# Patient Record
Sex: Male | Born: 1935 | Race: Black or African American | Hispanic: No | Marital: Single | Smoking: Former smoker
Health system: Southern US, Community
[De-identification: ages and names within clinical notes are randomized; demographics above are authoritative.]

## PROBLEM LIST (undated history)

## (undated) DIAGNOSIS — N529 Male erectile dysfunction, unspecified: Secondary | ICD-10-CM

## (undated) DIAGNOSIS — R569 Unspecified convulsions: Secondary | ICD-10-CM

## (undated) DIAGNOSIS — I2692 Saddle embolus of pulmonary artery without acute cor pulmonale: Secondary | ICD-10-CM

## (undated) DIAGNOSIS — G4700 Insomnia, unspecified: Secondary | ICD-10-CM

## (undated) DIAGNOSIS — I2699 Other pulmonary embolism without acute cor pulmonale: Secondary | ICD-10-CM

## (undated) DIAGNOSIS — E7800 Pure hypercholesterolemia, unspecified: Secondary | ICD-10-CM

## (undated) DIAGNOSIS — D649 Anemia, unspecified: Secondary | ICD-10-CM

## (undated) DIAGNOSIS — M542 Cervicalgia: Secondary | ICD-10-CM

## (undated) DIAGNOSIS — Z9889 Other specified postprocedural states: Secondary | ICD-10-CM

## (undated) DIAGNOSIS — C801 Malignant (primary) neoplasm, unspecified: Secondary | ICD-10-CM

## (undated) DIAGNOSIS — E119 Type 2 diabetes mellitus without complications: Secondary | ICD-10-CM

## (undated) DIAGNOSIS — I10 Essential (primary) hypertension: Secondary | ICD-10-CM

## (undated) HISTORY — DX: Essential (primary) hypertension: I10

## (undated) HISTORY — DX: Pure hypercholesterolemia, unspecified: E78.00

## (undated) HISTORY — DX: Type 2 diabetes mellitus without complications: E11.9

## (undated) HISTORY — DX: Other specified postprocedural states: Z98.890

## (undated) HISTORY — DX: Insomnia, unspecified: G47.00

## (undated) HISTORY — DX: Malignant (primary) neoplasm, unspecified: C80.1

## (undated) HISTORY — DX: Male erectile dysfunction, unspecified: N52.9

## (undated) HISTORY — DX: Saddle embolus of pulmonary artery without acute cor pulmonale: I26.92

## (undated) HISTORY — DX: Anemia, unspecified: D64.9

## (undated) HISTORY — DX: Cervicalgia: M54.2

---

## 2006-06-09 ENCOUNTER — Emergency Department (HOSPITAL_COMMUNITY): Admission: EM | Admit: 2006-06-09 | Discharge: 2006-06-09 | Payer: Self-pay | Admitting: Emergency Medicine

## 2008-03-25 ENCOUNTER — Encounter (HOSPITAL_COMMUNITY): Admission: RE | Admit: 2008-03-25 | Discharge: 2008-06-23 | Payer: Self-pay | Admitting: Urology

## 2008-04-08 ENCOUNTER — Ambulatory Visit: Admission: RE | Admit: 2008-04-08 | Discharge: 2008-06-30 | Payer: Self-pay | Admitting: Radiation Oncology

## 2008-06-30 ENCOUNTER — Ambulatory Visit: Admission: RE | Admit: 2008-06-30 | Discharge: 2008-09-18 | Payer: Self-pay | Admitting: Radiation Oncology

## 2011-01-17 ENCOUNTER — Inpatient Hospital Stay (HOSPITAL_COMMUNITY)
Admission: EM | Admit: 2011-01-17 | Discharge: 2011-01-19 | DRG: 101 | Disposition: A | Discharge status: Home / Self Care | Payer: Medicare Other | Attending: Internal Medicine | Admitting: Internal Medicine

## 2011-01-17 ENCOUNTER — Emergency Department (HOSPITAL_COMMUNITY): Payer: Medicare Other

## 2011-01-17 DIAGNOSIS — R569 Unspecified convulsions: Principal | ICD-10-CM | POA: Diagnosis present

## 2011-01-17 DIAGNOSIS — Z8546 Personal history of malignant neoplasm of prostate: Secondary | ICD-10-CM

## 2011-01-17 DIAGNOSIS — R946 Abnormal results of thyroid function studies: Secondary | ICD-10-CM | POA: Diagnosis present

## 2011-01-17 DIAGNOSIS — E871 Hypo-osmolality and hyponatremia: Secondary | ICD-10-CM | POA: Diagnosis present

## 2011-01-17 DIAGNOSIS — I10 Essential (primary) hypertension: Secondary | ICD-10-CM | POA: Diagnosis present

## 2011-01-17 LAB — COMPREHENSIVE METABOLIC PANEL
ALT: 25 U/L (ref 0–53)
AST: 31 U/L (ref 0–37)
CO2: 22 mEq/L (ref 19–32)
Calcium: 9.9 mg/dL (ref 8.4–10.5)
Chloride: 97 mEq/L (ref 96–112)
GFR calc non Af Amer: 56 mL/min — ABNORMAL LOW (ref >90)
Sodium: 133 mEq/L — ABNORMAL LOW (ref 135–145)

## 2011-01-17 LAB — DIFFERENTIAL
Basophils Absolute: 0 10*3/uL (ref 0.0–0.1)
Basophils Relative: 0 % (ref 0–1)
Eosinophils Absolute: 0 10*3/uL (ref 0.0–0.7)
Neutrophils Relative %: 90 % — ABNORMAL HIGH (ref 43–77)

## 2011-01-17 LAB — URINALYSIS, ROUTINE W REFLEX MICROSCOPIC
Leukocytes, UA: NEGATIVE
Nitrite: NEGATIVE
Specific Gravity, Urine: 1.018 (ref 1.005–1.030)
Urobilinogen, UA: 0.2 mg/dL (ref 0.0–1.0)

## 2011-01-17 LAB — PHOSPHORUS: Phosphorus: 3 mg/dL (ref 2.3–4.6)

## 2011-01-17 LAB — CBC
MCH: 31.7 pg (ref 26.0–34.0)
Platelets: 220 10*3/uL (ref 150–400)
RBC: 4.35 MIL/uL (ref 4.22–5.81)
WBC: 11.3 10*3/uL — ABNORMAL HIGH (ref 4.0–10.5)

## 2011-01-17 LAB — MAGNESIUM: Magnesium: 1.8 mg/dL (ref 1.5–2.5)

## 2011-01-17 LAB — CK TOTAL AND CKMB (NOT AT ARMC): Relative Index: 1.1 (ref 0.0–2.5)

## 2011-01-17 LAB — URINE MICROSCOPIC-ADD ON

## 2011-01-18 ENCOUNTER — Ambulatory Visit (HOSPITAL_COMMUNITY)
Admit: 2011-01-18 | Discharge: 2011-01-18 | Disposition: A | Discharge status: Home / Self Care | Payer: Medicare Other | Attending: Internal Medicine | Admitting: Internal Medicine

## 2011-01-18 ENCOUNTER — Inpatient Hospital Stay (HOSPITAL_COMMUNITY): Payer: Medicare Other

## 2011-01-18 LAB — BASIC METABOLIC PANEL
GFR calc Af Amer: 53 mL/min — ABNORMAL LOW (ref >90)
GFR calc non Af Amer: 46 mL/min — ABNORMAL LOW (ref >90)
Glucose, Bld: 95 mg/dL (ref 70–99)
Potassium: 3.7 mEq/L (ref 3.5–5.1)
Sodium: 134 mEq/L — ABNORMAL LOW (ref 135–145)

## 2011-01-18 LAB — RAPID URINE DRUG SCREEN, HOSP PERFORMED
Benzodiazepines: NOT DETECTED
Cocaine: NOT DETECTED
Opiates: NOT DETECTED

## 2011-01-18 LAB — LIPID PANEL
HDL: 50 mg/dL (ref >39)
LDL Cholesterol: 92 mg/dL (ref 0–99)
Triglycerides: 63 mg/dL (ref <150)
VLDL: 13 mg/dL (ref 0–40)

## 2011-01-18 LAB — PHOSPHORUS: Phosphorus: 2.9 mg/dL (ref 2.3–4.6)

## 2011-01-18 LAB — T3, FREE: T3, Free: 2.3 pg/mL (ref 2.3–4.2)

## 2011-01-18 LAB — MAGNESIUM: Magnesium: 1.8 mg/dL (ref 1.5–2.5)

## 2011-01-18 MED ORDER — IOHEXOL 300 MG/ML  SOLN
80.0000 mL | Freq: Once | INTRAMUSCULAR | Status: AC | PRN
  Administered 2011-01-18: 80 mL via INTRAVENOUS

## 2011-01-18 NOTE — H&P (Signed)
[name], [name]                    ACCOUNT NO.:  [id number]  MEDICAL RECORD NO.:  [id number]  LOCATION:  1440                         FACILITY:  [facility]  PHYSICIAN:  Kathlen Mody, MD       DATE OF BIRTH:  01-Jun-1934  DATE OF ADMISSION:  01/17/2011 DATE OF DISCHARGE:                             HISTORY & PHYSICAL   PRIMARY CARE PHYSICIAN:  MD at [facility].  CHIEF COMPLAINT:  Was brought in by her brother for a seizure activity this afternoon.  HISTORY OF PRESENT ILLNESS:  A 76 year old gentleman with history of hypertension, hyperlipidemia, prostate cancer, and an episode of the seizure-like activity about 3 years ago, was brought in by her brother for what might be a seizure activity.  The patient does not remember or does not know what has happened.  The brother is not at the bedside at this time, but as per the ER doc, the patient was sleeping this afternoon, had abnormal movements of the upper extremities and lower extremities and he bit his tongue, has small lacerations on his tongue and postictally he was confused, had a headache.  As per the patient, he remembers that he had a frontal headache.  His upper extremities have been sore.  He denies any weakness or any tingling or numbness.  Denies any blurry vision.  He denies any preceding seizures syndromes of nausea, vomiting, or abdominal pain.  Denies any chest pain, shortness of breath, or any syncopal episodes in the past.  When he had a seizure 3 years ago, he was worked up extensively and was discharged home.  His brother has a history of seizures and is on Dilantin for seizure activity.  In ED, the patient was found to be febrile with mild leukocytosis and hospitalist service was requested to admit the patient for evaluation and management of seizures with fever and slight confusion.  REVIEW OF SYSTEMS:  See HPI, otherwise negative.  PAST MEDICAL HISTORY:  Prostate cancer, hypertension,  hyperlipidemia, seizure episode about 3 years ago.  PAST SURGICAL HISTORY:  None.  SOCIAL HISTORY:  Denies smoking, EtOH, or recreational drug use.  Lives at home with his brother.  ALLERGIES:  No known drug allergies.  FAMILY HISTORY:  History of seizure activity in his brother on Dilantin.  HOME MEDICATIONS:  He is on Dyazide, Tylenol, and pravastatin.  PHYSICAL EXAMINATION:  VITAL SIGNS:  Temp of 101 when he came in, blood pressure 130/80, pulse of 110 to 100, respiratory rate 20 per minute, saturating 99% on room air. GENERAL:  On exam, he is alert, afebrile, oriented x3, comfortable in no acute distress.  HEENT:  Pupils equal and  reacting to light.  Dry mucous membranes.  No JVD.  No scleral icterus. CARDIOVASCULAR:  S1, S2 heard.  No murmurs, rubs, or gallops.  Slightly tachycardic. CHEST:  Clear to auscultation bilaterally.  No wheezing or rhonchi. ABDOMEN:  Soft, nontender, nondistended.  Bowel sounds are heard. EXTREMITIES:  No pedal edema, cyanosis, or clubbing. NEUROLOGICAL EXAM:  The patient is pleasantly confused.  He is alert, oriented to place and person, not to time.  He is aware that he had  a seizure episode similar to the 1 that he had 3 years ago.  No motor or sensory deficits.  EKG sinus tachycardia.  PERTINENT LABORATORY DATA:  A CBC done showed a mild elevation of the WBC count of 11.5, hemoglobin of 13.8, platelets 220.  Comprehensive metabolic panel significant for sodium of 133, glucose 130.  LFTs within normal limits.  Troponin negative.  Total CK slightly elevated 492. Urinalysis negative for nitrites and leukocytes with moderate blood and few bacteria.  RADIOLOGY:  CT head without contrast, no evidence of mass, hemorrhage or infarction chronic atrophy and periventricular white matter hypodensities consistent with white matter degeneration consistent with chronic microvascular ischemic changes, and stable. Two-view chest x-ray, no acute  abnormalities, mild tracheal deviation to the right of the superior mediastinum, mediastinal mass or adenopathy versus substernal extension of thyroid lobe, not exclude or recommend CT chest with contrast to exclude superior mediastinal mass/adenopathy.  ASSESSMENT AND PLAN:  A 76 year old gentleman admitted for so-called seizure episode witnessed by his brother,  proceeded with postictal confusion, headache with minute lacerations on the tongue.  It is unlikely to have a primary seizure disorder at the age of 70, but we will admit the patient to Tele, monitor him for the next 24-48 hours. Keep him on seizure precautions, IV Ativan 1-2 mg q.2 hours p.r.n. for any seizure activity.  Initial CT negative for acute changes.  We will get a EEG, RPR level.  Further recommendations as per the patient's clinical course.  Hyponatremia, mild, most likely secondary to thiazide diuretic.  We will hold the diuretics and monitor his electrolytes.  Hypertension ,controlled.  Continue with his home antihypertensives except for the thiazide diuretic.  Hyperlipidemia.  We will get a fasting lipid profile.  Hold the pravastatin for now for his elevated total CK.  Fever with no source of infection so far.  We will hold the antibiotics for now.  We will get a CT chest with contrast for the abnormal chest x- ray, and for the possible tracheal deviation to the right.  Deep vein thrombosis prophylaxis.  Subcutaneous Lovenox.  Patient is full code.          ______________________________ Kathlen Mody, MD     VA/MEDQ  D:  01/17/2011  T:  01/17/2011  Job:  [id number]  Electronically Signed by Kathlen Mody MD on 01/18/2011 11:13:23 PM

## 2011-01-18 NOTE — Procedures (Signed)
EEG NUMBER:  HISTORY:  A 76 year old male with this seizure-like activity.  MEDICATIONS:  Dyrenium, Lovenox, Ativan, Zofran, oxycodone, and Tylenol.  CONDITION OF RECORDING:  This is a 16 channel EEG carried out the patient in the awake state.  DESCRIPTION:  Muscular movement artifact are noted during the tracing. The waking background activity consists of a low-voltage, symmetrical, fairly well-organized 10-11 Hz alpha activity seen from the parietoccipital and posterotemporal regions.  Low-voltage, fast activity, poorly organized was seen anteriorly at times superimposed on more posterior rhythms.  A mixture of theta and alpha was seen from the central and temporal regions.  The patient does not drowse or sleep. Hypoventilation and intermittent photic stimulation were not performed. No epileptiform activity was noted.  IMPRESSION:  This is a normal awake EEG.  COMMENT:  An EEG, the patient is sleep deprived to elicit drowse and light sleep may be desirable to further elicit a possible seizure disorder.          ______________________________ Thana Farr, MD    [id number] D:  01/18/2011 17:34:17  T:  01/18/2011 19:08:00  Job #:  [id number]

## 2011-01-19 LAB — BASIC METABOLIC PANEL
Calcium: 9.4 mg/dL (ref 8.4–10.5)
Creatinine, Ser: 1.52 mg/dL — ABNORMAL HIGH (ref 0.50–1.35)
GFR calc Af Amer: 50 mL/min — ABNORMAL LOW (ref >90)
GFR calc non Af Amer: 43 mL/min — ABNORMAL LOW (ref >90)
Sodium: 134 mEq/L — ABNORMAL LOW (ref 135–145)

## 2011-01-19 LAB — CBC
MCH: 31.6 pg (ref 26.0–34.0)
MCHC: 33.3 g/dL (ref 30.0–36.0)
Platelets: 179 10*3/uL (ref 150–400)
RBC: 3.99 MIL/uL — ABNORMAL LOW (ref 4.22–5.81)
RDW: 12.7 % (ref 11.5–15.5)

## 2011-01-22 NOTE — Consult Note (Signed)
[name], [name] NO.:  [id number]  MEDICAL RECORD NO.:  [id number]  LOCATION:  EE                           FACILITY:  [facility]  PHYSICIAN:  Thana Farr, MD    DATE OF BIRTH:  [birth year]  DATE OF CONSULTATION:  01/18/2011 DATE OF DISCHARGE:  01/18/2011                                CONSULTATION   HISTORY:  [name] is a 76 year old male who lives with his brother.  It was noted by his brother while sleeping to have some jerking movements. Yates did bite his tongue.  There was no bowel or bladder incontinence.  Yates has had some confusion afterwards.  Was brought in for evaluation.  There was some question of whether [name] had seizure.  It seems that [name] had a similar episode approximately 4 years ago.  There is not much information available about this event. Brother is not available today.  ED note does not give much details. Yates is amnestic actually of both Anthony events.  He does not report any events that have occurred in between Anthony two.  PAST MEDICAL HISTORY: 1. Prostate cancer. 2. Hypertension. 3. Hyperlipidemia.  SOCIAL HISTORY:  [name] has no history of alcohol, tobacco, or illicit drug abuse.  He lives his brother.  FAMILY HISTORY:  Significant for brother that has seizures and is on Dilantin.  PHYSICAL EXAM:  VITAL SIGNS:  Blood pressure 135/71, heart rate 75, respiratory rate 18, T-max 99.9. MENTAL STATUS TESTING:  Yates is alert and oriented.  He can follow commands without difficulty.  Speech is fluent. CRANIAL NERVE TESTING:  II, visual fields full.  III, IV, and VI, extraocular movements are intact.  V and VII, smile symmetric.  VIII, grossly intact.  IX and X, positive gag.  XI, bilateral shoulder shrug. XII, midline tongue extension.  On motor exam, Yates has 5/5 Yates throughout.  There is normal tone and bulk.  Sensory, pinprick, and light touch are intact bilaterally.  Deep tendon reflexes are  1+ throughout, absent ankle jerks.  Plantar is upgoing on Anthony left and downgoing on Anthony right.  On cerebellar testing, finger-to-nose and heel- to-shin intact.  LABORATORY DATA:  Sodium of 134, potassium 3.7, chloride 101, bicarb 25, BUN 16, creatinine 1.46, glucose 95, calcium 9.3.  White blood cell count 11.3, platelet count 220, hemoglobin and hematocrit 13.8 and 40.8 respectively.  TSH 0.327.  Head CT was performed that shows small vessel ischemic changes, but no acute abnormalities were noted.  ASSESSMENT:  [name] is a 76 year old male with possible seizures.  He has had what was felt to be a seizure approximately 4 years ago and now one that possibly led to this admission.  He has not been on any antiepileptics in Anthony meantime.  Even if this does represent a seizure disorder, I would be somewhat reticent to start [name] on antiepileptics at this time since he has been symptom-free for 4 years.  Would, therefore, make determination as to whether Yates will be started on antiepileptics based on Anthony results of further testing.  PLAN: 1. EEG 2. MRI of Anthony brain without contrast. 3. Magnesium  and phosphorous to be drawn. 4. Once results reviewed, we will make further recommendations as to     Anthony need for antiepileptics.          ______________________________ Thana Farr, MD     LR/MEDQ  D:  01/18/2011  T:  01/19/2011  Job:  [id number]  Electronically Signed by Thana Farr MD on 01/22/2011 05:23:33 PM

## 2011-01-24 LAB — CULTURE, BLOOD (ROUTINE X 2)
Culture  Setup Time: 201210240020
Culture: NO GROWTH

## 2011-01-25 NOTE — Discharge Summary (Signed)
[name], [name]                    ACCOUNT NO.:  [id number]  MEDICAL RECORD NO.:  [id number]  LOCATION:  1440                         FACILITY:  [facility]  PHYSICIAN:  Hartley Barefoot, MD    DATE OF BIRTH:  [birth year]  DATE OF ADMISSION:  01/17/2011 DATE OF DISCHARGE:  01/19/2011                              DISCHARGE SUMMARY   DISCHARGE DIAGNOSES: 1. Questionable seizure. 2. Hypertension, uncontrolled. 3. Hyponatremia, resolved.  DISCHARGE MEDICATIONS: 1. Aspirin 81 mg p.o. daily. 2. Triamterene 50 mg 1/2 a tablet p.o. daily. 3. Pravastatin 40 mg p.o. daily. 4. Tylenol 325 two tablets every 4 hours as needed.  DISPOSITION AND FOLLOWUP:  [name] will need to follow up with [facility], appointment already arranged for November 13th at 9:30 a.m. He will need probably a prolonged EEG.  He is going to need to follow up with his primary care physician within a week to follow up kidney function.  His triamterene dose was reduced.  CONSULTANT:  Dr. Thad Ranger with Neurology.  STUDY PERFORMED: 1. EEG, sleep deprived to elicit drowse and sleep may be desired to     further elicit a possible seizure disorder. 2. MRI of the brain showed atrophy and a small-vessel disease.  No     acute intracranial finding.  No seizure focus identified.  Suspect     moderately severe cervical spondylosis. 3. CT chest.  No acute finding in the chest.  Incidental imaging of     the upper abdomen shows subcentimeter low attenuation lesion in the     liver, which is too small to characterize.  Minimal biapical     pleural parenchymal scarring.  Maybe a tiny calcified granuloma.  A     2-mm subpleural lymph node is also seen around the minor fissure.     Maybe a base subpleural lymph node. 4. Chest x-ray on October 23rd, no acute intrathoracic abnormality.     Mild tracheal deviation to the left, to the right and superior     mediastinum.  Mediastinal mass, adenopathy, and substernal     extension of  the thyroid is not excluded.  Recommend CT, chest with     contrast to exclude a superior mediastinal mass or adenopathy.  BRIEF HISTORY OF PRESENT ILLNESS:  This is a very pleasant 76 year old African American with history of hypertension, hyperlipidemia, prostate cancer, possible seizure episode 4 years ago; presented due to confusion episode.  His brother found him in the room and he was confused, his speech was drawn.  The brother did not witness any seizure activity.  HOSPITAL COURSE: 1. Questionable seizure.  The patient was admitted to telemetry.  No     arrhythmia.  He had mild elevated total CK.  He did not have any     seizure episode during this admission.  MRI was negative for acute     stroke.  EEG was inconclusive.  Neurology was consulted.  They     recommended to follow up for a prolonged sleep study.  No     significant electrolyte abnormalities. 2. Hypertension.  We will continue with spironolactone with     triamterene.  We will discontinue the hydrochlorothiazide due to     hyponatremia.  He will need to follow up with his primary care     physician to follow kidney function.  Creatinine at 1.5.  Dose of     his blood pressure medication was decreased. 3. Low TSH with normal T3, T4; subclinical thyroidism.  He will need     to have a repeated TSH within 4 weeks. Abnormal chest x-ray.  CT     was negative.  He has some subpleural nodes.  This will need to be     followed by his primary care physician.  On the day of discharge, the patient was in a stable condition.  Blood pressure 117/72, saturating 96% on room air, temp 98.7, pulse 85, respirations 18.  Blood cultures negative.  Sodium 134, potassium 3.6, chloride 100, bicarbonate 25, glucose 114, BUN 20, creatinine 1.5. White blood cell 6.2, hemoglobin 12, platelets 179.     Hartley Barefoot, MD     BR/MEDQ  D:  01/19/2011  T:  01/19/2011  Job:  [id number]  cc:   [facility]  [facility]  Neurology  Electronically Signed by Hartley Barefoot MD on 01/25/2011 08:37:30 PM

## 2011-02-07 ENCOUNTER — Ambulatory Visit: Payer: Medicare Other | Admitting: Neurology

## 2011-04-05 DIAGNOSIS — E7800 Pure hypercholesterolemia, unspecified: Secondary | ICD-10-CM | POA: Diagnosis not present

## 2011-04-05 DIAGNOSIS — E119 Type 2 diabetes mellitus without complications: Secondary | ICD-10-CM | POA: Diagnosis not present

## 2011-04-05 DIAGNOSIS — I10 Essential (primary) hypertension: Secondary | ICD-10-CM | POA: Diagnosis not present

## 2011-04-05 DIAGNOSIS — N529 Male erectile dysfunction, unspecified: Secondary | ICD-10-CM | POA: Diagnosis not present

## 2011-04-05 DIAGNOSIS — Z79899 Other long term (current) drug therapy: Secondary | ICD-10-CM | POA: Diagnosis not present

## 2011-05-03 DIAGNOSIS — Z79899 Other long term (current) drug therapy: Secondary | ICD-10-CM | POA: Diagnosis not present

## 2011-05-03 DIAGNOSIS — I10 Essential (primary) hypertension: Secondary | ICD-10-CM | POA: Diagnosis not present

## 2011-09-07 DIAGNOSIS — N529 Male erectile dysfunction, unspecified: Secondary | ICD-10-CM | POA: Diagnosis not present

## 2011-09-07 DIAGNOSIS — E7800 Pure hypercholesterolemia, unspecified: Secondary | ICD-10-CM | POA: Diagnosis not present

## 2011-09-07 DIAGNOSIS — I10 Essential (primary) hypertension: Secondary | ICD-10-CM | POA: Diagnosis not present

## 2011-09-07 DIAGNOSIS — E119 Type 2 diabetes mellitus without complications: Secondary | ICD-10-CM | POA: Diagnosis not present

## 2012-01-12 DIAGNOSIS — E119 Type 2 diabetes mellitus without complications: Secondary | ICD-10-CM | POA: Diagnosis not present

## 2012-01-12 DIAGNOSIS — N529 Male erectile dysfunction, unspecified: Secondary | ICD-10-CM | POA: Diagnosis not present

## 2012-01-12 DIAGNOSIS — I10 Essential (primary) hypertension: Secondary | ICD-10-CM | POA: Diagnosis not present

## 2012-01-12 DIAGNOSIS — E7800 Pure hypercholesterolemia, unspecified: Secondary | ICD-10-CM | POA: Diagnosis not present

## 2012-05-20 DIAGNOSIS — E119 Type 2 diabetes mellitus without complications: Secondary | ICD-10-CM | POA: Diagnosis not present

## 2012-05-20 DIAGNOSIS — I10 Essential (primary) hypertension: Secondary | ICD-10-CM | POA: Diagnosis not present

## 2012-05-20 DIAGNOSIS — Z6828 Body mass index (BMI) 28.0-28.9, adult: Secondary | ICD-10-CM | POA: Diagnosis not present

## 2012-05-20 DIAGNOSIS — E7800 Pure hypercholesterolemia, unspecified: Secondary | ICD-10-CM | POA: Diagnosis not present

## 2012-05-20 DIAGNOSIS — C61 Malignant neoplasm of prostate: Secondary | ICD-10-CM | POA: Diagnosis not present

## 2012-08-01 DIAGNOSIS — N401 Enlarged prostate with lower urinary tract symptoms: Secondary | ICD-10-CM | POA: Diagnosis not present

## 2012-08-01 DIAGNOSIS — C61 Malignant neoplasm of prostate: Secondary | ICD-10-CM | POA: Diagnosis not present

## 2012-08-23 DIAGNOSIS — Z9181 History of falling: Secondary | ICD-10-CM | POA: Diagnosis not present

## 2012-08-23 DIAGNOSIS — S46819A Strain of other muscles, fascia and tendons at shoulder and upper arm level, unspecified arm, initial encounter: Secondary | ICD-10-CM | POA: Diagnosis not present

## 2012-08-23 DIAGNOSIS — S43499A Other sprain of unspecified shoulder joint, initial encounter: Secondary | ICD-10-CM | POA: Diagnosis not present

## 2012-08-23 DIAGNOSIS — R569 Unspecified convulsions: Secondary | ICD-10-CM | POA: Diagnosis not present

## 2012-09-09 ENCOUNTER — Encounter: Payer: Self-pay | Admitting: Nurse Practitioner

## 2012-09-10 ENCOUNTER — Ambulatory Visit (INDEPENDENT_AMBULATORY_CARE_PROVIDER_SITE_OTHER): Payer: Medicare Other | Admitting: Neurology

## 2012-09-10 ENCOUNTER — Encounter: Payer: Self-pay | Admitting: Neurology

## 2012-09-10 VITALS — BP 175/95 | HR 84 | Temp 98.0°F | Ht 72.0 in | Wt 204.0 lb

## 2012-09-10 DIAGNOSIS — R569 Unspecified convulsions: Secondary | ICD-10-CM | POA: Diagnosis not present

## 2012-09-10 MED ORDER — LEVETIRACETAM 500 MG PO TABS: 500.0000 mg | ORAL_TABLET | Freq: Two times a day (BID) | ORAL | Status: DC

## 2012-09-10 NOTE — Progress Notes (Signed)
History of present illness: Mr. [name] is a 78 year old right-handed African American male, referred by his primary care physician Dr. Burnell Blanks for evaluation of seizure  He had a past medical history of hypertension, hyperlipidemia, diabetes,   He woke up one day in May 2014, chewed his tongue, achy all over, was told he had a seizure, he lives with his brother, who also suffered complex partial [name], [name] ( Feb 9th 1939) is a patient of ours  This is not his first episode, initial one was in 2009, he woke up on his way to hospital on the ambulance, he was found by his brother having a seizure,  He also had one minor episode in April 2014, he woke up with tongue biting, suspicious he might have a seizure as well,  He never had daytime passing out, No history of head trauma, or stroke,  MRI of the brain in December 2012 without contrast for evaluation of seizure-like activity, reported atrophy, small vessel disease, no contrast was given,  He is now back to his baseline, denied visual change, no lateralized motor or sensory deficit  Review of Systems  Out of a complete 14 system review, the patient complains of only the following symptoms, and all other reviewed systems are negative.   Constitutional:   N/A Cardiovascular:  N/A Ear/Nose/Throat:  N/A Skin: N/A Eyes: Blurred vision, Respiratory: Wheezing Gastroitestinal: N/A    Hematology/Lymphatic:  N/A Endocrine:  N/A Musculoskeletal:N/A Allergy/Immunology: N/A Neurological: Seizure, snoring, Psychiatric:    N/A  PHYSICAL EXAMINATOINS:  Generalized: In no acute distress  Neck: Supple, no carotid bruits   Cardiac: Regular rate rhythm  Pulmonary: Clear to auscultation bilaterally  Musculoskeletal: No deformity  Neurological examination  Mentation: Alert oriented to time, place, history taking, and causual conversation  Cranial nerve II-XII: Pupils were equal round reactive to light extraocular movements were  full, visual field were full on confrontational test. facial sensation and strength were normal. hearing was intact to finger rubbing bilaterally. Uvula tongue midline.  head turning and shoulder shrug and were normal and symmetric.Tongue protrusion into cheek strength was normal.  Motor: normal tone, bulk and strength.  Sensory: Intact to fine touch, pinprick, preserved vibratory sensation, and proprioception at toes.  Coordination: Normal finger to nose, heel-to-shin bilaterally there was no truncal ataxia  Gait: Rising up from seated position without assistance, normal stance, without trunk ataxia, moderate stride, good arm swing, smooth turning, able to perform tiptoe, and heel walking. He could no do tandem walking.  Romberg signs: Negative  Deep tendon reflexes: Brachioradialis 2/2, biceps 2/2, triceps 2/2, patellar 2/2, Achilles 2/2, plantar responses were flexor bilaterally.  Assessment and plan: 78 year old right-handed [place] American male, with past medical history of nocturnal seizure, his brother also suffered complex partial seizure,  1. Complete evaluation with MRI of the brain with and without contrast 2.EEG . 3. Keppra 500 mg twice a day, no driving until seizure free for 6 months 4 .Return to clinic with Eber Jones in 6 months

## 2012-09-20 ENCOUNTER — Other Ambulatory Visit: Payer: Self-pay | Admitting: Orthopedic Surgery

## 2012-09-26 ENCOUNTER — Ambulatory Visit
Admission: RE | Admit: 2012-09-26 | Discharge: 2012-09-26 | Disposition: A | Discharge status: Home / Self Care | Payer: Medicare Other | Source: Ambulatory Visit | Attending: Neurology | Admitting: Neurology

## 2012-09-26 DIAGNOSIS — R569 Unspecified convulsions: Secondary | ICD-10-CM

## 2012-09-26 MED ORDER — GADOBENATE DIMEGLUMINE 529 MG/ML IV SOLN
19.0000 mL | Freq: Once | INTRAVENOUS | Status: AC | PRN
  Administered 2012-09-26: 19 mL via INTRAVENOUS

## 2012-10-02 NOTE — Progress Notes (Signed)
Quick Note:  I called and gave results of MRI to pt. (age related changes). No change in treatment. Renae Fickle, brother, who lives with pt, given the results and will relay to pt. ______

## 2012-10-02 NOTE — Progress Notes (Signed)
Quick Note:  Please call patient, MRI brain showed age related changes. No acute lesions, no change in treatment plan ______

## 2012-10-03 DIAGNOSIS — E7800 Pure hypercholesterolemia, unspecified: Secondary | ICD-10-CM | POA: Diagnosis not present

## 2012-10-03 DIAGNOSIS — E119 Type 2 diabetes mellitus without complications: Secondary | ICD-10-CM | POA: Diagnosis not present

## 2012-10-03 DIAGNOSIS — I10 Essential (primary) hypertension: Secondary | ICD-10-CM | POA: Diagnosis not present

## 2012-10-18 ENCOUNTER — Telehealth: Payer: Self-pay | Admitting: Neurology

## 2012-10-18 DIAGNOSIS — R569 Unspecified convulsions: Secondary | ICD-10-CM

## 2012-10-21 NOTE — Telephone Encounter (Signed)
I spoke with the patient.  He says the Levetiracetam is about $80 per month and he would like to know if he can just go on Phenytoin.  Says that is what his brother takes for the same symptoms and it costs much less.  He asked why the same med was not prescribed for him.  I advised him each person is different and the MD prescribes what they feel will best treat each individual.  He would like a message sent to the prescriber asking for a drug change to Phenytoin instead.   Dr Terrace Arabia, Please advise.  Thank you.

## 2012-10-23 MED ORDER — PHENYTOIN SODIUM EXTENDED 100 MG PO CAPS: 300.0000 mg | ORAL_CAPSULE | Freq: Every day | ORAL | Status: DC

## 2012-10-23 NOTE — Addendum Note (Signed)
Addended by: Levert Feinstein on: 10/23/2012 01:39 PM   Modules accepted: Orders

## 2012-10-23 NOTE — Telephone Encounter (Addendum)
Please call back patient, I have called in Dilantin 100mg  3 tabs every night for him

## 2012-10-23 NOTE — Telephone Encounter (Signed)
I fail to reach him, could not leave a message either.

## 2012-10-24 NOTE — Telephone Encounter (Signed)
I spoke to patient and relayed that Dr. Terrace Arabia called in Dilantin 100mg  3 tabs every night.

## 2013-02-05 DIAGNOSIS — Z9181 History of falling: Secondary | ICD-10-CM | POA: Diagnosis not present

## 2013-02-05 DIAGNOSIS — I10 Essential (primary) hypertension: Secondary | ICD-10-CM | POA: Diagnosis not present

## 2013-02-05 DIAGNOSIS — E7800 Pure hypercholesterolemia, unspecified: Secondary | ICD-10-CM | POA: Diagnosis not present

## 2013-02-05 DIAGNOSIS — C61 Malignant neoplasm of prostate: Secondary | ICD-10-CM | POA: Diagnosis not present

## 2013-02-05 DIAGNOSIS — E119 Type 2 diabetes mellitus without complications: Secondary | ICD-10-CM | POA: Diagnosis not present

## 2013-05-20 ENCOUNTER — Telehealth: Payer: Self-pay | Admitting: Neurology

## 2013-05-20 DIAGNOSIS — R569 Unspecified convulsions: Secondary | ICD-10-CM

## 2013-05-20 MED ORDER — PHENYTOIN SODIUM EXTENDED 100 MG PO CAPS: ORAL_CAPSULE | ORAL | Status: DC

## 2013-05-20 NOTE — Telephone Encounter (Signed)
Calling wanting to speak to Dr. Krista Blue. States the Dilantin is not helping his brother he is still having seizures.

## 2013-05-20 NOTE — Telephone Encounter (Signed)
I have called [name],   He had seizure last night, tongue was swelling.  He has been compliant with his medication, he could not afford Keppra because of the higher co-pay  I will increase Dilantin 100mg  3 tabs, one po qam. check Dilantin level, trough level, before morning dose

## 2013-05-21 ENCOUNTER — Ambulatory Visit: Payer: Self-pay | Admitting: Neurology

## 2013-06-06 DIAGNOSIS — I10 Essential (primary) hypertension: Secondary | ICD-10-CM | POA: Diagnosis not present

## 2013-06-06 DIAGNOSIS — Z79899 Other long term (current) drug therapy: Secondary | ICD-10-CM | POA: Diagnosis not present

## 2013-06-06 DIAGNOSIS — E7800 Pure hypercholesterolemia, unspecified: Secondary | ICD-10-CM | POA: Diagnosis not present

## 2013-06-06 DIAGNOSIS — E119 Type 2 diabetes mellitus without complications: Secondary | ICD-10-CM | POA: Diagnosis not present

## 2013-06-06 DIAGNOSIS — N529 Male erectile dysfunction, unspecified: Secondary | ICD-10-CM | POA: Diagnosis not present

## 2013-10-17 ENCOUNTER — Encounter (HOSPITAL_COMMUNITY): Payer: Self-pay | Admitting: Emergency Medicine

## 2013-10-17 ENCOUNTER — Emergency Department (HOSPITAL_COMMUNITY): Payer: Medicare Other

## 2013-10-17 ENCOUNTER — Emergency Department (HOSPITAL_COMMUNITY)
Admission: EM | Admit: 2013-10-17 | Discharge: 2013-10-17 | Disposition: A | Discharge status: Home / Self Care | Payer: Medicare Other | Attending: Emergency Medicine | Admitting: Emergency Medicine

## 2013-10-17 DIAGNOSIS — E7800 Pure hypercholesterolemia, unspecified: Secondary | ICD-10-CM | POA: Diagnosis not present

## 2013-10-17 DIAGNOSIS — N529 Male erectile dysfunction, unspecified: Secondary | ICD-10-CM | POA: Insufficient documentation

## 2013-10-17 DIAGNOSIS — R000 Tachycardia, unspecified: Secondary | ICD-10-CM | POA: Diagnosis not present

## 2013-10-17 DIAGNOSIS — E119 Type 2 diabetes mellitus without complications: Secondary | ICD-10-CM | POA: Insufficient documentation

## 2013-10-17 DIAGNOSIS — R112 Nausea with vomiting, unspecified: Secondary | ICD-10-CM | POA: Diagnosis not present

## 2013-10-17 DIAGNOSIS — I10 Essential (primary) hypertension: Secondary | ICD-10-CM | POA: Diagnosis not present

## 2013-10-17 DIAGNOSIS — R51 Headache: Secondary | ICD-10-CM | POA: Insufficient documentation

## 2013-10-17 DIAGNOSIS — M542 Cervicalgia: Secondary | ICD-10-CM | POA: Diagnosis not present

## 2013-10-17 DIAGNOSIS — Z8546 Personal history of malignant neoplasm of prostate: Secondary | ICD-10-CM | POA: Diagnosis not present

## 2013-10-17 DIAGNOSIS — Z87891 Personal history of nicotine dependence: Secondary | ICD-10-CM | POA: Diagnosis not present

## 2013-10-17 DIAGNOSIS — R55 Syncope and collapse: Secondary | ICD-10-CM | POA: Insufficient documentation

## 2013-10-17 DIAGNOSIS — R404 Transient alteration of awareness: Secondary | ICD-10-CM | POA: Diagnosis not present

## 2013-10-17 DIAGNOSIS — Z79899 Other long term (current) drug therapy: Secondary | ICD-10-CM | POA: Diagnosis not present

## 2013-10-17 LAB — I-STAT TROPONIN, ED: TROPONIN I, POC: 0.08 ng/mL (ref 0.00–0.08)

## 2013-10-17 LAB — BASIC METABOLIC PANEL
ANION GAP: 17 — AB (ref 5–15)
BUN: 14 mg/dL (ref 6–23)
CALCIUM: 9.1 mg/dL (ref 8.4–10.5)
CO2: 23 mEq/L (ref 19–32)
Chloride: 100 mEq/L (ref 96–112)
Creatinine, Ser: 1.12 mg/dL (ref 0.50–1.35)
GFR, EST AFRICAN AMERICAN: 71 mL/min — AB (ref >90)
GFR, EST NON AFRICAN AMERICAN: 61 mL/min — AB (ref >90)
Glucose, Bld: 195 mg/dL — ABNORMAL HIGH (ref 70–99)
POTASSIUM: 4.5 meq/L (ref 3.7–5.3)
SODIUM: 140 meq/L (ref 137–147)

## 2013-10-17 LAB — CBC
HCT: 43.6 % (ref 39.0–52.0)
Hemoglobin: 14.3 g/dL (ref 13.0–17.0)
MCH: 32.4 pg (ref 26.0–34.0)
MCHC: 32.8 g/dL (ref 30.0–36.0)
MCV: 98.9 fL (ref 78.0–100.0)
PLATELETS: 156 10*3/uL (ref 150–400)
RBC: 4.41 MIL/uL (ref 4.22–5.81)
RDW: 13 % (ref 11.5–15.5)
WBC: 8.3 10*3/uL (ref 4.0–10.5)

## 2013-10-17 LAB — URINALYSIS, ROUTINE W REFLEX MICROSCOPIC
Glucose, UA: NEGATIVE mg/dL
Hgb urine dipstick: NEGATIVE
KETONES UR: 15 mg/dL — AB
LEUKOCYTES UA: NEGATIVE
Nitrite: NEGATIVE
PROTEIN: 100 mg/dL — AB
Specific Gravity, Urine: 1.03 (ref 1.005–1.030)
UROBILINOGEN UA: 1 mg/dL (ref 0.0–1.0)
pH: 6 (ref 5.0–8.0)

## 2013-10-17 LAB — URINE MICROSCOPIC-ADD ON

## 2013-10-17 LAB — CBG MONITORING, ED: GLUCOSE-CAPILLARY: 185 mg/dL — AB (ref 70–99)

## 2013-10-17 LAB — PHENYTOIN LEVEL, TOTAL: Phenytoin Lvl: 8.9 ug/mL — ABNORMAL LOW (ref 10.0–20.0)

## 2013-10-17 MED ORDER — KETOROLAC TROMETHAMINE 30 MG/ML IJ SOLN
30.0000 mg | Freq: Once | INTRAMUSCULAR | Status: AC
  Administered 2013-10-17: 30 mg via INTRAVENOUS
  Filled 2013-10-17: qty 1

## 2013-10-17 MED ORDER — SODIUM CHLORIDE 0.9 % IV BOLUS (SEPSIS)
1000.0000 mL | Freq: Once | INTRAVENOUS | Status: AC
  Administered 2013-10-17: 1000 mL via INTRAVENOUS

## 2013-10-17 NOTE — ED Notes (Signed)
Pt was able to ambulate with no problem

## 2013-10-17 NOTE — ED Notes (Signed)
Patient transported to CT 

## 2013-10-17 NOTE — ED Notes (Signed)
Pt reports that he had a bad headache Saturday morning. Pt c/o 5/10 headache pain. Pt denies blurred vision, dizziness/lightheadness.

## 2013-10-17 NOTE — ED Notes (Signed)
Pt ambulating independently w/ steady gait on d/c in no acute distress, A&Ox4. D/c instructions reviewed w/ pt - pt denies any further questions or concerns at present.  

## 2013-10-17 NOTE — ED Notes (Signed)
Pt stated he has home meds to take.  He was informed to not take them.

## 2013-10-17 NOTE — ED Provider Notes (Signed)
CSN: [id number]     Arrival date & time 10/17/13  1629 History   First MD Initiated Contact with Patient 10/17/13 1648     Chief Complaint  Patient presents with  . Loss of Consciousness     (Consider location/radiation/quality/duration/timing/severity/associated sxs/prior Treatment) Patient is a 79 y.o. male presenting with syncope.  Loss of Consciousness Episode history:  Single Most recent episode:  Today Timing:  Unable to specify Progression:  Resolved Chronicity:  New Context: standing up   Witnessed: no   Relieved by:  Bed rest Associated symptoms: headaches (ongoing x1 week)   Associated symptoms: no chest pain, no fever, no nausea, no seizures, no shortness of breath and no vomiting     Past Medical History  Diagnosis Date  . Type II or unspecified type diabetes mellitus without mention of complication, not stated as uncontrolled   . Impotence of organic origin   . Cervicalgia   . Pure hypercholesterolemia   . Essential hypertension, benign   . Cancer     Prostate   History reviewed. No pertinent past surgical history. Family History  Problem Relation Age of Onset  . Cancer Mother   . Cancer Father    History  Substance Use Topics  . Smoking status: Former Research scientist (life sciences)  . Smokeless tobacco: Never Used  . Alcohol Use: Not on file    Review of Systems  Constitutional: Negative for fever and chills.  HENT: Negative for sore throat.   Eyes: Negative for pain.  Respiratory: Negative for cough and shortness of breath.   Cardiovascular: Positive for syncope. Negative for chest pain.  Gastrointestinal: Negative for nausea, vomiting and abdominal pain.  Genitourinary: Negative for dysuria and flank pain.  Musculoskeletal: Negative for back pain and neck pain.  Skin: Negative for rash.  Neurological: Positive for headaches (ongoing x1 week). Negative for seizures.      Allergies  Review of patient's allergies indicates no known allergies.  Home Medications    Prior to Admission medications   Medication Sig Start Date End Date Taking? Authorizing Provider  acetaminophen (TYLENOL) 325 MG tablet Take 650 mg by mouth every 4 (four) hours as needed for pain.   Yes Historical Provider, MD  lisinopril (PRINIVIL,ZESTRIL) 30 MG tablet Take 30 mg by mouth daily.   Yes Historical Provider, MD  phenytoin (DILANTIN) 100 MG ER capsule Take 100-300 mg by mouth 2 (two) times daily. 100mg  in the morning and 300mg  at bedtime   Yes Historical Provider, MD  pravastatin (PRAVACHOL) 40 MG tablet Take 40 mg by mouth at bedtime.   Yes Historical Provider, MD  glucose blood (FREESTYLE LITE) test strip 1 each by Other route daily. Use as instructed    Historical Provider, MD  Lancets MISC by Does not apply route.    Historical Provider, MD  vardenafil (LEVITRA) 20 MG tablet Take 20 mg by mouth daily as needed for erectile dysfunction (1 hour before intercourse).    Historical Provider, MD   BP 124/85  Pulse 106  Temp(Src) 97.7 F (36.5 C) (Oral)  Resp 18  SpO2 98% Physical Exam  Constitutional: He is oriented to person, place, and time. He appears well-developed and well-nourished. No distress.  HENT:  Head: Normocephalic and atraumatic.  Eyes: Pupils are equal, round, and reactive to light.  Neck: Normal range of motion.  Cardiovascular: Normal rate and regular rhythm.   Pulmonary/Chest: Effort normal and breath sounds normal.  Abdominal: Soft. He exhibits no distension. There is no tenderness.  Musculoskeletal: Normal  range of motion.  Neurological: He is alert and oriented to person, place, and time. He has normal strength. No cranial nerve deficit or sensory deficit. Coordination and gait normal. GCS eye subscore is 4. GCS verbal subscore is 5. GCS motor subscore is 6.  Skin: Skin is warm. He is not diaphoretic.    ED Course  Procedures (including critical care time) Labs Review Labs Reviewed  BASIC METABOLIC PANEL - Abnormal; Notable for the following:     Glucose, Bld 195 (*)    GFR calc non Af Amer 61 (*)    GFR calc Af Amer 71 (*)    Anion gap 17 (*)    All other components within normal limits  URINALYSIS, ROUTINE W REFLEX MICROSCOPIC - Abnormal; Notable for the following:    Color, Urine AMBER (*)    APPearance CLOUDY (*)    Bilirubin Urine SMALL (*)    Ketones, ur 15 (*)    Protein, ur 100 (*)    All other components within normal limits  PHENYTOIN LEVEL, TOTAL - Abnormal; Notable for the following:    Phenytoin Lvl 8.9 (*)    All other components within normal limits  URINE MICROSCOPIC-ADD ON - Abnormal; Notable for the following:    Casts GRANULAR CAST (*)    All other components within normal limits  CBG MONITORING, ED - Abnormal; Notable for the following:    Glucose-Capillary 185 (*)    All other components within normal limits  CBC  I-STAT TROPOININ, ED    Imaging Review Ct Head Wo Contrast  10/17/2013   CLINICAL DATA:  Syncopal episode.  EXAM: CT HEAD WITHOUT CONTRAST  TECHNIQUE: Contiguous axial images were obtained from the base of the skull through the vertex without intravenous contrast.  COMPARISON:  PET-CT from 01/17/2011.  FINDINGS: There is no evidence for acute hemorrhage, hydrocephalus, mass lesion, or abnormal extra-axial fluid collection. No definite CT evidence for acute infarction. Diffuse loss of parenchymal volume is consistent with atrophy. Patchy low attenuation in the deep hemispheric and periventricular white matter is nonspecific, but likely reflects chronic microvascular ischemic demyelination. The visualized paranasal sinuses and mastoid air cells are clear.  IMPRESSION: 1. No acute intracranial abnormality. 2. Atrophy with chronic small vessel white matter ischemic demyelination.   Electronically Signed   By: Misty Stanley M.D.   On: 10/17/2013 20:51     EKG Interpretation None      MDM   Final diagnoses:  Syncope, unspecified syncope type   79 year old male with a history of diabetes,  hypertension, hyperlipidemia, who presents today for a syncopal episode at home. This patient was not witnessed by the brother, but brother heard him call and called EMS. According to EMS, the patient was diaphoretic and nauseated and stated that he was "not feeling well." Emesis times one on seen.  On arrival here patient is hemodynamically stable. Patient has tachycardia in the 100s to 110s. Patient is stable and well-appearing. Patient with a mild headache which is been ongoing for approximately 5 days. Plan is to treat patient with IV fluids, monitor the patient for several hours.  Basic labs as above demonstrates no acute findings. Patient is normal hematocrit. Patient denied a history of CHF. EKG with no ischemic findings in this time. Phenytoin level was checked given the patient's seizure history. Phenytoin level is mildly decreased. Patient denies any missed doses.  Patient stable appearing and requests discharge. Strict return precautions were given. Patient instructed to followup with his primary care  physician and with his neurologist. Patient voices understanding. Patient discharged in stable condition. Patient seen and evaluated by myself and by the attending Dr. Stark Jock.   Freddi Che, MD 10/17/13 [phone]

## 2013-10-17 NOTE — ED Notes (Addendum)
Per EMS, pt had a syncopal episode at home. Brother called. Fire on scene, pt ambulatory to ambulance. Pt A&O x3. Pt NAD. Pt was very diaphoretic. Pt c/o nausea and "not feeling well." Pt vomitted x1. Pt denied pain. Initial BP 160/100, P 120, CBG 150, R16, 92-96% RmAir to 2LO2 via Nasal cannula. No meds given. IV started, 18 RFA.

## 2013-10-18 NOTE — ED Provider Notes (Signed)
I saw and evaluated the patient, reviewed the resident's note and I agree with the findings and plan. Patient is a 79 year old male who presents after an apparent syncopal episode. He states he got up to come out of the bathroom he became nauseated and sweaty. He then passed out and fell the floor. His symptoms resolved prior to arrival. He is currently without complaint. He is having no chest pain, difficulty breathing, but does admit to a mild headache. He states his headache has been going on for approximately 5 days.  On exam, vitals are stable and the patient is afebrile. Head is atraumatic, normocephalic. Neck is supple. Heart is regular rate and rhythm without murmurs. Lungs are clear and equal bilaterally. Abdomen is soft, nontender. Neurologically cranial nerves II through XII are grossly intact without focal deficits. Extremities are without edema.  Workup reveals an unremarkable EKG and laboratory studies. A CT scan of his head was obtained which was unremarkable. Dilantin level was slightly subtherapeutic. He was observed for several hours and was feeling well. He was tachycardic with a heart rate of 110. He was given IV fluids and his tachycardia significantly improved. He is been ambulatory in the emergency department and is anxious to go home. He will be discharged with instructions to followup with his primary doctor and return if he develops any recurrence.   EKG Interpretation   Date/Time:  Friday October 17 2013 16:38:33 EDT Ventricular Rate:  120 PR Interval:  208 QRS Duration: 83 QT Interval:  344 QTC Calculation: 486 R Axis:   42 Text Interpretation:  Sinus tachycardia Ventricular premature complex  Borderline prolonged PR interval Anterior infarct, old Nonspecific T  abnormalities, lateral leads Confirmed by Beau Fanny  MD, Breckyn Troyer ([id number]) on  10/18/2013 5:27:30 PM        Veryl Speak, MD 10/18/13 1727

## 2013-10-24 DIAGNOSIS — R55 Syncope and collapse: Secondary | ICD-10-CM | POA: Diagnosis not present

## 2013-10-24 DIAGNOSIS — E7800 Pure hypercholesterolemia, unspecified: Secondary | ICD-10-CM | POA: Diagnosis not present

## 2013-10-24 DIAGNOSIS — E119 Type 2 diabetes mellitus without complications: Secondary | ICD-10-CM | POA: Diagnosis not present

## 2013-10-24 DIAGNOSIS — Z79899 Other long term (current) drug therapy: Secondary | ICD-10-CM | POA: Diagnosis not present

## 2013-10-24 DIAGNOSIS — I10 Essential (primary) hypertension: Secondary | ICD-10-CM | POA: Diagnosis not present

## 2013-10-24 DIAGNOSIS — R0609 Other forms of dyspnea: Secondary | ICD-10-CM | POA: Diagnosis not present

## 2013-10-25 ENCOUNTER — Emergency Department (HOSPITAL_COMMUNITY): Payer: Medicare Other

## 2013-10-25 ENCOUNTER — Encounter (HOSPITAL_COMMUNITY): Payer: Self-pay | Admitting: Emergency Medicine

## 2013-10-25 ENCOUNTER — Inpatient Hospital Stay (HOSPITAL_COMMUNITY): Payer: Medicare Other

## 2013-10-25 ENCOUNTER — Inpatient Hospital Stay (HOSPITAL_COMMUNITY)
Admission: EM | Admit: 2013-10-25 | Discharge: 2013-11-01 | DRG: 176 | Disposition: A | Discharge status: Home / Self Care | Payer: Medicare Other | Attending: Internal Medicine | Admitting: Internal Medicine

## 2013-10-25 DIAGNOSIS — I10 Essential (primary) hypertension: Secondary | ICD-10-CM

## 2013-10-25 DIAGNOSIS — E7800 Pure hypercholesterolemia, unspecified: Secondary | ICD-10-CM | POA: Diagnosis present

## 2013-10-25 DIAGNOSIS — I2692 Saddle embolus of pulmonary artery without acute cor pulmonale: Secondary | ICD-10-CM | POA: Diagnosis not present

## 2013-10-25 DIAGNOSIS — E119 Type 2 diabetes mellitus without complications: Secondary | ICD-10-CM

## 2013-10-25 DIAGNOSIS — R319 Hematuria, unspecified: Secondary | ICD-10-CM | POA: Diagnosis not present

## 2013-10-25 DIAGNOSIS — I2699 Other pulmonary embolism without acute cor pulmonale: Secondary | ICD-10-CM | POA: Diagnosis not present

## 2013-10-25 DIAGNOSIS — R000 Tachycardia, unspecified: Secondary | ICD-10-CM | POA: Diagnosis not present

## 2013-10-25 DIAGNOSIS — R569 Unspecified convulsions: Secondary | ICD-10-CM | POA: Diagnosis present

## 2013-10-25 DIAGNOSIS — Z923 Personal history of irradiation: Secondary | ICD-10-CM | POA: Diagnosis not present

## 2013-10-25 DIAGNOSIS — Z8546 Personal history of malignant neoplasm of prostate: Secondary | ICD-10-CM

## 2013-10-25 DIAGNOSIS — R0602 Shortness of breath: Secondary | ICD-10-CM | POA: Diagnosis not present

## 2013-10-25 DIAGNOSIS — D649 Anemia, unspecified: Secondary | ICD-10-CM | POA: Diagnosis present

## 2013-10-25 DIAGNOSIS — Z87891 Personal history of nicotine dependence: Secondary | ICD-10-CM | POA: Diagnosis not present

## 2013-10-25 DIAGNOSIS — I519 Heart disease, unspecified: Secondary | ICD-10-CM | POA: Diagnosis not present

## 2013-10-25 DIAGNOSIS — M542 Cervicalgia: Secondary | ICD-10-CM | POA: Diagnosis present

## 2013-10-25 DIAGNOSIS — G40909 Epilepsy, unspecified, not intractable, without status epilepticus: Secondary | ICD-10-CM | POA: Diagnosis present

## 2013-10-25 DIAGNOSIS — R55 Syncope and collapse: Secondary | ICD-10-CM | POA: Diagnosis not present

## 2013-10-25 DIAGNOSIS — I369 Nonrheumatic tricuspid valve disorder, unspecified: Secondary | ICD-10-CM | POA: Diagnosis not present

## 2013-10-25 DIAGNOSIS — Z86711 Personal history of pulmonary embolism: Secondary | ICD-10-CM

## 2013-10-25 LAB — MRSA PCR SCREENING: MRSA by PCR: NEGATIVE

## 2013-10-25 LAB — CBC
HCT: 43.5 % (ref 39.0–52.0)
HEMATOCRIT: 45.2 % (ref 39.0–52.0)
Hemoglobin: 15.4 g/dL (ref 13.0–17.0)
Hemoglobin: 14.9 g/dL (ref 13.0–17.0)
MCH: 33.2 pg (ref 26.0–34.0)
MCH: 32.5 pg (ref 26.0–34.0)
MCHC: 34.1 g/dL (ref 30.0–36.0)
MCHC: 34.3 g/dL (ref 30.0–36.0)
MCV: 97.4 fL (ref 78.0–100.0)
MCV: 95 fL (ref 78.0–100.0)
PLATELETS: 168 10*3/uL (ref 150–400)
PLATELETS: 155 10*3/uL (ref 150–400)
RBC: 4.64 MIL/uL (ref 4.22–5.81)
RBC: 4.58 MIL/uL (ref 4.22–5.81)
RDW: 13.2 % (ref 11.5–15.5)
RDW: 13.2 % (ref 11.5–15.5)
WBC: 4.5 10*3/uL (ref 4.0–10.5)
WBC: 4.6 10*3/uL (ref 4.0–10.5)

## 2013-10-25 LAB — COMPREHENSIVE METABOLIC PANEL
ALT: 20 U/L (ref 0–53)
AST: 21 U/L (ref 0–37)
Albumin: 3.6 g/dL (ref 3.5–5.2)
Alkaline Phosphatase: 169 U/L — ABNORMAL HIGH (ref 39–117)
Anion gap: 19 — ABNORMAL HIGH (ref 5–15)
BUN: 13 mg/dL (ref 6–23)
CALCIUM: 8.6 mg/dL (ref 8.4–10.5)
CO2: 19 mEq/L (ref 19–32)
Chloride: 100 mEq/L (ref 96–112)
Creatinine, Ser: 0.88 mg/dL (ref 0.50–1.35)
GFR calc Af Amer: >90 mL/min (ref >90)
GFR calc non Af Amer: 81 mL/min — ABNORMAL LOW (ref >90)
Glucose, Bld: 110 mg/dL — ABNORMAL HIGH (ref 70–99)
Potassium: 3.9 mEq/L (ref 3.7–5.3)
SODIUM: 138 meq/L (ref 137–147)
TOTAL PROTEIN: 7.4 g/dL (ref 6.0–8.3)
Total Bilirubin: 0.4 mg/dL (ref 0.3–1.2)

## 2013-10-25 LAB — TSH: TSH: 0.646 u[IU]/mL (ref 0.350–4.500)

## 2013-10-25 LAB — GLUCOSE, CAPILLARY: GLUCOSE-CAPILLARY: 99 mg/dL (ref 70–99)

## 2013-10-25 LAB — D-DIMER, QUANTITATIVE: D-Dimer, Quant: 17.35 ug/mL-FEU — ABNORMAL HIGH (ref 0.00–0.48)

## 2013-10-25 LAB — PHOSPHORUS: PHOSPHORUS: 3.2 mg/dL (ref 2.3–4.6)

## 2013-10-25 LAB — BASIC METABOLIC PANEL
ANION GAP: 13 (ref 5–15)
BUN: 16 mg/dL (ref 6–23)
CHLORIDE: 101 meq/L (ref 96–112)
CO2: 21 meq/L (ref 19–32)
CREATININE: 0.98 mg/dL (ref 0.50–1.35)
Calcium: 9.2 mg/dL (ref 8.4–10.5)
GFR calc non Af Amer: 77 mL/min — ABNORMAL LOW (ref >90)
GFR, EST AFRICAN AMERICAN: 90 mL/min — AB (ref >90)
Glucose, Bld: 148 mg/dL — ABNORMAL HIGH (ref 70–99)
Potassium: 4 mEq/L (ref 3.7–5.3)
Sodium: 135 mEq/L — ABNORMAL LOW (ref 137–147)

## 2013-10-25 LAB — PROTIME-INR
INR: 1.29 (ref 0.00–1.49)
Prothrombin Time: 16.1 seconds — ABNORMAL HIGH (ref 11.6–15.2)

## 2013-10-25 LAB — LACTIC ACID, PLASMA: LACTIC ACID, VENOUS: 1.6 mmol/L (ref 0.5–2.2)

## 2013-10-25 LAB — CORTISOL: Cortisol, Plasma: 10.4 ug/dL

## 2013-10-25 LAB — APTT: aPTT: 137 seconds — ABNORMAL HIGH (ref 24–37)

## 2013-10-25 LAB — I-STAT TROPONIN, ED: Troponin i, poc: 0.02 ng/mL (ref 0.00–0.08)

## 2013-10-25 LAB — PROCALCITONIN: Procalcitonin: <0.1 ng/mL

## 2013-10-25 LAB — PHENYTOIN LEVEL, TOTAL: Phenytoin Lvl: 9.7 ug/mL — ABNORMAL LOW (ref 10.0–20.0)

## 2013-10-25 LAB — MAGNESIUM: Magnesium: 1.7 mg/dL (ref 1.5–2.5)

## 2013-10-25 LAB — PRO B NATRIURETIC PEPTIDE: Pro B Natriuretic peptide (BNP): 2305 pg/mL — ABNORMAL HIGH (ref 0–450)

## 2013-10-25 MED ORDER — PHENYTOIN SODIUM EXTENDED 100 MG PO CAPS
300.0000 mg | ORAL_CAPSULE | Freq: Every day | ORAL | Status: DC
  Administered 2013-10-25 – 2013-10-31 (×7): 300 mg via ORAL
  Filled 2013-10-25 (×9): qty 3

## 2013-10-25 MED ORDER — FENTANYL CITRATE 0.05 MG/ML IJ SOLN
INTRAMUSCULAR | Status: AC | PRN
  Administered 2013-10-25: 50 ug via INTRAVENOUS

## 2013-10-25 MED ORDER — HEPARIN (PORCINE) IN NACL 100-0.45 UNIT/ML-% IJ SOLN
1350.0000 [IU]/h | INTRAMUSCULAR | Status: DC
  Administered 2013-10-25: 1400 [IU]/h via INTRAVENOUS
  Administered 2013-10-26: 1450 [IU]/h via INTRAVENOUS
  Administered 2013-10-26 (×2): 1200 [IU]/h via INTRAVENOUS
  Administered 2013-10-27: 1450 [IU]/h via INTRAVENOUS
  Administered 2013-10-28: 1650 [IU]/h via INTRAVENOUS
  Administered 2013-10-28 – 2013-10-29 (×2): 1250 [IU]/h via INTRAVENOUS
  Administered 2013-10-30 (×2): 1500 [IU]/h via INTRAVENOUS
  Administered 2013-10-31 – 2013-11-01 (×2): 1350 [IU]/h via INTRAVENOUS
  Filled 2013-10-25 (×19): qty 250

## 2013-10-25 MED ORDER — SODIUM CHLORIDE 0.9 % IV SOLN
12.0000 mg | Freq: Once | INTRAVENOUS | Status: AC
  Filled 2013-10-25: qty 12

## 2013-10-25 MED ORDER — IOHEXOL 350 MG/ML SOLN
80.0000 mL | Freq: Once | INTRAVENOUS | Status: AC | PRN
  Administered 2013-10-25: 80 mL via INTRAVENOUS

## 2013-10-25 MED ORDER — SODIUM CHLORIDE 0.9 % IV SOLN
INTRAVENOUS | Status: AC | PRN
  Administered 2013-10-25: 30 mL/h via INTRAVENOUS

## 2013-10-25 MED ORDER — ALTEPLASE 50 MG IV SOLR
1.0000 mg/h | INTRAVENOUS | Status: DC
  Administered 2013-10-25: 1 mg/h via INTRAVENOUS
  Filled 2013-10-25: qty 12

## 2013-10-25 MED ORDER — SODIUM CHLORIDE 0.9 % IV SOLN
INTRAVENOUS | Status: DC
  Administered 2013-10-26: 10 mL/h via INTRAVENOUS

## 2013-10-25 MED ORDER — MIDAZOLAM HCL 2 MG/2ML IJ SOLN
INTRAMUSCULAR | Status: AC | PRN
  Administered 2013-10-25: 1 mg via INTRAVENOUS

## 2013-10-25 MED ORDER — SODIUM CHLORIDE 0.9 % IV SOLN
INTRAVENOUS | Status: DC
  Administered 2013-10-25: 22:00:00 via INTRAVENOUS

## 2013-10-25 MED ORDER — PANTOPRAZOLE SODIUM 40 MG IV SOLR
40.0000 mg | Freq: Every day | INTRAVENOUS | Status: DC
  Administered 2013-10-25: 40 mg via INTRAVENOUS
  Filled 2013-10-25 (×3): qty 40

## 2013-10-25 MED ORDER — SODIUM CHLORIDE 0.9 % IJ SOLN
3.0000 mL | Freq: Two times a day (BID) | INTRAMUSCULAR | Status: DC
  Administered 2013-10-26 – 2013-10-28 (×3): 3 mL via INTRAVENOUS

## 2013-10-25 MED ORDER — SODIUM CHLORIDE 0.9 % IV BOLUS (SEPSIS): 500.0000 mL | Freq: Once | INTRAVENOUS | Status: DC

## 2013-10-25 MED ORDER — MIDAZOLAM HCL 2 MG/2ML IJ SOLN
INTRAMUSCULAR | Status: AC
  Filled 2013-10-25: qty 2

## 2013-10-25 MED ORDER — HEPARIN BOLUS VIA INFUSION
5000.0000 [IU] | Freq: Once | INTRAVENOUS | Status: AC
  Administered 2013-10-25: 5000 [IU] via INTRAVENOUS
  Filled 2013-10-25: qty 5000

## 2013-10-25 MED ORDER — ALTEPLASE 50 MG IV SOLR: 1.0000 mg/h | INTRAVENOUS | Status: DC

## 2013-10-25 MED ORDER — IOHEXOL 300 MG/ML  SOLN
100.0000 mL | Freq: Once | INTRAMUSCULAR | Status: AC | PRN
  Administered 2013-10-25: 20 mL via INTRAVENOUS

## 2013-10-25 MED ORDER — SODIUM CHLORIDE 0.9 % IV SOLN
INTRAVENOUS | Status: DC
  Administered 2013-10-31: 13:00:00 via INTRAVENOUS

## 2013-10-25 MED ORDER — PHENYTOIN SODIUM EXTENDED 100 MG PO CAPS
100.0000 mg | ORAL_CAPSULE | Freq: Every day | ORAL | Status: DC
  Administered 2013-10-26 – 2013-11-01 (×7): 100 mg via ORAL
  Filled 2013-10-25 (×7): qty 1

## 2013-10-25 MED ORDER — SODIUM CHLORIDE 0.9 % IJ SOLN: 3.0000 mL | INTRAMUSCULAR | Status: DC | PRN

## 2013-10-25 MED ORDER — PHENYTOIN SODIUM EXTENDED 100 MG PO CAPS
100.0000 mg | ORAL_CAPSULE | Freq: Two times a day (BID) | ORAL | Status: DC
  Filled 2013-10-25: qty 3

## 2013-10-25 MED ORDER — FENTANYL CITRATE 0.05 MG/ML IJ SOLN
INTRAMUSCULAR | Status: AC
  Filled 2013-10-25: qty 2

## 2013-10-25 MED ORDER — SODIUM CHLORIDE 0.9 % IV SOLN: Freq: Once | INTRAVENOUS | Status: DC

## 2013-10-25 MED ORDER — SODIUM CHLORIDE 0.9 % IV SOLN: 250.0000 mL | INTRAVENOUS | Status: DC | PRN

## 2013-10-25 MED ORDER — ZOLPIDEM TARTRATE 5 MG PO TABS
5.0000 mg | ORAL_TABLET | Freq: Every evening | ORAL | Status: DC | PRN
  Administered 2013-10-25 – 2013-10-26 (×2): 5 mg via ORAL
  Filled 2013-10-25 (×2): qty 1

## 2013-10-25 MED ORDER — SODIUM CHLORIDE 0.9 % IV SOLN: INTRAVENOUS | Status: DC

## 2013-10-25 NOTE — H&P (Signed)
PULMONARY / CRITICAL CARE MEDICINE   Name: Dandrea Widdowson MRN: [id number] DOB: 1935/02/12    ADMISSION DATE:  10/25/2013   REFERRING MD :  EDP  CHIEF COMPLAINT:  SOB  INITIAL PRESENTATION:  [name] who represents with sob after being seen 7/24 in ED with syncope. He had CT angio chest with large saddle embolus with CT evidence of RV dysfunction (ratio 2) and PCCM asked to admit.  STUDIES:  CT chest 8/1 large saddle pe  SIGNIFICANT EVENTS: 8/1 large PE   HISTORY OF PRESENT ILLNESS:   [name] who developed a headache on 7/19 and used Goody powders and bed rest to treat headache. On 7/24 he passed out at home(I woke up on floor) and was taken to ED and CT head was negative and he was discharged to home. He returns 8/1 with increased SOB and CT angio was + for large saddle PE and he will be admitted to ICU and placed on heparin drip. IR consulted for possible catheter directed TPA.  PAST MEDICAL HISTORY :  Past Medical History  Diagnosis Date  . Type II or unspecified type diabetes mellitus without mention of complication, not stated as uncontrolled   . Impotence of organic origin   . Cervicalgia   . Pure hypercholesterolemia   . Essential hypertension, benign   . Cancer     Prostate   History reviewed. No pertinent past surgical history. Prior to Admission medications   Medication Sig Start Date End Date Taking? Authorizing Provider  acetaminophen (TYLENOL) 325 MG tablet Take 650 mg by mouth every 4 (four) hours as needed for pain.   Yes Historical Provider, MD  lisinopril (PRINIVIL,ZESTRIL) 30 MG tablet Take 30 mg by mouth daily.   Yes Historical Provider, MD  phenytoin (DILANTIN) 100 MG ER capsule Take 100-300 mg by mouth 2 (two) times daily. 100mg  in the morning and 300mg  at bedtime   Yes Historical Provider, MD  pravastatin (PRAVACHOL) 40 MG tablet Take 40 mg by mouth at bedtime.   Yes Historical Provider, MD   No Known Allergies  FAMILY HISTORY:  Family History  Problem  Relation Age of Onset  . Cancer Mother   . Cancer Father    SOCIAL HISTORY:  reports that he has quit smoking. He has never used smokeless tobacco. His alcohol and drug histories are not on file.  REVIEW OF SYSTEMS:  10 point review of system taken, please see HPI for positives and negatives.   SUBJECTIVE:   VITAL SIGNS: Temp:  [98.5 F (36.9 C)] 98.5 F (36.9 C) (08/01 1138) Pulse Rate:  [65-121] 107 (08/01 1600) Resp:  [15-27] 15 (08/01 1600) BP: (135-163)/(82-108) 163/108 mmHg (08/01 1600) SpO2:  [93 %-100 %] 98 % (08/01 1600) Weight:  [195 lb (88.451 kg)] 195 lb (88.451 kg) (08/01 1138) HEMODYNAMICS:   VENTILATOR SETTINGS:   INTAKE / OUTPUT: No intake or output data in the 24 hours ending 10/25/13 1617  PHYSICAL EXAMINATION: General:  wmwd aam nad AT REST Neuro:  iNTACT HEENT:  Poor dentation, No JVD/LAN Cardiovascular:  HSR RRR Lungs: CTA Abdomen:  Soft ni. +BS Musculoskeletal:  Intact, left proximal head of bicep ruptured x 1 year, Large lipoma left shoulder Skin:  Warm and dry  LABS:  CBC  Recent Labs Lab 10/25/13 1142  WBC 4.5  HGB 15.4  HCT 45.2  PLT 168   Coag's No results found for this basename: APTT, INR,  in the last 168 hours BMET  Recent Labs  Lab 10/25/13 1142  NA 135*  K 4.0  CL 101  CO2 21  BUN 16  CREATININE 0.98  GLUCOSE 148*   Electrolytes  Recent Labs Lab 10/25/13 1142  CALCIUM 9.2   Sepsis Markers No results found for this basename: LATICACIDVEN, PROCALCITON, O2SATVEN,  in the last 168 hours ABG No results found for this basename: PHART, PCO2ART, PO2ART,  in the last 168 hours Liver Enzymes No results found for this basename: AST, ALT, ALKPHOS, BILITOT, ALBUMIN,  in the last 168 hours Cardiac Enzymes  Recent Labs Lab 10/25/13 1234  PROBNP 2305.0*   Glucose No results found for this basename: GLUCAP,  in the last 168 hours  Imaging No results found.   ASSESSMENT / PLAN:  PULMONARY  A Large saddle PE  with RV dysfunction on CT Hx of prostate cancer Co leg pain P:   IR evaluation for TPA Heparin drip O2 as needed LEDS to ro dvt as source of pe Admit to icu  CARDIOVASCULAR  A:  HTN P:  Hold antihypertensive Bedside echo ( I called echo tech)  RENAL A:  No Acute Issue P:     GASTROINTESTINAL A:   GI protection while on heparin P:   PPI  HEMATOLOGIC A:   Hx of prostate ca 5 years ago , followed by Dr. Risa Grill P:  Check PSA for completeness   INFECTIOUS A:  No acute process P:    ENDOCRINE A:   DM   P:   SSI  NEUROLOGIC A:   Recent syncope 7/24 with neg ct head Sz DO P:   RASS goal: 1 Monitor neuro status Continue  sz meds  TODAY'S SUMMARY:  [name] who represents with sob after being seen 7/24 in ED with syncope. He had CT angio chest with large saddle embolus and PCCM asked to admit.  I have personally obtained a history, examined the patient, evaluated laboratory and imaging results, formulated the assessment and plan and placed orders. CRITICAL CARE: The patient is critically ill with multiple organ systems failure and requires high complexity decision making for assessment and support, frequent evaluation and titration of therapies, application of advanced monitoring technologies and extensive interpretation of multiple databases. Critical Care Time devoted to patient care services described in this note is 50 minutes.   Rigoberto Noel  MD Pulmonary and [place] Pager: [phone]  10/25/2013, 4:17 PM

## 2013-10-25 NOTE — H&P (Signed)
[name] is an 79 y.o. male.   Chief Complaint: Pulm Embolism HPI: Active 79YOM has had a two week history of weakness and dynsnea. He has had episodes of syncope and has been worked up with a head CT which was negative. Presently, he can only walk 15 feet before becoming dyspneic. CT demonstrate B PE with R heart strain (RV/LV 2.0). EKG negative. No history of stroke, ulcer, GI bleed, brain tumor.  Past Medical History  Diagnosis Date  . Type II or unspecified type diabetes mellitus without mention of complication, not stated as uncontrolled   . Impotence of organic origin   . Cervicalgia   . Pure hypercholesterolemia   . Essential hypertension, benign   . Cancer     Prostate    History reviewed. No pertinent past surgical history.  Family History  Problem Relation Age of Onset  . Cancer Mother   . Cancer Father    Social History:  reports that he has quit smoking. He has never used smokeless tobacco. His alcohol and drug histories are not on file.  Allergies: No Known Allergies   (Not in a hospital admission)  Results for orders placed during the hospital encounter of 10/25/13 (from the past 48 hour(s))  CBC     Status: None   Collection Time    10/25/13 11:42 AM      Result Value Ref Range   WBC 4.5  4.0 - 10.5 K/uL   RBC 4.64  4.22 - 5.81 MIL/uL   Hemoglobin 15.4  13.0 - 17.0 g/dL   HCT 45.2  39.0 - 52.0 %   MCV 97.4  78.0 - 100.0 fL   MCH 33.2  26.0 - 34.0 pg   MCHC 34.1  30.0 - 36.0 g/dL   RDW 13.2  11.5 - 15.5 %   Platelets 168  150 - 400 K/uL  BASIC METABOLIC PANEL     Status: Abnormal   Collection Time    10/25/13 11:42 AM      Result Value Ref Range   Sodium 135 (*) 137 - 147 mEq/L   Potassium 4.0  3.7 - 5.3 mEq/L   Chloride 101  96 - 112 mEq/L   CO2 21  19 - 32 mEq/L   Glucose, Bld 148 (*) 70 - 99 mg/dL   BUN 16  6 - 23 mg/dL   Creatinine, Ser 0.98  0.50 - 1.35 mg/dL   Calcium 9.2  8.4 - 10.5 mg/dL   GFR calc non Af Amer 77 (*) >90 mL/min   GFR calc  Af Amer 90 (*) >90 mL/min   Comment: (NOTE)     The eGFR has been calculated using the CKD EPI equation.     This calculation has not been validated in all clinical situations.     eGFR's persistently <90 mL/min signify possible Chronic Kidney     Disease.   Anion gap 13  5 - 15  I-STAT TROPOININ, ED     Status: None   Collection Time    10/25/13 11:58 AM      Result Value Ref Range   Troponin i, poc 0.02  0.00 - 0.08 ng/mL   Comment 3            Comment: Due to the release kinetics of cTnI,     a negative result within the first hours     of the onset of symptoms does not rule out     myocardial infarction with certainty.  If myocardial infarction is still suspected,     repeat the test at appropriate intervals.  TSH     Status: None   Collection Time    10/25/13 12:34 PM      Result Value Ref Range   TSH 0.646  0.350 - 4.500 uIU/mL  PRO B NATRIURETIC PEPTIDE     Status: Abnormal   Collection Time    10/25/13 12:34 PM      Result Value Ref Range   Pro B Natriuretic peptide (BNP) 2305.0 (*) 0 - 450 pg/mL  D-DIMER, QUANTITATIVE     Status: Abnormal   Collection Time    10/25/13 12:34 PM      Result Value Ref Range   D-Dimer, Quant 17.35 (*) 0.00 - 0.48 ug/mL-FEU   Comment:            AT THE INHOUSE ESTABLISHED CUTOFF     VALUE OF 0.48 ug/mL FEU,     THIS ASSAY HAS BEEN DOCUMENTED     IN THE LITERATURE TO HAVE     A SENSITIVITY AND NEGATIVE     PREDICTIVE VALUE OF AT LEAST     98 TO 99%.  THE TEST RESULT     SHOULD BE CORRELATED WITH     AN ASSESSMENT OF THE CLINICAL     PROBABILITY OF DVT / VTE.  PHENYTOIN LEVEL, TOTAL     Status: Abnormal   Collection Time    10/25/13 12:34 PM      Result Value Ref Range   Phenytoin Lvl 9.7 (*) 10.0 - 20.0 ug/mL  CBC     Status: None   Collection Time    10/25/13  4:55 PM      Result Value Ref Range   WBC 4.6  4.0 - 10.5 K/uL   RBC 4.58  4.22 - 5.81 MIL/uL   Hemoglobin 14.9  13.0 - 17.0 g/dL   HCT 43.5  39.0 - 52.0 %   MCV  95.0  78.0 - 100.0 fL   MCH 32.5  26.0 - 34.0 pg   MCHC 34.3  30.0 - 36.0 g/dL   RDW 13.2  11.5 - 15.5 %   Platelets 155  150 - 400 K/uL   Dg Chest 2 View  10/25/2013   CLINICAL DATA:  Syncope, short of breath  EXAM: CHEST  2 VIEW  COMPARISON:  Prior chest x-ray 01/17/2011; prior chest CT 01/18/2011  FINDINGS: Stable borderline cardiomegaly. Atherosclerotic and tortuous thoracic aorta. Mild central bronchitic changes and chronic increased lung markings in the right perihilar region dating back to October of 2012. No focal airspace consolidation, pleural effusion or pneumothorax. No suspicious pulmonary nodule. No acute osseous abnormality. Degenerative spurring throughout the thoracic spine.  IMPRESSION: Stable chest x-ray without evidence acute cardiopulmonary process.   Electronically Signed   By: Jacqulynn Cadet M.D.   On: 10/25/2013 13:51   Ct Angio Chest Pe W/cm &/or Wo Cm  10/25/2013   CLINICAL DATA:  Syncope  EXAM: CT ANGIOGRAPHY CHEST WITH CONTRAST  TECHNIQUE: Multidetector CT imaging of the chest was performed using the standard protocol during bolus administration of intravenous contrast. Multiplanar CT image reconstructions and MIPs were obtained to evaluate the vascular anatomy.  CONTRAST:  11m OMNIPAQUE IOHEXOL 350 MG/ML SOLN  COMPARISON:  None.  FINDINGS: There is extensive bilateral pulmonary thromboembolism as well as saddle embolus extending across the bifurcation. Thrombus extends into the main right pulmonary artery and lobar branches. There is thrombus in the main left  pulmonary artery extending into the left upper and lower lobe lobar branches. Right ventricle to left ventricle sub valvular diameter ratio is 2.0. The right atrium is dilated. There is reflux of contrast into the hepatic veins and IVC. These findings are compatible with right heart dysfunction.  Small mediastinal nodes.  No obvious aortic dissection.  No pneumothorax.  No pleural effusion.  Dependent atelectasis at the  lung bases.  No acute bony deformity.  Review of the MIP images confirms the above findings.  IMPRESSION: Study is positive for pulmonary thromboembolism and right heart strain. Critical Value/emergent results were called by telephone at the time of interpretation on 10/25/2013 at 3:42 pm to Dr. Tanna Furry , who verbally acknowledged these results.   Electronically Signed   By: Maryclare Bean M.D.   On: 10/25/2013 15:42    ROS  Blood pressure 167/96, pulse 141, temperature 98.5 F (36.9 C), temperature source Oral, resp. rate 23, height 6' (1.829 m), weight 195 lb (88.451 kg), SpO2 88.00%. Physical Exam  Constitutional: He is oriented to person, place, and time. He appears well-developed and well-nourished.  HENT:  Head: Normocephalic and atraumatic.  Neck: Normal range of motion. Neck supple.  Cardiovascular: Regular rhythm.   HR 107  Respiratory: Effort normal and breath sounds normal.  GI: Soft. Bowel sounds are normal.  Musculoskeletal: He exhibits no edema and no tenderness.  Neurological: He is alert and oriented to person, place, and time.  Skin: Skin is warm and dry.     Assessment/Plan PE EKOS lysis is offered for relief of symptoms and prevention of delayed venous insufficiency in the PA branches. He understands and wishes to proceed.  Monroe Toure,ART A 10/25/2013, 5:19 PM

## 2013-10-25 NOTE — Sedation Documentation (Addendum)
TPA @ 1mg ,  NS to sheath @ 21ml/hr and NS to coolant begun @ 10ml/hr unable to chart in computer yet.  Will contact pharmacy. EKOS infusing

## 2013-10-25 NOTE — Progress Notes (Signed)
  Echocardiogram 2D Echocardiogram has been performed.  Mauricio Po 10/25/2013, 4:49 PM

## 2013-10-25 NOTE — Progress Notes (Signed)
ANTICOAGULATION CONSULT NOTE - Initial Consult  Pharmacy Consult for heparin Indication: pulmonary embolus  No Known Allergies  Patient Measurements: Height: 6' (182.9 cm) Weight: 195 lb (88.451 kg) IBW/kg (Calculated) : 77.6 Heparin Dosing Weight: 88kg  Vital Signs: Temp: 98.5 F (36.9 C) (08/01 1138) Temp src: Oral (08/01 1138) BP: 137/92 mmHg (08/01 1500) Pulse Rate: 102 (08/01 1500)  Labs:  Recent Labs  10/25/13 1142  HGB 15.4  HCT 45.2  PLT 168  CREATININE 0.98    Estimated Creatinine Clearance: 69.3 ml/min (by C-G formula based on Cr of 0.98).   Medical History: Past Medical History  Diagnosis Date  . Type II or unspecified type diabetes mellitus without mention of complication, not stated as uncontrolled   . Impotence of organic origin   . Cervicalgia   . Pure hypercholesterolemia   . Essential hypertension, benign   . Cancer     Prostate    Medications:  See EMR  Assessment: 79 year old male presents to [facility] with shortness of breath and near syncope. D-dimer positive and CTA positive for bilateraly pulmonary emboli and dilated right heart consistent with right heart strain. New orders to start IV heparin. No history of anticoagulants. CBC appears normal, will check INR with next lab draw.  Goal of Therapy:  Heparin level 0.3-0.7 units/ml Monitor platelets by anticoagulation protocol: Yes   Plan:  Give 5000 units bolus x 1 Start heparin infusion at 1400 units/hr Check anti-Xa level in 8 hours and daily while on heparin Continue to monitor H&H and platelets  Erin Hearing PharmD., BCPS Clinical Pharmacist Pager [phone] 10/25/2013 3:57 PM

## 2013-10-25 NOTE — Sedation Documentation (Signed)
Main PA 64/21 mean 34

## 2013-10-25 NOTE — Sedation Documentation (Signed)
O2 d/c'd 

## 2013-10-25 NOTE — Procedures (Signed)
B PE lysis Find - Saddle embolus PA pressure (MAP) - 34 mm HG B 12 cm EKOS catheter. No comp

## 2013-10-25 NOTE — ED Provider Notes (Addendum)
CSN: [id number]     Arrival date & time 10/25/13  1133 History   First MD Initiated Contact with Patient 10/25/13 1143     Chief Complaint  Patient presents with  . Near Syncope  . Weakness      HPI  Patient presents with weakness and difficulty with exertion. Seen and evaluated here 8 days ago with a syncopal episode. 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  In ED he was hydrated, and felt better. Not hypoxemic. No chest pain.He was discharged after his studies were reassuring and his heart rate improved. Pt states that he felt well that day, and into the following day.  He states since about 24-48 hours after that visit, he's had no exertional capacity. He states to walk even across a room at home he feels short of breath. He has no chest pain.    Since that time he states he really cannot exert more than several steps without feeling short of breath and lightheaded. No additional ectopy. No recent prolonged immobilization, cast, splint, fractures, surgeries, procedures. History of prostate cancer treated with radiation 5 years ago thought to be in remission. No family history of DVT or PE. He states the back of both legs are sore, but he has not noted any swelling. No history of heart disease. States he has diabetes but it's "not treated". High cholesterol hypertension medicated. Seizure disorder, on Dilantin. Denies seizure activity today or the last several weeks.  Past Medical History  Diagnosis Date  . Type II or unspecified type diabetes mellitus without mention of complication, not stated as uncontrolled   . Impotence of organic origin   . Cervicalgia   . Pure hypercholesterolemia   . Essential hypertension, benign   . Cancer     Prostate   History reviewed. No pertinent past surgical history. Family History  Problem Relation Age of Onset  . Cancer Mother   . Cancer Father    History  Substance Use Topics  . Smoking status: Former Research scientist (life sciences)   . Smokeless tobacco: Never Used  . Alcohol Use: Not on file    Review of Systems  Constitutional: Positive for fatigue. Negative for fever, chills, diaphoresis and appetite change.  HENT: Negative for mouth sores, sore throat and trouble swallowing.   Eyes: Negative for visual disturbance.  Respiratory: Positive for shortness of breath. Negative for cough, chest tightness and wheezing.   Cardiovascular: Negative for chest pain.  Gastrointestinal: Negative for nausea, vomiting, abdominal pain, diarrhea and abdominal distention.  Endocrine: Negative for polydipsia, polyphagia and polyuria.  Genitourinary: Negative for dysuria, frequency and hematuria.  Musculoskeletal: Negative for gait problem.  Skin: Negative for color change, pallor and rash.  Neurological: Positive for syncope. Negative for dizziness, light-headedness and headaches.  Hematological: Does not bruise/bleed easily.  Psychiatric/Behavioral: Negative for behavioral problems and confusion.      Allergies  Review of patient's allergies indicates no known allergies.  Home Medications   Prior to Admission medications   Medication Sig Start Date End Date Taking? Authorizing Provider  acetaminophen (TYLENOL) 325 MG tablet Take 650 mg by mouth every 4 (four) hours as needed for pain.   Yes Historical Provider, MD  lisinopril (PRINIVIL,ZESTRIL) 30 MG tablet Take 30 mg by mouth daily.   Yes Historical Provider, MD  phenytoin (DILANTIN) 100 MG ER capsule Take 100-300 mg by mouth 2 (two) times daily. 100mg  in the morning and 300mg  at bedtime   Yes  Historical Provider, MD  pravastatin (PRAVACHOL) 40 MG tablet Take 40 mg by mouth at bedtime.   Yes Historical Provider, MD   BP 137/92  Pulse 102  Temp(Src) 98.5 F (36.9 C) (Oral)  Resp 25  Ht 6' (1.829 m)  Wt 195 lb (88.451 kg)  BMI 26.44 kg/m2  SpO2 96% Physical Exam  Constitutional: He is oriented to person, place, and time. He appears well-developed and well-nourished.  No distress.  HENT:  Head: Normocephalic.  Eyes: Conjunctivae are normal. Pupils are equal, round, and reactive to light. No scleral icterus.  Neck: Normal range of motion. Neck supple. No thyromegaly present.  Cardiovascular: Normal rate and regular rhythm.  Exam reveals no gallop and no friction rub.   No murmur heard. Pulmonary/Chest: Effort normal and breath sounds normal. No respiratory distress. He has no wheezes. He has no rales.  Her lungs. He has a resting tachycardia. No aortic stenosis murmur.  Abdominal: Soft. Bowel sounds are normal. He exhibits no distension. There is no tenderness. There is no rebound.  Musculoskeletal: Normal range of motion.  Neurological: He is alert and oriented to person, place, and time.  Skin: Skin is warm and dry. No rash noted.  Psychiatric: He has a normal mood and affect. His behavior is normal.    ED Course  CRITICAL CARE Performed by: Isaac Lacson, [place] by: Tanna Furry Total critical care time: 30 minutes Critical care start time: 10/25/2013 5:54 PM Critical care end time: 10/25/2013 4:24 PM Critical care time was exclusive of separately billable procedures and treating other patients. Critical care was necessary to treat or prevent imminent or life-threatening deterioration of the following conditions: Pulmonary emboli. Critical care was time spent personally by me on the following activities: blood draw for specimens, development of treatment plan with patient or surrogate, discussions with consultants, interpretation of cardiac output measurements, examination of patient, obtaining history from patient or surrogate, ordering and performing treatments and interventions, ordering and review of laboratory studies, ordering and review of radiographic studies, pulse oximetry and re-evaluation of patient's condition (Heparin bolus and infusion).   (including critical care time) Labs Review Labs Reviewed  BASIC METABOLIC PANEL - Abnormal; Notable  for the following:    Sodium 135 (*)    Glucose, Bld 148 (*)    GFR calc non Af Amer 77 (*)    GFR calc Af Amer 90 (*)    All other components within normal limits  PRO B NATRIURETIC PEPTIDE - Abnormal; Notable for the following:    Pro B Natriuretic peptide (BNP) 2305.0 (*)    All other components within normal limits  D-DIMER, QUANTITATIVE - Abnormal; Notable for the following:    D-Dimer, Quant 17.35 (*)    All other components within normal limits  PHENYTOIN LEVEL, TOTAL - Abnormal; Notable for the following:    Phenytoin Lvl 9.7 (*)    All other components within normal limits  CBC  TSH  I-STAT TROPOININ, ED    Imaging Review Dg Chest 2 View  10/25/2013   CLINICAL DATA:  Syncope, short of breath  EXAM: CHEST  2 VIEW  COMPARISON:  Prior chest x-ray 01/17/2011; prior chest CT 01/18/2011  FINDINGS: Stable borderline cardiomegaly. Atherosclerotic and tortuous thoracic aorta. Mild central bronchitic changes and chronic increased lung markings in the right perihilar region dating back to October of 2012. No focal airspace consolidation, pleural effusion or pneumothorax. No suspicious pulmonary nodule. No acute osseous abnormality. Degenerative spurring throughout the thoracic spine.  IMPRESSION: Stable  chest x-ray without evidence acute cardiopulmonary process.   Electronically Signed   By: Jacqulynn Cadet M.D.   On: 10/25/2013 13:51     EKG Interpretation   Date/Time:  Saturday October 25 2013 11:38:18 EDT Ventricular Rate:  114 PR Interval:  194 QRS Duration: 88 QT Interval:  320 QTC Calculation: 441 R Axis:   124 Text Interpretation:  Sinus tachycardia Right axis deviation Anteroseptal  infarct , age undetermined Abnormal ECG No significant change since last  tracing Confirmed by Winfred Leeds  MD, SAM [phone]) on 10/25/2013 11:45:15 AM  Also confirmed by Jeneen Rinks  MD, [place] ([id number])  on 10/25/2013 12:11:52 PM      MDM   Final diagnoses:  Pulmonary emboli    D-dimer positive.  Tachycardia on his EKG.  CTA positive for multiple bilateral pulmonary emboli and dilated right heart consistent with right heart strain. Patient is not hypoxemic. He is tachycardic at rest at 110. Not hypotensive. Care discussed with critical care. He'll see the patient. Patient will be given heparin bolus and started on infusion.  16:30:  Pt seen py PCCM. I received a call back from IR and PCCM re: Plans for Lytic therapy Interventional Radiology.   Tanna Furry, MD 10/25/13 [place], MD 10/25/13 [place], MD 10/25/13 [phone]

## 2013-10-25 NOTE — ED Notes (Signed)
Recent near syncope. Ambulatory. Denies CP. NO SOB/focal neuro Sx

## 2013-10-26 ENCOUNTER — Encounter (HOSPITAL_COMMUNITY): Payer: Self-pay | Admitting: General Practice

## 2013-10-26 ENCOUNTER — Inpatient Hospital Stay (HOSPITAL_COMMUNITY): Payer: Medicare Other

## 2013-10-26 DIAGNOSIS — I2699 Other pulmonary embolism without acute cor pulmonale: Secondary | ICD-10-CM

## 2013-10-26 LAB — FIBRINOGEN
FIBRINOGEN: 285 mg/dL (ref 204–475)
Fibrinogen: 233 mg/dL (ref 204–475)
Fibrinogen: 244 mg/dL (ref 204–475)
Fibrinogen: 229 mg/dL (ref 204–475)

## 2013-10-26 LAB — BASIC METABOLIC PANEL
Anion gap: 16 — ABNORMAL HIGH (ref 5–15)
BUN: 12 mg/dL (ref 6–23)
CHLORIDE: 105 meq/L (ref 96–112)
CO2: 18 meq/L — AB (ref 19–32)
CREATININE: 0.83 mg/dL (ref 0.50–1.35)
Calcium: 8.3 mg/dL — ABNORMAL LOW (ref 8.4–10.5)
GFR calc Af Amer: >90 mL/min (ref >90)
GFR calc non Af Amer: 83 mL/min — ABNORMAL LOW (ref >90)
GLUCOSE: 136 mg/dL — AB (ref 70–99)
Potassium: 3.8 mEq/L (ref 3.7–5.3)
Sodium: 139 mEq/L (ref 137–147)

## 2013-10-26 LAB — CBC
HCT: 35 % — ABNORMAL LOW (ref 39.0–52.0)
HEMATOCRIT: 38.9 % — AB (ref 39.0–52.0)
HEMATOCRIT: 34.8 % — AB (ref 39.0–52.0)
HEMATOCRIT: 33.4 % — AB (ref 39.0–52.0)
HEMOGLOBIN: 11.2 g/dL — AB (ref 13.0–17.0)
Hemoglobin: 13.2 g/dL (ref 13.0–17.0)
Hemoglobin: 11.8 g/dL — ABNORMAL LOW (ref 13.0–17.0)
Hemoglobin: 11.6 g/dL — ABNORMAL LOW (ref 13.0–17.0)
MCH: 32.6 pg (ref 26.0–34.0)
MCH: 32 pg (ref 26.0–34.0)
MCH: 32.1 pg (ref 26.0–34.0)
MCH: 31.6 pg (ref 26.0–34.0)
MCHC: 33.9 g/dL (ref 30.0–36.0)
MCHC: 33.9 g/dL (ref 30.0–36.0)
MCHC: 33.5 g/dL (ref 30.0–36.0)
MCHC: 33.1 g/dL (ref 30.0–36.0)
MCV: 96 fL (ref 78.0–100.0)
MCV: 94.3 fL (ref 78.0–100.0)
MCV: 95.7 fL (ref 78.0–100.0)
MCV: 95.4 fL (ref 78.0–100.0)
PLATELETS: 120 10*3/uL — AB (ref 150–400)
PLATELETS: 121 10*3/uL — AB (ref 150–400)
Platelets: 119 10*3/uL — ABNORMAL LOW (ref 150–400)
Platelets: 135 10*3/uL — ABNORMAL LOW (ref 150–400)
RBC: 4.05 MIL/uL — ABNORMAL LOW (ref 4.22–5.81)
RBC: 3.69 MIL/uL — ABNORMAL LOW (ref 4.22–5.81)
RBC: 3.49 MIL/uL — ABNORMAL LOW (ref 4.22–5.81)
RBC: 3.67 MIL/uL — AB (ref 4.22–5.81)
RDW: 13.2 % (ref 11.5–15.5)
RDW: 13.1 % (ref 11.5–15.5)
RDW: 13.3 % (ref 11.5–15.5)
RDW: 13.2 % (ref 11.5–15.5)
WBC: 4.7 10*3/uL (ref 4.0–10.5)
WBC: 5.4 10*3/uL (ref 4.0–10.5)
WBC: 5.3 10*3/uL (ref 4.0–10.5)
WBC: 5.8 10*3/uL (ref 4.0–10.5)

## 2013-10-26 LAB — HEPARIN LEVEL (UNFRACTIONATED)
HEPARIN UNFRACTIONATED: 0.15 [IU]/mL — AB (ref 0.30–0.70)
Heparin Unfractionated: 0.76 IU/mL — ABNORMAL HIGH (ref 0.30–0.70)
Heparin Unfractionated: 0.56 IU/mL (ref 0.30–0.70)

## 2013-10-26 LAB — PROTIME-INR
INR: 1.26 (ref 0.00–1.49)
PROTHROMBIN TIME: 15.8 s — AB (ref 11.6–15.2)

## 2013-10-26 MED ORDER — CETYLPYRIDINIUM CHLORIDE 0.05 % MT LIQD
7.0000 mL | Freq: Two times a day (BID) | OROMUCOSAL | Status: DC
  Administered 2013-10-26 – 2013-11-01 (×8): 7 mL via OROMUCOSAL

## 2013-10-26 NOTE — Progress Notes (Signed)
[place] for heparin Indication: pulmonary embolus  No Known Allergies  Patient Measurements: Height: 6' (182.9 cm) Weight: 195 lb (88.45 kg) IBW/kg (Calculated) : 77.6 Heparin Dosing Weight: 88kg  Vital Signs: Temp: 99.5 F (37.5 C) (08/02 0800) Temp src: Oral (08/02 0800) BP: 142/88 mmHg (08/02 0900) Pulse Rate: 97 (08/02 0900)  Labs:  Recent Labs  10/25/13 1142 10/25/13 1655 10/26/13 0100 10/26/13 0802  HGB 15.4 14.9 13.2 11.8*  HCT 45.2 43.5 38.9* 34.8*  PLT 168 155 120* 121*  APTT  --  137*  --   --   LABPROT  --  16.1* 15.8*  --   INR  --  1.29 1.26  --   HEPARINUNFRC  --   --  0.76* 0.56  CREATININE 0.98 0.88 0.83  --     Estimated Creatinine Clearance: 81.8 ml/min (by C-G formula based on Cr of 0.83).   Medical History: Past Medical History  Diagnosis Date  . Type II or unspecified type diabetes mellitus without mention of complication, not stated as uncontrolled   . Impotence of organic origin   . Cervicalgia   . Pure hypercholesterolemia   . Essential hypertension, benign   . Cancer     Prostate   Assessment: 79 year old male presents to [facility] with shortness of breath and near syncope. D-dimer positive and CTA positive for bilateraly pulmonary emboli and dilated right heart consistent with right heart strain. New orders to start IV heparin. No history of anticoagulants.   Patient taken for EKOS procedure last night and tPA has been continued up until ~0900 this morning. Heparin and tPA currently off and anticoagulation is on hold for sheath removal.  Heparin level was within desired range at 0.5 after rate adjustment early this morning. Will continue to follow for restart of heparin. No bleeding complications noted. Hgb is down 13.2>>11.8 and pltc has also trended down 168>>121.  Goal of Therapy:  Heparin level 0.3-0.7 units/ml Monitor platelets by anticoagulation protocol: Yes   Plan:  Follow up restart of  heparin after sheath pull  Erin Hearing PharmD., BCPS Clinical Pharmacist Pager [phone] 10/26/2013 10:38 AM

## 2013-10-26 NOTE — Progress Notes (Signed)
Completion of IV TPA in EKOS at 0643, NS infusing through TPA lines at 15 mL/hr. No signs of complications noted, Vitals WNL no signs of bleeding noted during shift.  Desmond Dike RN

## 2013-10-26 NOTE — Progress Notes (Signed)
PULMONARY / CRITICAL CARE MEDICINE   Name: [name] MRN: [id number] DOB: Nov 11, 1934    ADMISSION DATE:  10/25/2013   REFERRING MD :  EDP  CHIEF COMPLAINT:  SOB  INITIAL PRESENTATION:  [name] who represents with sob after being seen 7/24 in ED with syncope. He had CT angio chest with large saddle embolus with CT evidence of RV dysfunction (ratio 2) and PCCM asked to admit.  STUDIES:  CT chest 8/1 large saddle pe  SIGNIFICANT EVENTS: 8/1 catheter directed lysis 8/2 PA pressure 34  SUBJECTIVE: denies cp, dyspnea, able to ambulate once sheaths out Afebrile Mild hematuria  VITAL SIGNS: Temp:  [98.4 F (36.9 C)-99.5 F (37.5 C)] 99.2 F (37.3 C) (08/02 1200) Pulse Rate:  [41-141] 98 (08/02 1600) Resp:  [13-32] 21 (08/02 1600) BP: (116-169)/(68-107) 125/68 mmHg (08/02 1600) SpO2:  [88 %-100 %] 97 % (08/02 1600) Weight:  [88.45 kg (195 lb)] 88.45 kg (195 lb) (08/02 0500) HEMODYNAMICS:   VENTILATOR SETTINGS:   INTAKE / OUTPUT:  Intake/Output Summary (Last 24 hours) at 10/26/13 1643 Last data filed at 10/26/13 1600  Gross per 24 hour  Intake 2217.7 ml  Output   2020 ml  Net  197.7 ml    PHYSICAL EXAMINATION: General:  wmwd aam nad AT REST Neuro:  iNTACT HEENT:  Poor dentation, No JVD/LAN Cardiovascular:  HSR RRR Lungs: CTA Abdomen:  Soft ni. +BS Musculoskeletal:  Intact, left proximal head of bicep ruptured x 1 year, Large lipoma left shoulder Skin:  Warm and dry  LABS:  CBC  Recent Labs Lab 10/26/13 0100 10/26/13 0802 10/26/13 1420  WBC 4.7 5.4 5.3  HGB 13.2 11.8* 11.2*  HCT 38.9* 34.8* 33.4*  PLT 120* 121* 119*   Coag's  Recent Labs Lab 10/25/13 1655 10/26/13 0100  APTT 137*  --   INR 1.29 1.26   BMET  Recent Labs Lab 10/25/13 1142 10/25/13 1655 10/26/13 0100  NA 135* 138 139  K 4.0 3.9 3.8  CL 101 100 105  CO2 21 19 18*  BUN 16 13 12   CREATININE 0.98 0.88 0.83  GLUCOSE 148* 110* 136*   Electrolytes  Recent Labs Lab  10/25/13 1142 10/25/13 1655 10/26/13 0100  CALCIUM 9.2 8.6 8.3*  MG  --  1.7  --   PHOS  --  3.2  --    Sepsis Markers  Recent Labs Lab 10/25/13 1655  LATICACIDVEN 1.6  PROCALCITON <0.10   ABG No results found for this basename: PHART, PCO2ART, PO2ART,  in the last 168 hours Liver Enzymes  Recent Labs Lab 10/25/13 1655  AST 21  ALT 20  ALKPHOS 169*  BILITOT 0.4  ALBUMIN 3.6   Cardiac Enzymes  Recent Labs Lab 10/25/13 1234  PROBNP 2305.0*   Glucose  Recent Labs Lab 10/25/13 2126  GLUCAP 99     ASSESSMENT / PLAN:  PULMONARY  A Large saddle PE with RV dysfunction on CT s/p catheter directed tpa lysis Hx of prostate cancer  P:  Heparin drip Hold off coumadin x 24h given hematuria   CARDIOVASCULAR Echo 8/1>> dilated RV, PA 66 mm , on 8/2 decreased to 42mm on angio A:  HTN P:  Hold antihypertensive    HEMATOLOGIC A:   Hx of prostate ca 5 years ago , followed by Dr. Risa Grill P:  Check PSA for completeness , monitor hematuria    NEUROLOGIC A:   Recent syncope 7/24 with neg ct head Sz DO P:   Continue  sz meds  TODAY'S SUMMARY: Large saddle embolus s/p catheter lysis with good response, monitor hematuria on heparin & start coumadin in 24h Transfer totele  I have personally obtained a history, examined the patient, evaluated laboratory and imaging results, formulated the assessment and plan and placed orders.   Rigoberto Noel  MD Pulmonary and [place] Pager: [phone]  10/26/2013, 4:43 PM

## 2013-10-26 NOTE — Sedation Documentation (Signed)
Sheaths pulled by Loraine Leriche, RT.  Pressure being held.

## 2013-10-26 NOTE — Sedation Documentation (Signed)
Received to IR 1 for EKOS PE lysis check.

## 2013-10-26 NOTE — Sedation Documentation (Signed)
Hematoma noted R groin, checked with [place] by Dr Barbie Banner

## 2013-10-26 NOTE — Progress Notes (Signed)
[place] for heparin Indication: pulmonary embolus  No Known Allergies  Patient Measurements: Height: 6' (182.9 cm) Weight: 195 lb (88.45 kg) IBW/kg (Calculated) : 77.6 Heparin Dosing Weight: 88kg  Vital Signs: Temp: 97.6 F (36.4 C) (08/02 1828) Temp src: Oral (08/02 1828) BP: 140/83 mmHg (08/02 1800) Pulse Rate: 93 (08/02 1800)  Labs:  Recent Labs  10/25/13 1142 10/25/13 1655 10/26/13 0100 10/26/13 0802 10/26/13 1420 10/26/13 1745  HGB 15.4 14.9 13.2 11.8* 11.2* 11.6*  HCT 45.2 43.5 38.9* 34.8* 33.4* 35.0*  PLT 168 155 120* 121* 119* 135*  APTT  --  137*  --   --   --   --   LABPROT  --  16.1* 15.8*  --   --   --   INR  --  1.29 1.26  --   --   --   HEPARINUNFRC  --   --  0.76* 0.56  --  0.15*  CREATININE 0.98 0.88 0.83  --   --   --     Estimated Creatinine Clearance: 81.8 ml/min (by C-G formula based on Cr of 0.83).   Medical History: Past Medical History  Diagnosis Date  . Type II or unspecified type diabetes mellitus without mention of complication, not stated as uncontrolled   . Impotence of organic origin   . Cervicalgia   . Pure hypercholesterolemia   . Essential hypertension, benign   . Cancer     Prostate   Assessment: 79 year old male presents to [facility] with shortness of breath and near syncope. D-dimer positive and CTA positive for bilateraly pulmonary emboli and dilated right heart consistent with right heart strain.   Patient taken for EKOS procedure last night and tPA has been continued up until ~0900 this morning. Sheath removed and heparin resumed between 1230-1300 today. Heparin level 0.15 now less than goal. R groin hematoma note today. Mild hematuria. Hold on Coumadin another day.  Goal of Therapy:  Heparin level 0.3-0.7 units/ml Monitor platelets by anticoagulation protocol: Yes   Plan:  Increase heparin slightly to 1450 units/hr Recheck heparin level in 6 hrs.  Apolo Cutshaw S. Alford Highland, PharmD,  [facility] Clinical Staff Pharmacist Pager [phone]  10/26/2013 7:10 PM

## 2013-10-26 NOTE — Sedation Documentation (Signed)
Dr. Hoss at bedside 

## 2013-10-26 NOTE — Sedation Documentation (Signed)
Right Pulmonary Artery 22/11    16 Left Pulmonary Artery  29/4      12

## 2013-10-26 NOTE — Progress Notes (Signed)
Bilateral lower extremity venous duplex completed:  No obvious evidence of DVT, superficial thrombosis, or Baker's cyst in the visualized vessels.  Right common femoral , proximal femoral, and profunda veins not visualized due to bandages.

## 2013-10-26 NOTE — Progress Notes (Signed)
ANTICOAGULATION CONSULT NOTE - Follow Up Consult  Pharmacy Consult for heparin Indication: pulmonary embolus  Labs:  Recent Labs  10/25/13 1142 10/25/13 1655 10/26/13 0100  HGB 15.4 14.9 13.2  HCT 45.2 43.5 38.9*  PLT 168 155 120*  APTT  --  137*  --   LABPROT  --  16.1* 15.8*  INR  --  1.29 1.26  HEPARINUNFRC  --   --  0.76*  CREATININE 0.98 0.88 0.83    Assessment: 79yo male supratherapeutic on heparin with initial dosing for PE, tPA running for lysis.  Goal of Therapy:  Heparin level 0.3-0.7 units/ml   Plan:  Will decrease heparin gtt by ~2 units/kg/hr to 1200 units/hr and check level with next scheduled lab draw.  Wynona Neat, PharmD, BCPS  10/26/2013,2:08 AM

## 2013-10-27 DIAGNOSIS — I10 Essential (primary) hypertension: Secondary | ICD-10-CM

## 2013-10-27 LAB — CBC
HCT: 31.5 % — ABNORMAL LOW (ref 39.0–52.0)
Hemoglobin: 10.7 g/dL — ABNORMAL LOW (ref 13.0–17.0)
MCH: 32.2 pg (ref 26.0–34.0)
MCHC: 34 g/dL (ref 30.0–36.0)
MCV: 94.9 fL (ref 78.0–100.0)
Platelets: 116 10*3/uL — ABNORMAL LOW (ref 150–400)
RBC: 3.32 MIL/uL — ABNORMAL LOW (ref 4.22–5.81)
RDW: 13.2 % (ref 11.5–15.5)
WBC: 4.7 10*3/uL (ref 4.0–10.5)

## 2013-10-27 LAB — HEPARIN LEVEL (UNFRACTIONATED)
Heparin Unfractionated: 0.36 IU/mL (ref 0.30–0.70)
Heparin Unfractionated: <0.1 IU/mL — ABNORMAL LOW (ref 0.30–0.70)
Heparin Unfractionated: 0.59 IU/mL (ref 0.30–0.70)

## 2013-10-27 LAB — PSA: PSA: 1.27 ng/mL (ref <=4.00)

## 2013-10-27 MED ORDER — PATIENT'S GUIDE TO USING COUMADIN BOOK
Freq: Once | Status: AC
  Administered 2013-10-27: 11:00:00
  Filled 2013-10-27: qty 1

## 2013-10-27 MED ORDER — WARFARIN VIDEO
Freq: Once | Status: AC
  Administered 2013-10-27: 11:00:00

## 2013-10-27 MED ORDER — WARFARIN - PHARMACIST DOSING INPATIENT
Freq: Every day | Status: DC
  Administered 2013-10-27 – 2013-10-30 (×4)

## 2013-10-27 MED ORDER — WARFARIN SODIUM 5 MG PO TABS
5.0000 mg | ORAL_TABLET | Freq: Once | ORAL | Status: AC
Start: 2013-10-27 — End: 2013-10-27
  Administered 2013-10-27: 5 mg via ORAL
  Filled 2013-10-27: qty 1

## 2013-10-27 NOTE — Progress Notes (Signed)
Subjective: Pt doing well; denies CP,dyspnea, hemoptysis; hematuria resolved  Objective: Vital signs in last 24 hours: Temp:  [97.6 F (36.4 C)-98.9 F (37.2 C)] 98.9 F (37.2 C) (08/03 0421) Pulse Rate:  [41-98] 93 (08/03 0421) Resp:  [18-31] 18 (08/03 0421) BP: (123-151)/(68-94) 129/71 mmHg (08/03 0421) SpO2:  [91 %-100 %] 99 % (08/03 0421) Weight:  [190 lb 14.4 oz (86.592 kg)] 190 lb 14.4 oz (86.592 kg) (08/03 0421) Last BM Date: 10/26/13  Intake/Output from previous day: 08/02 0701 - 08/03 0700 In: 553 [I.V.:553] Out: 621 [Urine:620; Stool:1] Intake/Output this shift:    Puncture site RT CFV soft, clean and dry, mildly tender; no hematoma  Lab Results:   Recent Labs  10/26/13 1745 10/27/13 0110  WBC 5.8 4.7  HGB 11.6* 10.7*  HCT 35.0* 31.5*  PLT 135* 116*   BMET  Recent Labs  10/25/13 1655 10/26/13 0100  NA 138 139  K 3.9 3.8  CL 100 105  CO2 19 18*  GLUCOSE 110* 136*  BUN 13 12  CREATININE 0.88 0.83  CALCIUM 8.6 8.3*   PT/INR  Recent Labs  10/25/13 1655 10/26/13 0100  LABPROT 16.1* 15.8*  INR 1.29 1.26   ABG No results found for this basename: PHART, PCO2, PO2, HCO3,  in the last 72 hours  Studies/Results: Dg Chest 2 View  10/25/2013   CLINICAL DATA:  Syncope, short of breath  EXAM: CHEST  2 VIEW  COMPARISON:  Prior chest x-ray 01/17/2011; prior chest CT 01/18/2011  FINDINGS: Stable borderline cardiomegaly. Atherosclerotic and tortuous thoracic aorta. Mild central bronchitic changes and chronic increased lung markings in the right perihilar region dating back to October of 2012. No focal airspace consolidation, pleural effusion or pneumothorax. No suspicious pulmonary nodule. No acute osseous abnormality. Degenerative spurring throughout the thoracic spine.  IMPRESSION: Stable chest x-ray without evidence acute cardiopulmonary process.   Electronically Signed   By: Jacqulynn Cadet M.D.   On: 10/25/2013 13:51   Ct Angio Chest Pe W/cm &/or Wo  Cm  10/25/2013   CLINICAL DATA:  Syncope  EXAM: CT ANGIOGRAPHY CHEST WITH CONTRAST  TECHNIQUE: Multidetector CT imaging of the chest was performed using the standard protocol during bolus administration of intravenous contrast. Multiplanar CT image reconstructions and MIPs were obtained to evaluate the vascular anatomy.  CONTRAST:  41mL OMNIPAQUE IOHEXOL 350 MG/ML SOLN  COMPARISON:  None.  FINDINGS: There is extensive bilateral pulmonary thromboembolism as well as saddle embolus extending across the bifurcation. Thrombus extends into the main right pulmonary artery and lobar branches. There is thrombus in the main left pulmonary artery extending into the left upper and lower lobe lobar branches. Right ventricle to left ventricle sub valvular diameter ratio is 2.0. The right atrium is dilated. There is reflux of contrast into the hepatic veins and IVC. These findings are compatible with right heart dysfunction.  Small mediastinal nodes.  No obvious aortic dissection.  No pneumothorax.  No pleural effusion.  Dependent atelectasis at the lung bases.  No acute bony deformity.  Review of the MIP images confirms the above findings.  IMPRESSION: Study is positive for pulmonary thromboembolism and right heart strain. Critical Value/emergent results were called by telephone at the time of interpretation on 10/25/2013 at 3:42 pm to Dr. Tanna Furry , who verbally acknowledged these results.   Electronically Signed   By: Maryclare Bean M.D.   On: 10/25/2013 15:42   Ir Angiogram Pulmonary Bilateral Selective  10/26/2013   CLINICAL DATA:  Sub mass of  pulmonary thromboembolism. Right heart strain. Dyspnea upon walking 10 feet.  EXAM: IR ULTRASOUND GUIDANCE VASC ACCESS RIGHT; IR INFUSION THROMBOL ARTERIAL INITIAL (MS); BILATERAL PULMONARY ARTERIOGRAPHY  FLUOROSCOPY TIME:  13 min and 6 seconds.  MEDICATIONS AND MEDICAL HISTORY: Versed 1 mg, Fentanyl 50 mcg.  Additional Medications: None.  ANESTHESIA/SEDATION: Moderate sedation time: 48  minutes  CONTRAST:  20 cc Omnipaque 300  PROCEDURE: The procedure, risks, benefits, and alternatives were explained to the patient. Questions regarding the procedure were encouraged and answered. The patient understands and consents to the procedure.  The right groin was prepped with Betadine in a sterile fashion, and a sterile drape was applied covering the operative field. A sterile gown and sterile gloves were used for the procedure.  Under sonographic guidance, a micropuncture needle was inserted into the right common femoral vein and removed over a 018 wire. This was up sized to a Bentson. A 6 French sheath was inserted. A second sheath was inserted into the right common femoral vein.  A 100 cm David catheter was advanced over a Bentson wire into the main pulmonary artery. Pulmonary artery pressure measurements were obtained. Mean arterial pressure was 34 mm Hg.  The catheter was then advanced over a glidewire into the right pulmonary artery. Contrast was injected for pulmonary ear arteriography. It was removed over a Rosen wire.  The identical procedure was performed with an additional catheter into the left pulmonary artery. The catheter was advanced into the descending branch of the left lower lobe. The catheter was removed over a Rosen wire.  Bilateral 12 cm EKOS infusion catheters were advanced over the Rosen wires.  TPA was begun at 1 milligram/hour per catheter. The sheaths and catheters were secured in place.  FINDINGS: Right pulmonary arteriography demonstrates occlusive thrombus in the peripheral main right pulmonary artery. The thrombus in the main left pulmonary artery is partially occlusive.  COMPLICATIONS: None  IMPRESSION: Successful institution of bilateral pulmonary artery ultrasonic assisted lysis.   Electronically Signed   By: Maryclare Bean M.D.   On: 10/26/2013 09:01   Ir US Guide Vasc Access Right  10/26/2013   CLINICAL DATA:  Sub mass of pulmonary thromboembolism. Right heart strain. Dyspnea  upon walking 10 feet.  EXAM: IR ULTRASOUND GUIDANCE VASC ACCESS RIGHT; IR INFUSION THROMBOL ARTERIAL INITIAL (MS); BILATERAL PULMONARY ARTERIOGRAPHY  FLUOROSCOPY TIME:  13 min and 6 seconds.  MEDICATIONS AND MEDICAL HISTORY: Versed 1 mg, Fentanyl 50 mcg.  Additional Medications: None.  ANESTHESIA/SEDATION: Moderate sedation time: 48 minutes  CONTRAST:  20 cc Omnipaque 300  PROCEDURE: The procedure, risks, benefits, and alternatives were explained to the patient. Questions regarding the procedure were encouraged and answered. The patient understands and consents to the procedure.  The right groin was prepped with Betadine in a sterile fashion, and a sterile drape was applied covering the operative field. A sterile gown and sterile gloves were used for the procedure.  Under sonographic guidance, a micropuncture needle was inserted into the right common femoral vein and removed over a 018 wire. This was up sized to a Bentson. A 6 French sheath was inserted. A second sheath was inserted into the right common femoral vein.  A 100 cm David catheter was advanced over a Bentson wire into the main pulmonary artery. Pulmonary artery pressure measurements were obtained. Mean arterial pressure was 34 mm Hg.  The catheter was then advanced over a glidewire into the right pulmonary artery. Contrast was injected for pulmonary ear arteriography. It was removed over a  Rosen wire.  The identical procedure was performed with an additional catheter into the left pulmonary artery. The catheter was advanced into the descending branch of the left lower lobe. The catheter was removed over a Rosen wire.  Bilateral 12 cm EKOS infusion catheters were advanced over the Rosen wires.  TPA was begun at 1 milligram/hour per catheter. The sheaths and catheters were secured in place.  FINDINGS: Right pulmonary arteriography demonstrates occlusive thrombus in the peripheral main right pulmonary artery. The thrombus in the main left pulmonary artery  is partially occlusive.  COMPLICATIONS: None  IMPRESSION: Successful institution of bilateral pulmonary artery ultrasonic assisted lysis.   Electronically Signed   By: Maryclare Bean M.D.   On: 10/26/2013 09:01   Ir Infusion Thrombol Arterial Initial (ms)  10/26/2013   CLINICAL DATA:  Sub mass of pulmonary thromboembolism. Right heart strain. Dyspnea upon walking 10 feet.  EXAM: IR ULTRASOUND GUIDANCE VASC ACCESS RIGHT; IR INFUSION THROMBOL ARTERIAL INITIAL (MS); BILATERAL PULMONARY ARTERIOGRAPHY  FLUOROSCOPY TIME:  13 min and 6 seconds.  MEDICATIONS AND MEDICAL HISTORY: Versed 1 mg, Fentanyl 50 mcg.  Additional Medications: None.  ANESTHESIA/SEDATION: Moderate sedation time: 48 minutes  CONTRAST:  20 cc Omnipaque 300  PROCEDURE: The procedure, risks, benefits, and alternatives were explained to the patient. Questions regarding the procedure were encouraged and answered. The patient understands and consents to the procedure.  The right groin was prepped with Betadine in a sterile fashion, and a sterile drape was applied covering the operative field. A sterile gown and sterile gloves were used for the procedure.  Under sonographic guidance, a micropuncture needle was inserted into the right common femoral vein and removed over a 018 wire. This was up sized to a Bentson. A 6 French sheath was inserted. A second sheath was inserted into the right common femoral vein.  A 100 cm David catheter was advanced over a Bentson wire into the main pulmonary artery. Pulmonary artery pressure measurements were obtained. Mean arterial pressure was 34 mm Hg.  The catheter was then advanced over a glidewire into the right pulmonary artery. Contrast was injected for pulmonary ear arteriography. It was removed over a Rosen wire.  The identical procedure was performed with an additional catheter into the left pulmonary artery. The catheter was advanced into the descending branch of the left lower lobe. The catheter was removed over a Rosen  wire.  Bilateral 12 cm EKOS infusion catheters were advanced over the Rosen wires.  TPA was begun at 1 milligram/hour per catheter. The sheaths and catheters were secured in place.  FINDINGS: Right pulmonary arteriography demonstrates occlusive thrombus in the peripheral main right pulmonary artery. The thrombus in the main left pulmonary artery is partially occlusive.  COMPLICATIONS: None  IMPRESSION: Successful institution of bilateral pulmonary artery ultrasonic assisted lysis.   Electronically Signed   By: Maryclare Bean M.D.   On: 10/26/2013 09:01   Ir Jacolyn Reedy F/u Eval Art/ven Final Day (ms)  10/26/2013   CLINICAL DATA:  Pulmonary embolism lysis  EXAM: IR THROMB F/U EVAL ART/VEN FINAL DAY  FLUOROSCOPY TIME:  12 seconds.  MEDICATIONS AND MEDICAL HISTORY: None  ANESTHESIA/SEDATION: None  CONTRAST:  None  PROCEDURE: The procedure, risks, benefits, and alternatives were explained to the patient. Questions regarding the procedure were encouraged and answered. The patient understands and consents to the procedure.  The internal wire from the right pulmonary artery infusion catheter was removed. Pressure measurements in the right pulmonary artery were obtained. The identical procedure was performed  for the left pulmonary artery. Right and left mean arterial pressures are 16 and 12 mm of mercury respectively. The catheters and sheaths were removed.  FINDINGS: Initial imaging confirms stable position of the right and left pulmonary artery catheters.  COMPLICATIONS: None  IMPRESSION: Marked improvement in the pulmonary artery pressure after 12 hr of pulmonary artery embolus lysis. Anti coagulation will be resumed. Repeat CT angiogram of the pulmonary arteries will be performed at the 48 hr interval.   Electronically Signed   By: Maryclare Bean M.D.   On: 10/26/2013 12:15    Anti-infectives: Anti-infectives   None      Assessment/Plan: s/p completion of PE catheter directed lysis 8/2; hep to coumadin transition per CCM;  check f/u CTA chest/PE eval 8/4  LOS: 2 days    ALLRED,D [facility] 10/27/2013

## 2013-10-27 NOTE — Progress Notes (Addendum)
[facility] for Heparin / Coumadin Indication: pulmonary embolus  No Known Allergies  Patient Measurements: Height: 6' (182.9 cm) Weight: 190 lb 14.4 oz (86.592 kg) IBW/kg (Calculated) : 77.6 Heparin Dosing Weight: 88kg  Vital Signs: Temp: 98.9 F (37.2 C) (08/03 0421) Temp src: Oral (08/03 0421) BP: 129/71 mmHg (08/03 0421) Pulse Rate: 93 (08/03 0421)  Labs:  Recent Labs  10/25/13 1142 10/25/13 1655 10/26/13 0100  10/26/13 1420 10/26/13 1745 10/27/13 0110 10/27/13 0728  HGB 15.4 14.9 13.2  < > 11.2* 11.6* 10.7*  --   HCT 45.2 43.5 38.9*  < > 33.4* 35.0* 31.5*  --   PLT 168 155 120*  < > 119* 135* 116*  --   APTT  --  137*  --   --   --   --   --   --   LABPROT  --  16.1* 15.8*  --   --   --   --   --   INR  --  1.29 1.26  --   --   --   --   --   HEPARINUNFRC  --   --  0.76*  < >  --  0.15* 0.36 <0.10*  CREATININE 0.98 0.88 0.83  --   --   --   --   --   < > = values in this interval not displayed.  Estimated Creatinine Clearance: 81.8 ml/min (by C-G formula based on Cr of 0.83).   Medical History: Past Medical History  Diagnosis Date  . Type II or unspecified type diabetes mellitus without mention of complication, not stated as uncontrolled   . Impotence of organic origin   . Cervicalgia   . Pure hypercholesterolemia   . Essential hypertension, benign   . Cancer     Prostate   Assessment: 79 year old male presents to [facility] with shortness of breath and near syncope. D-dimer positive and CTA positive for bilateraly pulmonary emboli and dilated right heart consistent with right heart strain.   Patient taken for EKOS procedure last night and tPA has been continued up until  9 am 8/2  Hematuria now resolved -- Day 1 of 5 of Coumadin / Heparin overlap   Goal of Therapy:  Heparin level 0.3-0.7 units/ml Monitor platelets by anticoagulation protocol: Yes INR = 2 to 3   Plan:  Increase heparin slightly to 1650 units /  hr Recheck heparin level in 6 hrs. Coumadin 5 mg po x 1 dose Daily INR  Thank you Anette Guarneri, PharmD [phone]  10/27/2013 10:50 AM  Addum:  Heparin level 0.59 units/ml.  Cont drip at 1650 units/hr and recheck in am.  Excell Seltzer, PharmD

## 2013-10-27 NOTE — Progress Notes (Signed)
PULMONARY / CRITICAL CARE MEDICINE   Name: [name] MRN: [id number] DOB: 06/14/1934    ADMISSION DATE:  10/25/2013   REFERRING MD :  EDP  CHIEF COMPLAINT:  SOB  INITIAL PRESENTATION:  [name] who represents with sob after being seen 7/24 in ED with syncope. He had CT angio chest with large saddle embolus with CT evidence of RV dysfunction (ratio 2) and PCCM asked to admit.  STUDIES:  CT chest 8/1 large saddle pe BLE dopplers 8/2>>>neg DVT  SIGNIFICANT EVENTS: 8/1 catheter directed lysis 8/2 PA pressure 34  SUBJECTIVE:  Hematuria resolved. Denies chest pain, SOB, lightheadedness, bleeding. Ambulating well.   VITAL SIGNS: Temp:  [97.6 F (36.4 C)-99.2 F (37.3 C)] 98.9 F (37.2 C) (08/03 0421) Pulse Rate:  [41-108] 93 (08/03 0421) Resp:  [17-32] 18 (08/03 0421) BP: (123-160)/(68-101) 129/71 mmHg (08/03 0421) SpO2:  [91 %-100 %] 99 % (08/03 0421) Weight:  [190 lb 14.4 oz (86.592 kg)] 190 lb 14.4 oz (86.592 kg) (08/03 0421)    INTAKE / OUTPUT:  Intake/Output Summary (Last 24 hours) at 10/27/13 1027 Last data filed at 10/27/13 0421  Gross per 24 hour  Intake    163 ml  Output    401 ml  Net   -238 ml    PHYSICAL EXAMINATION: General:  wmwd aam NAD in bed  Neuro:  Awake, alert, appropriate, MAE  HEENT:  Poor dentation, No JVD/LAN Cardiovascular:  HSR RRR Lungs: resps even non labored on RA, CTA Abdomen:  Soft ni. +BS Musculoskeletal:  Intact, R groin previous sheath dressing c/d Skin:  Warm and dry  LABS:  CBC  Recent Labs Lab 10/26/13 1420 10/26/13 1745 10/27/13 0110  WBC 5.3 5.8 4.7  HGB 11.2* 11.6* 10.7*  HCT 33.4* 35.0* 31.5*  PLT 119* 135* 116*   Coag's  Recent Labs Lab 10/25/13 1655 10/26/13 0100  APTT 137*  --   INR 1.29 1.26   BMET  Recent Labs Lab 10/25/13 1142 10/25/13 1655 10/26/13 0100  NA 135* 138 139  K 4.0 3.9 3.8  CL 101 100 105  CO2 21 19 18*  BUN 16 13 12   CREATININE 0.98 0.88 0.83  GLUCOSE 148* 110* 136*    Electrolytes  Recent Labs Lab 10/25/13 1142 10/25/13 1655 10/26/13 0100  CALCIUM 9.2 8.6 8.3*  MG  --  1.7  --   PHOS  --  3.2  --    Sepsis Markers  Recent Labs Lab 10/25/13 1655  LATICACIDVEN 1.6  PROCALCITON <0.10   ABG No results found for this basename: PHART, PCO2ART, PO2ART,  in the last 168 hours Liver Enzymes  Recent Labs Lab 10/25/13 1655  AST 21  ALT 20  ALKPHOS 169*  BILITOT 0.4  ALBUMIN 3.6   Cardiac Enzymes  Recent Labs Lab 10/25/13 1234  PROBNP 2305.0*   Glucose  Recent Labs Lab 10/25/13 2126  GLUCAP 99     ASSESSMENT / PLAN:  PULMONARY Large saddle PE with RV dysfunction on CT s/p catheter directed tpa lysis Hx of prostate cancer PLAN -  Cont Heparin drip per pharmacy  Will start coumadin 8/3 with resolved hematuria  ?NOAC v coumadin    CARDIOVASCULAR Echo 8/1>> dilated RV, PA 66 mm , on 8/2 decreased to 31mm on angio Hx HTN A:  HTN P:  Cont hold home antihypertensive (lisinopril)   HEMATOLOGIC A:   Hx of prostate ca 5 years ago , followed by Dr. Risa Grill P:  PSA pending  NEUROLOGIC A:   Recent syncope 7/24 with neg ct head Sz DO P:   Continue  sz meds   TODAY'S SUMMARY: Large saddle embolus s/p catheter lysis with good response.  Hematuria resolved.  Continue heparin gtt and begin coumadin with heparin bridge.     Anthony Madrid, NP 10/27/2013  10:27 AM Pager: [phone] or [phone]  *Care during the described time interval was provided by me and/or other providers on the critical care team. I have reviewed this patient's available data, including medical history, events of note, physical examination and test results as part of my evaluation.   Attending:  I have seen and examined the patient with nurse practitioner/resident and agree with the note above.   Have asked case manager to see him regarding cost of newer anticoagulants vs warfarin.  Roselie Awkward, MD [place] [facility] Pager:  [phone] Cell: [phone] If no response, call [phone]

## 2013-10-27 NOTE — Care Management Note (Unsigned)
    Page 1 of 1   10/28/2013     4:33:57 PM CARE MANAGEMENT NOTE 10/28/2013  Patient:  [name], [name]   Account Number:  [id number]  Date Initiated:  10/25/2013  Documentation initiated by:  Luz Lex  Subjective/Objective Assessment:   Admitted with PE with RV strain.     Action/Plan:   Anticipated DC Date:  10/28/2013   Anticipated DC Plan:  [place]  CM consult      Choice offered to / List presented to:             Status of service:  In process, will continue to follow Medicare Important Message given?  YES (If response is "NO", the following Medicare IM given date fields will be blank) Date Medicare IM given:  10/28/2013 Medicare IM given by:  Loxley Schmale Date Additional Medicare IM given:   Additional Medicare IM given by:    Discharge Disposition:    Per UR Regulation:  Reviewed for med. necessity/level of care/duration of stay  If discussed at [place] of Stay Meetings, dates discussed:    Comments:  ContactBlaize, Epple Brother [phone]   10/27/13 Ellan Lambert, RN, BSN [phone] Spoke with pt regarding Medicare and Rx drug coverage.  Pt has NO PART D RX DRUG PLAN for Medicare, and states he has to pay full price for all medications.  He does have Medicare A/B, and a primary MD at [facility].  We could consider using Coumadin with this pt for affordability, ($4 at [facility]), with follow up at his PCP for Coumadin bloodwork, or patient assistance programs with either Xarelto or Eliquis.  Pt would have to meet eligiblitly requirements for theses programs, and would be responsible for mailing in financial documentation for his application to be considered.  He may not follow up on this.  Pt could, of course receive free 30 day trial cards for meds.  MD, please notify this Case Mgr with decision. thanks.

## 2013-10-27 NOTE — Progress Notes (Signed)
ANTICOAGULATION CONSULT NOTE - Follow Up Consult  Pharmacy Consult for heparin Indication: pulmonary embolus  Labs:  Recent Labs  10/25/13 1142 10/25/13 1655  10/26/13 0100 10/26/13 0802 10/26/13 1420 10/26/13 1745 10/27/13 0110  HGB 15.4 14.9  --  13.2 11.8* 11.2* 11.6* 10.7*  HCT 45.2 43.5  --  38.9* 34.8* 33.4* 35.0* 31.5*  PLT 168 155  --  120* 121* 119* 135* 116*  APTT  --  137*  --   --   --   --   --   --   LABPROT  --  16.1*  --  15.8*  --   --   --   --   INR  --  1.29  --  1.26  --   --   --   --   HEPARINUNFRC  --   --   < > 0.76* 0.56  --  0.15* 0.36  CREATININE 0.98 0.88  --  0.83  --   --   --   --   < > = values in this interval not displayed.   Assessment/Plan:  79yo male therapeutic on heparin after rate increase. Will continue gtt at current rate and confirm stable with additional level.   Wynona Neat, PharmD, BCPS  10/27/2013,3:18 AM

## 2013-10-28 ENCOUNTER — Inpatient Hospital Stay (HOSPITAL_COMMUNITY): Payer: Medicare Other

## 2013-10-28 LAB — BASIC METABOLIC PANEL WITH GFR
Anion gap: 11 (ref 5–15)
BUN: 8 mg/dL (ref 6–23)
CO2: 24 meq/L (ref 19–32)
Calcium: 8.7 mg/dL (ref 8.4–10.5)
Chloride: 107 meq/L (ref 96–112)
Creatinine, Ser: 0.89 mg/dL (ref 0.50–1.35)
GFR calc Af Amer: >90 mL/min
GFR calc non Af Amer: 80 mL/min — ABNORMAL LOW
Glucose, Bld: 96 mg/dL (ref 70–99)
Potassium: 4.2 meq/L (ref 3.7–5.3)
Sodium: 142 meq/L (ref 137–147)

## 2013-10-28 LAB — HEPARIN LEVEL (UNFRACTIONATED)
HEPARIN UNFRACTIONATED: 0.4 [IU]/mL (ref 0.30–0.70)
Heparin Unfractionated: 0.75 [IU]/mL — ABNORMAL HIGH (ref 0.30–0.70)
Heparin Unfractionated: 1.09 IU/mL — ABNORMAL HIGH (ref 0.30–0.70)

## 2013-10-28 LAB — CBC
HCT: 30.6 % — ABNORMAL LOW (ref 39.0–52.0)
Hemoglobin: 10 g/dL — ABNORMAL LOW (ref 13.0–17.0)
MCH: 31.3 pg (ref 26.0–34.0)
MCHC: 32.7 g/dL (ref 30.0–36.0)
MCV: 95.6 fL (ref 78.0–100.0)
Platelets: 148 K/uL — ABNORMAL LOW (ref 150–400)
RBC: 3.2 MIL/uL — ABNORMAL LOW (ref 4.22–5.81)
RDW: 13.3 % (ref 11.5–15.5)
WBC: 3.8 K/uL — ABNORMAL LOW (ref 4.0–10.5)

## 2013-10-28 LAB — PROTIME-INR
INR: 1.18 (ref 0.00–1.49)
Prothrombin Time: 15 s (ref 11.6–15.2)

## 2013-10-28 MED ORDER — IOHEXOL 350 MG/ML SOLN
80.0000 mL | Freq: Once | INTRAVENOUS | Status: AC | PRN
  Administered 2013-10-28: 80 mL via INTRAVENOUS

## 2013-10-28 MED ORDER — WARFARIN SODIUM 5 MG PO TABS
5.0000 mg | ORAL_TABLET | Freq: Once | ORAL | Status: AC
  Administered 2013-10-28: 5 mg via ORAL
  Filled 2013-10-28: qty 1

## 2013-10-28 NOTE — Progress Notes (Addendum)
ANTICOAGULATION CONSULT NOTE  Pharmacy Consult for Heparin / Coumadin Indication: pulmonary embolus  No Known Allergies  Labs:  Recent Labs  10/25/13 1655 10/26/13 0100  10/26/13 1745 10/27/13 0110  10/27/13 1625 10/28/13 0420 10/28/13 1400  HGB 14.9 13.2  < > 11.6* 10.7*  --   --  10.0*  --   HCT 43.5 38.9*  < > 35.0* 31.5*  --   --  30.6*  --   PLT 155 120*  < > 135* 116*  --   --  148*  --   APTT 137*  --   --   --   --   --   --   --   --   LABPROT 16.1* 15.8*  --   --   --   --   --  15.0  --   INR 1.29 1.26  --   --   --   --   --  1.18  --   HEPARINUNFRC  --  0.76*  < > 0.15* 0.36  < > 0.59 0.75* 1.09*  CREATININE 0.88 0.83  --   --   --   --   --  0.89  --   < > = values in this interval not displayed.  Estimated Creatinine Clearance: 76.3 ml/min (by C-G formula based on Cr of 0.89).    Assessment: 79 year old male presents to [facility] with shortness of breath and near syncope. D-dimer positive and CTA positive for bilateraly pulmonary emboli and dilated right heart consistent with right heart strain.   Patient taken for EKOS procedure last night and tPA has been continued up until  9 am 8/2  Hematuria now resolved -- Day 2 of 5 of Coumadin / Heparin overlap   Goal of Therapy:  Heparin level 0.3-0.7 units/ml Monitor platelets by anticoagulation protocol: Yes INR = 2 to 3   Plan:  Decrease heparin  to 1250 units / hr (after holding for 1 hour)  Recheck heparin level in 8 hrs. Coumadin 5 mg po x 1 dose Daily INR  Thank you Anette Guarneri, PharmD [phone]  10/28/2013 2:54 PM

## 2013-10-28 NOTE — Progress Notes (Signed)
PULMONARY / CRITICAL CARE MEDICINE   Name: [name] MRN: [id number] DOB: 1934-11-17    ADMISSION DATE:  10/25/2013   REFERRING MD :  EDP  CHIEF COMPLAINT:  SOB  INITIAL PRESENTATION:  79 y/o AAM who represents with sob after being seen 7/24 in ED with syncope. He had CT angio chest with large saddle embolus with CT evidence of RV dysfunction (ratio 2) and PCCM asked to admit.  STUDIES:  8/01 - CT chest >> large saddle pe 8/02 - BLE dopplers >> neg DVT  SIGNIFICANT EVENTS: 8/01 - catheter directed lysis 8/02 - PA pressure 34  SUBJECTIVE:  Pt denies chest pain, hemoptysis.  Reports mild dyspnea on exertion.    VITAL SIGNS: Temp:  [97.8 F (36.6 C)-98.6 F (37 C)] 97.8 F (36.6 C) (08/04 0440) Pulse Rate:  [83-88] 88 (08/04 0440) Resp:  [18] 18 (08/04 0440) BP: (132-151)/(72-78) 151/72 mmHg (08/04 0440) SpO2:  [94 %-99 %] 94 % (08/04 0440) Weight:  [188 lb 7.9 oz (85.5 kg)] 188 lb 7.9 oz (85.5 kg) (08/04 0440)    INTAKE / OUTPUT:  Intake/Output Summary (Last 24 hours) at 10/28/13 1025 Last data filed at 10/27/13 1900  Gross per 24 hour  Intake  388.5 ml  Output    175 ml  Net  213.5 ml    PHYSICAL EXAMINATION: General:  wdwd aam NAD in bed  Neuro:  Awake, alert, appropriate, MAE  HEENT:  Poor dentation, No JVD/LAN Cardiovascular:  s1s2 RRR Lungs: resps even non labored on RA, CTA Abdomen:  Soft ni. +BS Musculoskeletal:  Intact, R groin previous sheath dressing c/d Skin:  Warm and dry  LABS:  CBC  Recent Labs Lab 10/26/13 1745 10/27/13 0110 10/28/13 0420  WBC 5.8 4.7 3.8*  HGB 11.6* 10.7* 10.0*  HCT 35.0* 31.5* 30.6*  PLT 135* 116* 148*   Coag's  Recent Labs Lab 10/25/13 1655 10/26/13 0100 10/28/13 0420  APTT 137*  --   --   INR 1.29 1.26 1.18   BMET  Recent Labs Lab 10/25/13 1655 10/26/13 0100 10/28/13 0420  NA 138 139 142  K 3.9 3.8 4.2  CL 100 105 107  CO2 19 18* 24  BUN 13 12 8   CREATININE 0.88 0.83 0.89  GLUCOSE 110* 136* 96    Electrolytes  Recent Labs Lab 10/25/13 1655 10/26/13 0100 10/28/13 0420  CALCIUM 8.6 8.3* 8.7  MG 1.7  --   --   PHOS 3.2  --   --    Sepsis Markers  Recent Labs Lab 10/25/13 1655  LATICACIDVEN 1.6  PROCALCITON <0.10   ABG No results found for this basename: PHART, PCO2ART, PO2ART,  in the last 168 hours Liver Enzymes  Recent Labs Lab 10/25/13 1655  AST 21  ALT 20  ALKPHOS 169*  BILITOT 0.4  ALBUMIN 3.6   Cardiac Enzymes  Recent Labs Lab 10/25/13 1234  PROBNP 2305.0*   Glucose  Recent Labs Lab 10/25/13 2126  GLUCAP 99     ASSESSMENT / PLAN:  PULMONARY Large saddle PE with RV dysfunction on CT s/p catheter directed TPA lysis Hx of prostate cancer PLAN -  Cont Heparin drip per pharmacy  Coumadin initiated 8/3  Follow INR Pt does not have insurance to cover newer anti-coagulation agents.  He will have to apply for assistance & meet eligibility requirements for the program.      CARDIOVASCULAR Echo 8/1 >> dilated RV, PA 66 mm , on 8/2 decreased to 14mm on angio Hx  HTN A:  HTN P:  Hold home antihypertensive (lisinopril), consider restart am 8/5  HEMATOLOGIC A:   Hx of prostate CA 5 years ago , followed by Dr. Risa Grill P:  PSA 1.27 (wnl)   NEUROLOGIC A:   Recent syncope 7/24 with neg ct head Sz DO P:   Continue current antiepileptic meds   TODAY'S SUMMARY: Large saddle embolus s/p catheter lysis with good response.  Hematuria resolved.  Continue heparin gtt and coumadin.  Await therapeutic INR.    Noe Gens, NP-C [facility] Pgr: [phone] or [phone]   10/28/2013  10:25 AM   Attending:  I have seen and examined the patient with nurse practitioner/resident and agree with the note above.   Roselie Awkward, MD [facility] Pager: [phone] Cell: [phone] If no response, call [phone]

## 2013-10-28 NOTE — Progress Notes (Signed)
ANTICOAGULATION CONSULT NOTE - Follow Up Consult  Pharmacy Consult for heparin Indication: pulmonary embolus  Labs:  Recent Labs  10/25/13 1142 10/25/13 1655 10/26/13 0100  10/26/13 1745 10/27/13 0110 10/27/13 0728 10/27/13 1625 10/28/13 0420  HGB 15.4 14.9 13.2  < > 11.6* 10.7*  --   --  10.0*  HCT 45.2 43.5 38.9*  < > 35.0* 31.5*  --   --  30.6*  PLT 168 155 120*  < > 135* 116*  --   --  148*  APTT  --  137*  --   --   --   --   --   --   --   LABPROT  --  16.1* 15.8*  --   --   --   --   --  15.0  INR  --  1.29 1.26  --   --   --   --   --  1.18  HEPARINUNFRC  --   --  0.76*  < > 0.15* 0.36 <0.10* 0.59 0.75*  CREATININE 0.98 0.88 0.83  --   --   --   --   --   --   < > = values in this interval not displayed.   Assessment: 79yo male now supratherapeutic on heparin after one level at goal.  Goal of Therapy:  Heparin level 0.3-0.7 units/ml   Plan:  Will decrease heparin gtt slightly to 1550 units/hr and check level in [place], PharmD, BCPS  10/28/2013,5:58 AM

## 2013-10-28 NOTE — Discharge Instructions (Signed)

## 2013-10-28 NOTE — Progress Notes (Signed)
Subjective: Patient denies SOB/CP or any active bleeding. He states he is feeling much better.   Objective: Physical Exam: BP 151/72  Pulse 88  Temp(Src) 97.8 F (36.6 C) (Oral)  Resp 18  Ht 6' (1.829 m)  Wt 188 lb 7.9 oz (85.5 kg)  BMI 25.56 kg/m2  SpO2 94%  General: A&Ox3, NAD Heart: RRR without M/G/R Lungs: CTA bilaterally. Ext: RCFA access site without hematoma or bleeding. Right femoral and DP intact.  Labs: CBC  Recent Labs  10/27/13 0110 10/28/13 0420  WBC 4.7 3.8*  HGB 10.7* 10.0*  HCT 31.5* 30.6*  PLT 116* 148*   BMET  Recent Labs  10/26/13 0100 10/28/13 0420  NA 139 142  K 3.8 4.2  CL 105 107  CO2 18* 24  GLUCOSE 136* 96  BUN 12 8  CREATININE 0.83 0.89  CALCIUM 8.3* 8.7   LFT  Recent Labs  10/25/13 1655  PROT 7.4  ALBUMIN 3.6  AST 21  ALT 20  ALKPHOS 169*  BILITOT 0.4   PT/INR  Recent Labs  10/26/13 0100 10/28/13 0420  LABPROT 15.8* 15.0  INR 1.26 1.18     Studies/Results: Ct Angio Chest Pe W/cm &/or Wo Cm  10/28/2013   CLINICAL DATA:  Pulmonary embolus lysis.  EXAM: CT ANGIOGRAPHY CHEST WITH CONTRAST  TECHNIQUE: Multidetector CT imaging of the chest was performed using the standard protocol during bolus administration of intravenous contrast. Multiplanar CT image reconstructions and MIPs were obtained to evaluate the vascular anatomy.  CONTRAST:  92mL OMNIPAQUE IOHEXOL 350 MG/ML SOLN  COMPARISON:  CT 10/25/2013.  FINDINGS: Thoracic aorta is atherosclerotic. No aneurysm or dissection. There has been partial clearing of severe bilateral pulmonary artery thrombus. Prominent thrombus remains PICC is in the right main pulmonary artery. Right ventricular to left ventricular ratio has improved to 1.0, down from 2.0 on prior CT . This suggest improving right ventricular function.  Shotty mediastinal lymph nodes be addressed left is unremarkable.  Mild bibasilar atelectasis. No significant pleural effusion. No pneumothorax.  Upper abdominal  this are unremarkable were visualized.  Thyroid is unremarkable. No supraclavicular or axillary adenopathy. No acute bony abnormality.  Review of the MIP images confirms the above findings.  IMPRESSION: 1. Interim partial clearing of severe bilateral pulmonary artery thrombus. Large volume of thrombus particularly in the right main pulmonary artery remains. 2. Right ventricular to left ventricular ratios improved to 1.0, down from 2 point on prior CT. This suggests improving right ventricular function. These results will be called to the ordering clinician or representative by the Radiologist Assistant, and communication documented in the PACS or zVision Dashboard.   Electronically Signed   By: Marcello Moores  Register   On: 10/28/2013 09:46   Ir Jacolyn Reedy F/u Eval Art/ven Final Day (ms)  10/26/2013   CLINICAL DATA:  Pulmonary embolism lysis  EXAM: IR THROMB F/U EVAL ART/VEN FINAL DAY  FLUOROSCOPY TIME:  12 seconds.  MEDICATIONS AND MEDICAL HISTORY: None  ANESTHESIA/SEDATION: None  CONTRAST:  None  PROCEDURE: The procedure, risks, benefits, and alternatives were explained to the patient. Questions regarding the procedure were encouraged and answered. The patient understands and consents to the procedure.  The internal wire from the right pulmonary artery infusion catheter was removed. Pressure measurements in the right pulmonary artery were obtained. The identical procedure was performed for the left pulmonary artery. Right and left mean arterial pressures are 16 and 12 mm of mercury respectively. The catheters and sheaths were removed.  FINDINGS: Initial imaging confirms stable  position of the right and left pulmonary artery catheters.  COMPLICATIONS: None  IMPRESSION: Marked improvement in the pulmonary artery pressure after 12 hr of pulmonary artery embolus lysis. Anti coagulation will be resumed. Repeat CT angiogram of the pulmonary arteries will be performed at the 48 hr interval.   Electronically Signed   By: Maryclare Bean  M.D.   On: 10/26/2013 12:15    Assessment/Plan: Bilateral PE with right heart strain, symptoms improved, no active bleeding.  s/p completion of PE catheter directed lysis 8/2; hep to coumadin transition per CCM F/u CTA chest/PE eval reviewed improving right ventriclar function with partial clearing of thrombus. Patient will need F/U in 1 month with IR clinic.    LOS: 3 days    Rockney Ghee 10/28/2013 10:33 AM

## 2013-10-29 ENCOUNTER — Other Ambulatory Visit: Payer: Self-pay | Admitting: Radiology

## 2013-10-29 DIAGNOSIS — I2699 Other pulmonary embolism without acute cor pulmonale: Secondary | ICD-10-CM

## 2013-10-29 LAB — BASIC METABOLIC PANEL
Anion gap: 13 (ref 5–15)
BUN: 7 mg/dL (ref 6–23)
CO2: 23 mEq/L (ref 19–32)
CREATININE: 0.78 mg/dL (ref 0.50–1.35)
Calcium: 8.5 mg/dL (ref 8.4–10.5)
Chloride: 105 mEq/L (ref 96–112)
GFR, EST NON AFRICAN AMERICAN: 85 mL/min — AB (ref >90)
GLUCOSE: 108 mg/dL — AB (ref 70–99)
POTASSIUM: 3.8 meq/L (ref 3.7–5.3)
Sodium: 141 mEq/L (ref 137–147)

## 2013-10-29 LAB — CBC
HEMATOCRIT: 32.1 % — AB (ref 39.0–52.0)
HEMOGLOBIN: 10.8 g/dL — AB (ref 13.0–17.0)
MCH: 32.6 pg (ref 26.0–34.0)
MCHC: 33.6 g/dL (ref 30.0–36.0)
MCV: 97 fL (ref 78.0–100.0)
Platelets: 155 10*3/uL (ref 150–400)
RBC: 3.31 MIL/uL — ABNORMAL LOW (ref 4.22–5.81)
RDW: 13.5 % (ref 11.5–15.5)
WBC: 2.8 10*3/uL — ABNORMAL LOW (ref 4.0–10.5)

## 2013-10-29 LAB — HEPARIN LEVEL (UNFRACTIONATED): HEPARIN UNFRACTIONATED: 0.45 [IU]/mL (ref 0.30–0.70)

## 2013-10-29 LAB — PROTIME-INR
INR: 1.26 (ref 0.00–1.49)
Prothrombin Time: 15.8 seconds — ABNORMAL HIGH (ref 11.6–15.2)

## 2013-10-29 MED ORDER — WARFARIN SODIUM 7.5 MG PO TABS
7.5000 mg | ORAL_TABLET | Freq: Once | ORAL | Status: AC
  Administered 2013-10-29: 7.5 mg via ORAL
  Filled 2013-10-29: qty 1

## 2013-10-29 MED ORDER — LISINOPRIL 20 MG PO TABS
30.0000 mg | ORAL_TABLET | Freq: Every day | ORAL | Status: DC
  Administered 2013-10-29 – 2013-11-01 (×4): 30 mg via ORAL
  Filled 2013-10-29 (×4): qty 1

## 2013-10-29 NOTE — Progress Notes (Signed)
ANTICOAGULATION CONSULT NOTE - Follow Up Consult  Pharmacy Consult for Heparin  Indication: pulmonary embolus  No Known Allergies  Patient Measurements: Height: 6' (182.9 cm) Weight: 188 lb 7.9 oz (85.5 kg) IBW/kg (Calculated) : 77.6  Vital Signs: Temp: 98.4 F (36.9 C) (08/04 2047) Temp src: Oral (08/04 2047) BP: 137/80 mmHg (08/04 2047) Pulse Rate: 86 (08/04 2047)  Labs:  Recent Labs  10/26/13 0100  10/26/13 1745 10/27/13 0110  10/28/13 0420 10/28/13 1400 10/28/13 2250  HGB 13.2  < > 11.6* 10.7*  --  10.0*  --   --   HCT 38.9*  < > 35.0* 31.5*  --  30.6*  --   --   PLT 120*  < > 135* 116*  --  148*  --   --   LABPROT 15.8*  --   --   --   --  15.0  --   --   INR 1.26  --   --   --   --  1.18  --   --   HEPARINUNFRC 0.76*  < > 0.15* 0.36  < > 0.75* 1.09* 0.40  CREATININE 0.83  --   --   --   --  0.89  --   --   < > = values in this interval not displayed.  Estimated Creatinine Clearance: 76.3 ml/min (by C-G formula based on Cr of 0.89).   Medications:  Heparin 1250 units/hr  Assessment: Heparin for PE. HL is 0.40 after rate decrease, other labs as above.   Goal of Therapy:  Heparin level 0.3-0.7 units/ml Monitor platelets by anticoagulation protocol: Yes   Plan:  -Continue heparin at 1250 units/hr -HL with AM labs  -Daily CBC/HL -Monitor for bleeding  Narda Bonds 10/29/2013,12:11 AM

## 2013-10-29 NOTE — Progress Notes (Addendum)
ANTICOAGULATION CONSULT NOTE  Pharmacy Consult for Heparin / Coumadin Indication: pulmonary embolus  No Known Allergies  Labs:  Recent Labs  10/27/13 0110  10/28/13 0420 10/28/13 1400 10/28/13 2250 10/29/13 0510  HGB 10.7*  --  10.0*  --   --  10.8*  HCT 31.5*  --  30.6*  --   --  32.1*  PLT 116*  --  148*  --   --  155  LABPROT  --   --  15.0  --   --  15.8*  INR  --   --  1.18  --   --  1.26  HEPARINUNFRC 0.36  < > 0.75* 1.09* 0.40 0.45  CREATININE  --   --  0.89  --   --  0.78  < > = values in this interval not displayed.  Estimated Creatinine Clearance: 84.9 ml/min (by C-G formula based on Cr of 0.78).    Assessment: 79 year old male presents to [facility] with shortness of breath and near syncope. D-dimer positive and CTA positive for bilateraly pulmonary emboli and dilated right heart consistent with right heart strain.   Patient taken for EKOS procedure last night and tPA has been continued up until  9 am 8/2  Hematuria now resolved -- Day 3 of 5 of Coumadin / Heparin overlap   Goal of Therapy:  Heparin level 0.3-0.7 units/ml Monitor platelets by anticoagulation protocol: Yes INR = 2 to 3   Plan:  Heparin 1250 units/hr Coumadin 7.5 mg po x 1 dose Daily INR  Thank you Joya San, PharmD Candidate Anette Guarneri, PharmD [phone]  10/29/2013 10:44 AM

## 2013-10-29 NOTE — Progress Notes (Signed)
PULMONARY / CRITICAL CARE MEDICINE   Name: [name] MRN: [id number] DOB: 03/08/1935    ADMISSION DATE:  10/25/2013   REFERRING MD :  EDP  CHIEF COMPLAINT:  SOB  INITIAL PRESENTATION:  79 y/o AAM who represents with sob after being seen 7/24 in ED with syncope. He had CT angio chest with large saddle embolus with CT evidence of RV dysfunction (ratio 2) and PCCM asked to admit.  STUDIES:  8/01 - CT chest >> large saddle pe 8/02 - BLE dopplers >> neg DVT  SIGNIFICANT EVENTS: 8/01 - catheter directed lysis 8/02 - PA pressure 34  SUBJECTIVE:    VITAL SIGNS: Temp:  [98.1 F (36.7 C)-98.4 F (36.9 C)] 98.1 F (36.7 C) (08/05 0538) Pulse Rate:  [79-88] 79 (08/05 0538) Resp:  [18] 18 (08/05 0538) BP: (135-140)/(76-82) 135/82 mmHg (08/05 0538) SpO2:  [94 %-100 %] 100 % (08/05 0538) Weight:  [189 lb 9.5 oz (86 kg)] 189 lb 9.5 oz (86 kg) (08/05 0538)    INTAKE / OUTPUT:  Intake/Output Summary (Last 24 hours) at 10/29/13 0911 Last data filed at 10/28/13 1200  Gross per 24 hour  Intake     62 ml  Output      0 ml  Net     62 ml    PHYSICAL EXAMINATION: General:  wdwd aam NAD in bed  Neuro:  Sleeping but arouses easily, appropriate, MAE  HEENT:  Poor dentation, No JVD/LAN Cardiovascular:  s1s2 RRR Lungs: resps even non labored on RA, CTA Abdomen:  Soft ni. +BS Musculoskeletal:  Intact, R groin previous sheath dressing c/d Skin:  Warm and dry  LABS:  CBC  Recent Labs Lab 10/27/13 0110 10/28/13 0420 10/29/13 0510  WBC 4.7 3.8* 2.8*  HGB 10.7* 10.0* 10.8*  HCT 31.5* 30.6* 32.1*  PLT 116* 148* 155   Coag's  Recent Labs Lab 10/25/13 1655 10/26/13 0100 10/28/13 0420 10/29/13 0510  APTT 137*  --   --   --   INR 1.29 1.26 1.18 1.26   BMET  Recent Labs Lab 10/26/13 0100 10/28/13 0420 10/29/13 0510  NA 139 142 141  K 3.8 4.2 3.8  CL 105 107 105  CO2 18* 24 23  BUN 12 8 7   CREATININE 0.83 0.89 0.78  GLUCOSE 136* 96 108*   Electrolytes  Recent  Labs Lab 10/25/13 1655 10/26/13 0100 10/28/13 0420 10/29/13 0510  CALCIUM 8.6 8.3* 8.7 8.5  MG 1.7  --   --   --   PHOS 3.2  --   --   --    Sepsis Markers  Recent Labs Lab 10/25/13 1655  LATICACIDVEN 1.6  PROCALCITON <0.10   ABG No results found for this basename: PHART, PCO2ART, PO2ART,  in the last 168 hours Liver Enzymes  Recent Labs Lab 10/25/13 1655  AST 21  ALT 20  ALKPHOS 169*  BILITOT 0.4  ALBUMIN 3.6   Cardiac Enzymes  Recent Labs Lab 10/25/13 1234  PROBNP 2305.0*   Glucose  Recent Labs Lab 10/25/13 2126  GLUCAP 99     ASSESSMENT / PLAN:  PULMONARY Large saddle PE with RV dysfunction on CT s/p catheter directed TPA lysis Hx of prostate cancer PLAN -  -Coumadin initiated 8/3 - INR remains subtherapeutic  -Cont Heparin drip per pharmacy  -Follow INR -Pt does not have insurance to cover newer anti-coagulation agents.  He will have to apply for assistance & meet eligibility requirements for the program.   Cont coumadin for now.  CARDIOVASCULAR Echo 8/1 >> dilated RV, PA 66 mm , on 8/2 decreased to 13mm on angio Hx HTN A:  HTN P: -Resume home lisinopril 8/5  HEMATOLOGIC A:   Hx of prostate CA 5 years ago , followed by Dr. Risa Yates - PSA 1.27 (wnl) P:  F/u cbc    NEUROLOGIC A:   Recent syncope 7/24 with neg ct head Sz DO Dementia? P:   Continue current antiepileptic meds   TODAY'S SUMMARY: Large saddle embolus s/p catheter lysis with good response. Hemodynamically stable.  Coumadin initiated 8/3 with heparin bridge. Await therapeutic INR.    Anthony Madrid, NP 10/29/2013  9:11 AM Pager: [phone] or [phone]  *Care during the described time interval was provided by me and/or other providers on the critical care team. I have reviewed this patient's available data, including medical history, events of note, physical examination and test results as part of my evaluation.   Attending:  I have seen and examined  the patient with nurse practitioner/resident and agree with the note above.   Continue warfarin, will try to contact family to ensure he has ability to make it to coumadin clinic visits, etc.  Suspect dementia, we don't feel he is a good candidate for lovenox injections.    Roselie Awkward, MD [facility] Pager: [phone] Cell: [phone] If no response, call [phone]

## 2013-10-30 LAB — CBC
HCT: 32.7 % — ABNORMAL LOW (ref 39.0–52.0)
Hemoglobin: 10.8 g/dL — ABNORMAL LOW (ref 13.0–17.0)
MCH: 31.9 pg (ref 26.0–34.0)
MCHC: 33 g/dL (ref 30.0–36.0)
MCV: 96.5 fL (ref 78.0–100.0)
PLATELETS: 204 10*3/uL (ref 150–400)
RBC: 3.39 MIL/uL — AB (ref 4.22–5.81)
RDW: 13.7 % (ref 11.5–15.5)
WBC: 3.3 10*3/uL — ABNORMAL LOW (ref 4.0–10.5)

## 2013-10-30 LAB — HEPARIN LEVEL (UNFRACTIONATED)
HEPARIN UNFRACTIONATED: 0.18 [IU]/mL — AB (ref 0.30–0.70)
HEPARIN UNFRACTIONATED: 0.83 [IU]/mL — AB (ref 0.30–0.70)
Heparin Unfractionated: 0.37 IU/mL (ref 0.30–0.70)

## 2013-10-30 LAB — PROTIME-INR
INR: 1.42 (ref 0.00–1.49)
Prothrombin Time: 17.4 seconds — ABNORMAL HIGH (ref 11.6–15.2)

## 2013-10-30 MED ORDER — WARFARIN SODIUM 10 MG PO TABS
10.0000 mg | ORAL_TABLET | Freq: Once | ORAL | Status: AC
  Administered 2013-10-30: 10 mg via ORAL
  Filled 2013-10-30: qty 1

## 2013-10-30 MED ORDER — HEPARIN BOLUS VIA INFUSION
2000.0000 [IU] | Freq: Once | INTRAVENOUS | Status: AC
  Administered 2013-10-30: 2000 [IU] via INTRAVENOUS
  Filled 2013-10-30: qty 2000

## 2013-10-30 NOTE — Progress Notes (Signed)
ANTICOAGULATION CONSULT NOTE  Pharmacy Consult for Heparin / Coumadin Indication: pulmonary embolus  No Known Allergies  Labs:  Recent Labs  10/28/13 0420  10/29/13 0510 10/30/13 0519 10/30/13 1342 10/30/13 2124  HGB 10.0*  --  10.8* 10.8*  --   --   HCT 30.6*  --  32.1* 32.7*  --   --   PLT 148*  --  155 204  --   --   LABPROT 15.0  --  15.8* 17.4*  --   --   INR 1.18  --  1.26 1.42  --   --   HEPARINUNFRC 0.75*  < > 0.45 0.18* 0.83* 0.37  CREATININE 0.89  --  0.78  --   --   --   < > = values in this interval not displayed.  Estimated Creatinine Clearance: 84.9 ml/min (by C-G formula based on Cr of 0.78).    Assessment: 79 year old male presents to [facility] with shortness of breath and near syncope. D-dimer positive and CTA positive for bilateraly pulmonary emboli and dilated right heart consistent with right heart strain.   Patient taken for EKOS procedure last night and tPA has been continued up until  9 am 8/2  Hematuria now resolved -- Day 4 of 5 of Coumadin / Heparin overlap  HL therapeutic tonight.    Goal of Therapy:  Heparin level 0.3-0.7 units/ml Monitor platelets by anticoagulation protocol: Yes INR = 2 to 3   Plan:  Cont Heparin 1350 units / hr Heparin level in AM to confirm

## 2013-10-30 NOTE — Progress Notes (Addendum)
ANTICOAGULATION CONSULT NOTE - Follow Up Consult  Pharmacy Consult for heparin / Coumadin Indication: pulmonary embolus  Labs:  Recent Labs  10/28/13 0420  10/28/13 2250 10/29/13 0510 10/30/13 0519  HGB 10.0*  --   --  10.8* 10.8*  HCT 30.6*  --   --  32.1* 32.7*  PLT 148*  --   --  155 204  LABPROT 15.0  --   --  15.8* 17.4*  INR 1.18  --   --  1.26 1.42  HEPARINUNFRC 0.75*  < > 0.40 0.45 0.18*  CREATININE 0.89  --   --  0.78  --   < > = values in this interval not displayed.   Assessment: 79yo male now subtherapeutic on heparin after two levels at goal; last pm heparin bag was changed and there has been some variability in concentration among our house-made heparin bags which likely explains change in level.  Goal of Therapy:  Heparin level 0.3-0.7 units/ml   Plan:  Will rebolus with heparin 2000 units and increase gtt by 3 units/kg/hr to 1500 units/hr and check level in [place], PharmD, BCPS  10/30/2013,6:19 AM   INR trending up, but slowly  Plan: Will increase Coumadin to 10 mg today (day 4 of 5 of needed overlap) Could we change to Lovenox while in hospital  Thank you. Anette Guarneri, PharmD

## 2013-10-30 NOTE — Progress Notes (Signed)
PULMONARY / CRITICAL CARE MEDICINE   Name: [name] MRN: [id number] DOB: [birth year]    ADMISSION DATE:  10/25/2013   REFERRING MD :  EDP  CHIEF COMPLAINT:  SOB  INITIAL PRESENTATION: 79 y/o AAM who represents with sob after being seen 7/24 in ED with syncope. He had CT angio chest with large saddle embolus with CT evidence of RV dysfunction (ratio 2) and PCCM asked to admit.    has a past medical history of Type II or unspecified type diabetes mellitus without mention of complication, not stated as uncontrolled; Impotence of organic origin; Cervicalgia; Pure hypercholesterolemia; Essential hypertension, benign; and Cancer.   has no past surgical history on file.   STUDIES:  8/01 - CT chest >> large saddle pe 8/02 - BLE dopplers >> neg DVT  SIGNIFICANT EVENTS: 8/01 - catheter directed lysis 8/02 - PA pressure 34 8/06 - INR remains subtherapeutic, no acute events  SUBJECTIVE:  Pt denies chest pain, hemoptysis, SOB.  VITAL SIGNS: Temp:  [98.1 F (36.7 C)-98.4 F (36.9 C)] 98.1 F (36.7 C) (08/06 0447) Pulse Rate:  [79-82] 79 (08/06 0447) Resp:  [18-20] 18 (08/06 0447) BP: (129-134)/(80-93) 134/93 mmHg (08/06 0447) SpO2:  [99 %-100 %] 99 % (08/06 0447) Weight:  [188 lb 11.4 oz (85.6 kg)] 188 lb 11.4 oz (85.6 kg) (08/06 0447)    INTAKE / OUTPUT:  Intake/Output Summary (Last 24 hours) at 10/30/13 1034 Last data filed at 10/29/13 1300  Gross per 24 hour  Intake    240 ml  Output      0 ml  Net    240 ml    PHYSICAL EXAMINATION: General:  wdwd aam NAD in bed  Neuro:  Awake, alert, appropriate, MAE  HEENT:  Poor dentation, No JVD/LAN Cardiovascular:  s1s2 RRR Lungs: resps even non labored on RA, CTA Abdomen:  Soft ni. +BS Musculoskeletal:  Intact, R groin previous sheath dressing c/d Skin:  Warm and dry  LABS:  CBC  Recent Labs Lab 10/28/13 0420 10/29/13 0510 10/30/13 0519  WBC 3.8* 2.8* 3.3*  HGB 10.0* 10.8* 10.8*  HCT 30.6* 32.1* 32.7*  PLT 148* 155  204   Coag's  Recent Labs Lab 10/25/13 1655  10/28/13 0420 10/29/13 0510 10/30/13 0519  APTT 137*  --   --   --   --   INR 1.29  < > 1.18 1.26 1.42  < > = values in this interval not displayed.  BMET  Recent Labs Lab 10/26/13 0100 10/28/13 0420 10/29/13 0510  NA 139 142 141  K 3.8 4.2 3.8  CL 105 107 105  CO2 18* 24 23  BUN 12 8 7   CREATININE 0.83 0.89 0.78  GLUCOSE 136* 96 108*     ASSESSMENT / PLAN:  PULMONARY Large saddle PE with RV dysfunction on CT s/p catheter directed TPA lysis Hx of prostate cancer P:  -Coumadin initiated 8/3 - INR remains subtherapeutic  -Cont Heparin drip per pharmacy  -Follow INR -Pt does not have insurance to cover newer anti-coagulation agents.  He will have to apply for assistance & meet eligibility requirements for the program.   Cont coumadin for now.   -He lives with his brother, concern for adherence to medications at home -will need minimum of 6 months anticoagulation if not lifetime due to malignancy history -follow up with PCP  CARDIOVASCULAR Echo 8/1 >> dilated RV, PA 66 mm , on 8/2 decreased to 64mm on angio Hx HTN A:  HTN P: -Continue home  lisinopril   HEMATOLOGIC A:   Hx of prostate CA 5 years ago , followed by Dr. Risa Grill - PSA 1.27 (wnl) P:  F/u cbc   NEUROLOGIC A:   Recent syncope 7/24 with neg ct head Sz DO Dementia? P:   Continue current antiepileptic meds   TODAY'S SUMMARY: Large saddle embolus s/p catheter lysis with good response. Hemodynamically stable.  Coumadin initiated 8/3 with heparin bridge. Await therapeutic INR.   Noe Gens, NP-C [facility] Pgr: [phone] or [phone]  Transfer to [facility] in am 8/7 as of 0700, PCCM will sign off.  Discussed home support with his brother Eddie Dibbles.  He will be able to assist with medications at home.  Given discussion, feel it will be best to send a home RN out to the house to ensure meds are set up appropriately and for education.    Noe Gens NP 10/30/2013  10:34 AM   STAFF MD Seen patient. He denies complaints or bleeding. Move tO [facility] service; if malignancy is not in remission then needs life long anti coagulation. Coumadin preferred due to $ issues   Dr. Brand Males, M.D., [facility].C.P Pulmonary and Critical Care Medicine Staff Physician [facility] Pager: [phone], If no answer or between  15:00h - 7:00h: call [phone]  10/30/2013 11:46 AM

## 2013-10-30 NOTE — Progress Notes (Signed)
ANTICOAGULATION CONSULT NOTE  Pharmacy Consult for Heparin / Coumadin Indication: pulmonary embolus  No Known Allergies  Labs:  Recent Labs  10/28/13 0420  10/29/13 0510 10/30/13 0519 10/30/13 1342  HGB 10.0*  --  10.8* 10.8*  --   HCT 30.6*  --  32.1* 32.7*  --   PLT 148*  --  155 204  --   LABPROT 15.0  --  15.8* 17.4*  --   INR 1.18  --  1.26 1.42  --   HEPARINUNFRC 0.75*  < > 0.45 0.18* 0.83*  CREATININE 0.89  --  0.78  --   --   < > = values in this interval not displayed.  Estimated Creatinine Clearance: 84.9 ml/min (by C-G formula based on Cr of 0.78).    Assessment: 79 year old male presents to [facility] with shortness of breath and near syncope. D-dimer positive and CTA positive for bilateraly pulmonary emboli and dilated right heart consistent with right heart strain.   Patient taken for EKOS procedure last night and tPA has been continued up until  9 am 8/2  Hematuria now resolved -- Day 4 of 5 of Coumadin / Heparin overlap  HL elevated at 0.83   Goal of Therapy:  Heparin level 0.3-0.7 units/ml Monitor platelets by anticoagulation protocol: Yes INR = 2 to 3   Plan:  Heparin to 1350 units / hr Coumadin 10 mg po x 1 dose 6 hr heparin level Follow up AM labs  Thank you Anette Guarneri, PharmD [phone]  10/30/2013 2:45 PM

## 2013-10-31 LAB — CBC
HEMATOCRIT: 32 % — AB (ref 39.0–52.0)
HEMOGLOBIN: 10.6 g/dL — AB (ref 13.0–17.0)
MCH: 31.9 pg (ref 26.0–34.0)
MCHC: 33.1 g/dL (ref 30.0–36.0)
MCV: 96.4 fL (ref 78.0–100.0)
Platelets: 239 10*3/uL (ref 150–400)
RBC: 3.32 MIL/uL — AB (ref 4.22–5.81)
RDW: 13.6 % (ref 11.5–15.5)
WBC: 3.5 10*3/uL — ABNORMAL LOW (ref 4.0–10.5)

## 2013-10-31 LAB — PROTIME-INR
INR: 1.75 — ABNORMAL HIGH (ref 0.00–1.49)
PROTHROMBIN TIME: 20.4 s — AB (ref 11.6–15.2)

## 2013-10-31 LAB — HEPARIN LEVEL (UNFRACTIONATED): HEPARIN UNFRACTIONATED: 0.38 [IU]/mL (ref 0.30–0.70)

## 2013-10-31 MED ORDER — WARFARIN SODIUM 10 MG PO TABS
10.0000 mg | ORAL_TABLET | Freq: Once | ORAL | Status: AC
  Administered 2013-10-31: 10 mg via ORAL
  Filled 2013-10-31: qty 1

## 2013-10-31 NOTE — Progress Notes (Signed)
ANTICOAGULATION CONSULT NOTE - Follow Up Consult  Pharmacy Consult for Heparin and Coumadin Indication: pulmonary embolus  No Known Allergies  Patient Measurements: Height: 6' (182.9 cm) Weight: 187 lb 12.8 oz (85.186 kg) IBW/kg (Calculated) : 77.6 Heparin Dosing Weight: 85kg  Vital Signs: Temp: 98.4 F (36.9 C) (08/07 0449) Temp src: Oral (08/07 0449) BP: 124/62 mmHg (08/07 1050) Pulse Rate: 84 (08/07 0449)  Labs:  Recent Labs  10/29/13 0510 10/30/13 0519 10/30/13 1342 10/30/13 2124 10/31/13 0350  HGB 10.8* 10.8*  --   --  10.6*  HCT 32.1* 32.7*  --   --  32.0*  PLT 155 204  --   --  239  LABPROT 15.8* 17.4*  --   --  20.4*  INR 1.26 1.42  --   --  1.75*  HEPARINUNFRC 0.45 0.18* 0.83* 0.37 0.38  CREATININE 0.78  --   --   --   --     Estimated Creatinine Clearance: 84.9 ml/min (by C-G formula based on Cr of 0.78).   Medications:  Heparin 1350 units/hr  Assessment: 79yom admitted with bilateral PE and received catheter directed tPA on 8/1-8/2. Patient is now on Day 5 of heparin/warfarin overlap for VTE treatment. Heparin level (0.38) remains therapeutic - continue current rate. INR (1.75) is subtherapeutic but continues to trend up appropriately - repeat Coumadin 10mg  tonight. Please note that INR needs to be therapeutic x 24hrs prior to heparin discontinuation.  - H/H and Plts stable - No significant bleeding reported  Goal of Therapy:  INR 2-3 Heparin level 0.3-0.7 units/ml Monitor platelets by anticoagulation protocol: Yes   Plan:  1. Continue heparin drip 1350 units/hr (13.5 ml/hr) 2. Repeat Coumadin 10mg  PO x 1 today 3. Follow-up AM INR, heparin level and CBC   Earleen Newport [phone] 10/31/2013,1:27 PM

## 2013-10-31 NOTE — Progress Notes (Signed)
[facility] PROGRESS NOTE  Inri Sobieski HUD:[id number] DOB: April 05, 1934 DOA: 10/25/2013 PCP: Leonides Sake, MD Interim summary: 79 y/o AAM who represents with sob after being seen 7/24 in ED with syncope. He had CT angio chest with large saddle embolus with CT evidence of RV dysfunction. He was admitted by PCCM and underwent catheter directed lysis and was started on IV heparin and coumadin.   Assessment/Plan: Large saddle PE with RV dysfunction on CT s/p catheter directed TPA lysis On IV heparin and coumadin. INR sub therapeutic.   Seizure disorder: - resume anti epileptic agents.    Prostate cancer: - follow up with Dr Risa Grill as outpatient.   Hypertension: Controlled.   Anemia: - normocytic. Stable.    DVT prophylaxis.    Code Status: full code.  Family Communication: none atbedside.  Disposition Plan: pending PT EVAL.    Consultants:  PCCM.  Procedures:  CTA of the chest.   Antibiotics:  none  HPI/Subjective: Comfortable. Denies any complaints.   Objective: Filed Vitals:   10/31/13 1300  BP: 132/75  Pulse: 79  Temp: 97.6 F (36.4 C)  Resp: 18    Intake/Output Summary (Last 24 hours) at 10/31/13 1740 Last data filed at 10/31/13 1300  Gross per 24 hour  Intake    240 ml  Output      0 ml  Net    240 ml   Filed Weights   10/29/13 0538 10/30/13 0447 10/31/13 0449  Weight: 86 kg (189 lb 9.5 oz) 85.6 kg (188 lb 11.4 oz) 85.186 kg (187 lb 12.8 oz)    Exam:   General:  Alert afebrile comfortable  Cardiovascular: s1s2  Respiratory: ctab  Abdomen: soft NT ND BS+  Musculoskeletal: trace pedal edema.   Data Reviewed: Basic Metabolic Panel:  Recent Labs Lab 10/25/13 1142 10/25/13 1655 10/26/13 0100 10/28/13 0420 10/29/13 0510  NA 135* 138 139 142 141  K 4.0 3.9 3.8 4.2 3.8  CL 101 100 105 107 105  CO2 21 19 18* 24 23  GLUCOSE 148* 110* 136* 96 108*  BUN 16 13 12 8 7   CREATININE 0.98 0.88 0.83 0.89 0.78  CALCIUM 9.2 8.6 8.3* 8.7  8.5  MG  --  1.7  --   --   --   PHOS  --  3.2  --   --   --    Liver Function Tests:  Recent Labs Lab 10/25/13 1655  AST 21  ALT 20  ALKPHOS 169*  BILITOT 0.4  PROT 7.4  ALBUMIN 3.6   No results found for this basename: LIPASE, AMYLASE,  in the last 168 hours No results found for this basename: AMMONIA,  in the last 168 hours CBC:  Recent Labs Lab 10/27/13 0110 10/28/13 0420 10/29/13 0510 10/30/13 0519 10/31/13 0350  WBC 4.7 3.8* 2.8* 3.3* 3.5*  HGB 10.7* 10.0* 10.8* 10.8* 10.6*  HCT 31.5* 30.6* 32.1* 32.7* 32.0*  MCV 94.9 95.6 97.0 96.5 96.4  PLT 116* 148* 155 204 239   Cardiac Enzymes: No results found for this basename: CKTOTAL, CKMB, CKMBINDEX, TROPONINI,  in the last 168 hours BNP (last 3 results)  Recent Labs  10/25/13 1234  PROBNP 2305.0*   CBG:  Recent Labs Lab 10/25/13 2126  GLUCAP 99    Recent Results (from the past 240 hour(s))  MRSA PCR SCREENING     Status: None   Collection Time    10/25/13  7:39 PM      Result Value Ref Range Status  MRSA by PCR NEGATIVE  NEGATIVE Final   Comment:            The GeneXpert MRSA Assay (FDA     approved for NASAL specimens     only), is one component of a     comprehensive MRSA colonization     surveillance program. It is not     intended to diagnose MRSA     infection nor to guide or     monitor treatment for     MRSA infections.     Studies: No results found.  Scheduled Meds: . antiseptic oral rinse  7 mL Mouth Rinse BID  . lisinopril  30 mg Oral Daily  . phenytoin  100 mg Oral Daily  . phenytoin  300 mg Oral QHS  . sodium chloride  3 mL Intravenous Q12H  . Warfarin - Pharmacist Dosing Inpatient   Does not apply q1800   Continuous Infusions: . sodium chloride 50 mL/hr at 10/31/13 1238  . sodium chloride 35 mL/hr at 10/25/13 2130  . sodium chloride 35 mL/hr at 10/25/13 2130  . sodium chloride    . sodium chloride 10 mL/hr (10/26/13 1834)  . heparin 1,350 Units/hr (10/31/13 0610)     Active Problems:   Seizures   PE (pulmonary thromboembolism)   HTN (hypertension)   Diabetes    Time spent: 25 minutes.     [facility] Pager [phone]. If 7PM-7AM, please contact night-coverage at [email or web address], password [facility] 10/31/2013, 5:40 PM  LOS: 6 days

## 2013-11-01 LAB — CBC
HCT: 32.6 % — ABNORMAL LOW (ref 39.0–52.0)
HEMOGLOBIN: 10.8 g/dL — AB (ref 13.0–17.0)
MCH: 32.5 pg (ref 26.0–34.0)
MCHC: 33.1 g/dL (ref 30.0–36.0)
MCV: 98.2 fL (ref 78.0–100.0)
PLATELETS: 252 10*3/uL (ref 150–400)
RBC: 3.32 MIL/uL — AB (ref 4.22–5.81)
RDW: 14 % (ref 11.5–15.5)
WBC: 3.5 10*3/uL — AB (ref 4.0–10.5)

## 2013-11-01 LAB — PROTIME-INR
INR: 2.51 — ABNORMAL HIGH (ref 0.00–1.49)
Prothrombin Time: 27.1 seconds — ABNORMAL HIGH (ref 11.6–15.2)

## 2013-11-01 LAB — HEPARIN LEVEL (UNFRACTIONATED): HEPARIN UNFRACTIONATED: 0.39 [IU]/mL (ref 0.30–0.70)

## 2013-11-01 MED ORDER — WARFARIN SODIUM 2.5 MG PO TABS
2.5000 mg | ORAL_TABLET | Freq: Once | ORAL | Status: DC
  Filled 2013-11-01: qty 1

## 2013-11-01 MED ORDER — WARFARIN SODIUM 2.5 MG PO TABS: 2.5000 mg | ORAL_TABLET | Freq: Once | ORAL | Status: DC

## 2013-11-01 NOTE — Progress Notes (Signed)
PT Cancellation Note  Patient Details Name: [name] MRN: [id number] DOB: 1935-01-20   Cancelled Treatment:    Reason Eval/Treat Not Completed: PT screened, no needs identified, will sign off.  Patient declines need for therapy, states he is back to normal, not getting out of breath.  Did ambulate with patient in room, without evidence of imbalance or dyspnea. DC PT services.   Zenia Resides, [name] 11/01/2013, 11:55 AM

## 2013-11-01 NOTE — Progress Notes (Signed)
PT paged; per Dr. Karleen Hampshire, PT needs to see pt prior to d/c; will await callback.

## 2013-11-01 NOTE — Progress Notes (Signed)
IV and tele monitor d/c; pt given d/c instructions given; pt PCP called to check and see if PCP can manage INR checks; PA on call stated PCP can manage Coumadin; pt verbalized understanding to call PCP Monday morning for INR check; pt d/c home with brother.

## 2013-11-01 NOTE — Progress Notes (Signed)
ANTICOAGULATION CONSULT NOTE  Pharmacy Consult for Heparin / Coumadin Indication: pulmonary embolus  No Known Allergies  Labs:  Recent Labs  10/30/13 0519  10/30/13 2124 10/31/13 0350 11/01/13 0325  HGB 10.8*  --   --  10.6* 10.8*  HCT 32.7*  --   --  32.0* 32.6*  PLT 204  --   --  239 252  LABPROT 17.4*  --   --  20.4* 27.1*  INR 1.42  --   --  1.75* 2.51*  HEPARINUNFRC 0.18*  < > 0.37 0.38 0.39  < > = values in this interval not displayed.  Estimated Creatinine Clearance: 84.9 ml/min (by C-G formula based on Cr of 0.78).    Assessment: 79 year old male presents to [facility] with shortness of breath and near syncope. D-dimer positive and CTA positive for bilateraly pulmonary emboli and dilated right heart consistent with right heart strain.   Patient taken for EKOS procedure and tPA has been continued up until  9 am 8/2  Hematuria now resolved -- Day 6 of 5 of Coumadin / Heparin overlap  HL therapeutic at 0.39 INR now within therapeutic range (increased from 1.75 to 2.51 quickly)  To continue heparin until INR therapeutic x 2   Goal of Therapy:  Heparin level 0.3-0.7 units/ml Monitor platelets by anticoagulation protocol: Yes INR = 2 to 3   Plan:  Continue heparin at 1350 units / hr Coumadin 2.5 mg po x 1 dose tonight Start Coumadin 7.5 mg po daily 8/9 and dc heparin if within therapeutic range Follow up AM labs  Thank you Anette Guarneri, PharmD [phone]  11/01/2013 10:50 AM

## 2013-11-01 NOTE — Discharge Summary (Signed)
Physician Discharge Summary  [name] JXB:[id number] DOB: 10/05/1934 DOA: 10/25/2013  PCP: Leonides Sake, MD  Admit date: 10/25/2013 Discharge date: 11/01/2013  Time spent: 30 minutes  Recommendations for Outpatient Follow-up:  1. Follow up with PCP in oneweek 2. Please check INR in 1 to 2 days.   Discharge Diagnoses:  Active Problems:   Seizures   PE (pulmonary thromboembolism)   HTN (hypertension)   Diabetes   Discharge Condition: improved.   Diet recommendation: low sodium diet   Filed Weights   10/30/13 0447 10/31/13 0449 11/01/13 0439  Weight: 85.6 kg (188 lb 11.4 oz) 85.186 kg (187 lb 12.8 oz) 84.9 kg (187 lb 2.7 oz)    History of present illness:  79 y/o AAM who represents with sob after being seen 7/24 in ED with syncope. He had CT angio chest with large saddle embolus with CT evidence of RV dysfunction. He was admitted by PCCM and underwent catheter directed lysis and was started on IV heparin and coumadin.    Hospital Course:  Large saddle PE with RV dysfunction on CT s/p catheter directed TPA lysis  On IV heparin and coumadin. INR  therapeutic. Stopped the iV Heparin and discharged on coumadin. Recommend checking coumadin in 1 to 2 days.  Seizure disorder:  - resume anti epileptic agents.  Prostate cancer:  - follow up with Dr Risa Grill as outpatient.  Hypertension:  Controlled.  Anemia:  - normocytic. Stable.      Procedures: Echocardiogram CT angio of the chest.   Consultations:  none  Discharge Exam: Filed Vitals:   11/01/13 0439  BP: 128/81  Pulse: 73  Temp: 98.3 F (36.8 C)  Resp: 19    General: alert afebrile comfortable Cardiovascular: s1s2 Respiratory: ctab  Discharge Instructions You were cared for by a hospitalist during your hospital stay. If you have any questions about your discharge medications or the care you received while you were in the hospital after you are discharged, you can call the unit and asked to speak with the  hospitalist on call if the hospitalist that took care of you is not available. Once you are discharged, your primary care physician will handle any further medical issues. Please note that NO REFILLS for any discharge medications will be authorized once you are discharged, as it is imperative that you return to your primary care physician (or establish a relationship with a primary care physician if you do not have one) for your aftercare needs so that they can reassess your need for medications and monitor your lab values.  Discharge Instructions   Diet - low sodium heart healthy    Complete by:  As directed      Discharge instructions    Complete by:  As directed   Follow up with PCP in one week.  Please check INR on Monday and resume coumadin as per the INR.            Medication List         lisinopril 30 MG tablet  Commonly known as:  PRINIVIL,ZESTRIL  Take 30 mg by mouth daily.     phenytoin 100 MG ER capsule  Commonly known as:  DILANTIN  Take 100-300 mg by mouth 2 (two) times daily. 100mg  in the morning and 300mg  at bedtime     pravastatin 40 MG tablet  Commonly known as:  PRAVACHOL  Take 40 mg by mouth at bedtime.     TYLENOL 325 MG tablet  Generic drug:  acetaminophen  Take 650 mg by mouth every 4 (four) hours as needed for pain.     warfarin 2.5 MG tablet  Commonly known as:  COUMADIN  Take 1 tablet (2.5 mg total) by mouth one time only at 6 PM.       No Known Allergies     Follow-up Information   Follow up with [facility], MD. Schedule an appointment as soon as possible for a visit in 1 week.   Specialty:  Family Medicine   Contact information:   Dr. Daiva Eves [address] [place] [address] [phone]        The results of significant diagnostics from this hospitalization (including imaging, microbiology, ancillary and laboratory) are listed below for reference.    Significant Diagnostic Studies: Dg Chest 2 View  10/25/2013    CLINICAL DATA:  Syncope, short of breath  EXAM: CHEST  2 VIEW  COMPARISON:  Prior chest x-ray 01/17/2011; prior chest CT 01/18/2011  FINDINGS: Stable borderline cardiomegaly. Atherosclerotic and tortuous thoracic aorta. Mild central bronchitic changes and chronic increased lung markings in the right perihilar region dating back to October of 2012. No focal airspace consolidation, pleural effusion or pneumothorax. No suspicious pulmonary nodule. No acute osseous abnormality. Degenerative spurring throughout the thoracic spine.  IMPRESSION: Stable chest x-ray without evidence acute cardiopulmonary process.   Electronically Signed   By: Jacqulynn Cadet M.D.   On: 10/25/2013 13:51   Ct Head Wo Contrast  10/17/2013   CLINICAL DATA:  Syncopal episode.  EXAM: CT HEAD WITHOUT CONTRAST  TECHNIQUE: Contiguous axial images were obtained from the base of the skull through the vertex without intravenous contrast.  COMPARISON:  PET-CT from 01/17/2011.  FINDINGS: There is no evidence for acute hemorrhage, hydrocephalus, mass lesion, or abnormal extra-axial fluid collection. No definite CT evidence for acute infarction. Diffuse loss of parenchymal volume is consistent with atrophy. Patchy low attenuation in the deep hemispheric and periventricular white matter is nonspecific, but likely reflects chronic microvascular ischemic demyelination. The visualized paranasal sinuses and mastoid air cells are clear.  IMPRESSION: 1. No acute intracranial abnormality. 2. Atrophy with chronic small vessel white matter ischemic demyelination.   Electronically Signed   By: Misty Stanley M.D.   On: 10/17/2013 20:51   Ct Angio Chest Pe W/cm &/or Wo Cm  10/28/2013   CLINICAL DATA:  Pulmonary embolus lysis.  EXAM: CT ANGIOGRAPHY CHEST WITH CONTRAST  TECHNIQUE: Multidetector CT imaging of the chest was performed using the standard protocol during bolus administration of intravenous contrast. Multiplanar CT image reconstructions and MIPs were  obtained to evaluate the vascular anatomy.  CONTRAST:  23mL OMNIPAQUE IOHEXOL 350 MG/ML SOLN  COMPARISON:  CT 10/25/2013.  FINDINGS: Thoracic aorta is atherosclerotic. No aneurysm or dissection. There has been partial clearing of severe bilateral pulmonary artery thrombus. Prominent thrombus remains PICC is in the right main pulmonary artery. Right ventricular to left ventricular ratio has improved to 1.0, down from 2.0 on prior CT . This suggest improving right ventricular function.  Shotty mediastinal lymph nodes be addressed left is unremarkable.  Mild bibasilar atelectasis. No significant pleural effusion. No pneumothorax.  Upper abdominal this are unremarkable were visualized.  Thyroid is unremarkable. No supraclavicular or axillary adenopathy. No acute bony abnormality.  Review of the MIP images confirms the above findings.  IMPRESSION: 1. Interim partial clearing of severe bilateral pulmonary artery thrombus. Large volume of thrombus particularly in the right main pulmonary artery remains. 2. Right ventricular to left ventricular ratios improved to  1.0, down from 2 point on prior CT. This suggests improving right ventricular function. These results will be called to the ordering clinician or representative by the Radiologist Assistant, and communication documented in the PACS or zVision Dashboard.   Electronically Signed   By: Marcello Moores  Register   On: 10/28/2013 09:46   Ct Angio Chest Pe W/cm &/or Wo Cm  10/25/2013   CLINICAL DATA:  Syncope  EXAM: CT ANGIOGRAPHY CHEST WITH CONTRAST  TECHNIQUE: Multidetector CT imaging of the chest was performed using the standard protocol during bolus administration of intravenous contrast. Multiplanar CT image reconstructions and MIPs were obtained to evaluate the vascular anatomy.  CONTRAST:  56mL OMNIPAQUE IOHEXOL 350 MG/ML SOLN  COMPARISON:  None.  FINDINGS: There is extensive bilateral pulmonary thromboembolism as well as saddle embolus extending across the bifurcation.  Thrombus extends into the main right pulmonary artery and lobar branches. There is thrombus in the main left pulmonary artery extending into the left upper and lower lobe lobar branches. Right ventricle to left ventricle sub valvular diameter ratio is 2.0. The right atrium is dilated. There is reflux of contrast into the hepatic veins and IVC. These findings are compatible with right heart dysfunction.  Small mediastinal nodes.  No obvious aortic dissection.  No pneumothorax.  No pleural effusion.  Dependent atelectasis at the lung bases.  No acute bony deformity.  Review of the MIP images confirms the above findings.  IMPRESSION: Study is positive for pulmonary thromboembolism and right heart strain. Critical Value/emergent results were called by telephone at the time of interpretation on 10/25/2013 at 3:42 pm to Dr. Tanna Furry , who verbally acknowledged these results.   Electronically Signed   By: Maryclare Bean M.D.   On: 10/25/2013 15:42   Ir Angiogram Pulmonary Bilateral Selective  10/26/2013   CLINICAL DATA:  Sub mass of pulmonary thromboembolism. Right heart strain. Dyspnea upon walking 10 feet.  EXAM: IR ULTRASOUND GUIDANCE VASC ACCESS RIGHT; IR INFUSION THROMBOL ARTERIAL INITIAL (MS); BILATERAL PULMONARY ARTERIOGRAPHY  FLUOROSCOPY TIME:  13 min and 6 seconds.  MEDICATIONS AND MEDICAL HISTORY: Versed 1 mg, Fentanyl 50 mcg.  Additional Medications: None.  ANESTHESIA/SEDATION: Moderate sedation time: 48 minutes  CONTRAST:  20 cc Omnipaque 300  PROCEDURE: The procedure, risks, benefits, and alternatives were explained to the patient. Questions regarding the procedure were encouraged and answered. The patient understands and consents to the procedure.  The right groin was prepped with Betadine in a sterile fashion, and a sterile drape was applied covering the operative field. A sterile gown and sterile gloves were used for the procedure.  Under sonographic guidance, a micropuncture needle was inserted into the right  common femoral vein and removed over a 018 wire. This was up sized to a Bentson. A 6 French sheath was inserted. A second sheath was inserted into the right common femoral vein.  A 100 cm David catheter was advanced over a Bentson wire into the main pulmonary artery. Pulmonary artery pressure measurements were obtained. Mean arterial pressure was 34 mm Hg.  The catheter was then advanced over a glidewire into the right pulmonary artery. Contrast was injected for pulmonary ear arteriography. It was removed over a Rosen wire.  The identical procedure was performed with an additional catheter into the left pulmonary artery. The catheter was advanced into the descending branch of the left lower lobe. The catheter was removed over a Rosen wire.  Bilateral 12 cm EKOS infusion catheters were advanced over the Rosen wires.  TPA was  begun at 1 milligram/hour per catheter. The sheaths and catheters were secured in place.  FINDINGS: Right pulmonary arteriography demonstrates occlusive thrombus in the peripheral main right pulmonary artery. The thrombus in the main left pulmonary artery is partially occlusive.  COMPLICATIONS: None  IMPRESSION: Successful institution of bilateral pulmonary artery ultrasonic assisted lysis.   Electronically Signed   By: Maryclare Bean M.D.   On: 10/26/2013 09:01   Ir US Guide Vasc Access Right  10/26/2013   CLINICAL DATA:  Sub mass of pulmonary thromboembolism. Right heart strain. Dyspnea upon walking 10 feet.  EXAM: IR ULTRASOUND GUIDANCE VASC ACCESS RIGHT; IR INFUSION THROMBOL ARTERIAL INITIAL (MS); BILATERAL PULMONARY ARTERIOGRAPHY  FLUOROSCOPY TIME:  13 min and 6 seconds.  MEDICATIONS AND MEDICAL HISTORY: Versed 1 mg, Fentanyl 50 mcg.  Additional Medications: None.  ANESTHESIA/SEDATION: Moderate sedation time: 48 minutes  CONTRAST:  20 cc Omnipaque 300  PROCEDURE: The procedure, risks, benefits, and alternatives were explained to the patient. Questions regarding the procedure were encouraged and  answered. The patient understands and consents to the procedure.  The right groin was prepped with Betadine in a sterile fashion, and a sterile drape was applied covering the operative field. A sterile gown and sterile gloves were used for the procedure.  Under sonographic guidance, a micropuncture needle was inserted into the right common femoral vein and removed over a 018 wire. This was up sized to a Bentson. A 6 French sheath was inserted. A second sheath was inserted into the right common femoral vein.  A 100 cm David catheter was advanced over a Bentson wire into the main pulmonary artery. Pulmonary artery pressure measurements were obtained. Mean arterial pressure was 34 mm Hg.  The catheter was then advanced over a glidewire into the right pulmonary artery. Contrast was injected for pulmonary ear arteriography. It was removed over a Rosen wire.  The identical procedure was performed with an additional catheter into the left pulmonary artery. The catheter was advanced into the descending branch of the left lower lobe. The catheter was removed over a Rosen wire.  Bilateral 12 cm EKOS infusion catheters were advanced over the Rosen wires.  TPA was begun at 1 milligram/hour per catheter. The sheaths and catheters were secured in place.  FINDINGS: Right pulmonary arteriography demonstrates occlusive thrombus in the peripheral main right pulmonary artery. The thrombus in the main left pulmonary artery is partially occlusive.  COMPLICATIONS: None  IMPRESSION: Successful institution of bilateral pulmonary artery ultrasonic assisted lysis.   Electronically Signed   By: Maryclare Bean M.D.   On: 10/26/2013 09:01   Ir Infusion Thrombol Arterial Initial (ms)  10/26/2013   CLINICAL DATA:  Sub mass of pulmonary thromboembolism. Right heart strain. Dyspnea upon walking 10 feet.  EXAM: IR ULTRASOUND GUIDANCE VASC ACCESS RIGHT; IR INFUSION THROMBOL ARTERIAL INITIAL (MS); BILATERAL PULMONARY ARTERIOGRAPHY  FLUOROSCOPY TIME:  13  min and 6 seconds.  MEDICATIONS AND MEDICAL HISTORY: Versed 1 mg, Fentanyl 50 mcg.  Additional Medications: None.  ANESTHESIA/SEDATION: Moderate sedation time: 48 minutes  CONTRAST:  20 cc Omnipaque 300  PROCEDURE: The procedure, risks, benefits, and alternatives were explained to the patient. Questions regarding the procedure were encouraged and answered. The patient understands and consents to the procedure.  The right groin was prepped with Betadine in a sterile fashion, and a sterile drape was applied covering the operative field. A sterile gown and sterile gloves were used for the procedure.  Under sonographic guidance, a micropuncture needle was inserted into the right common  femoral vein and removed over a 018 wire. This was up sized to a Bentson. A 6 French sheath was inserted. A second sheath was inserted into the right common femoral vein.  A 100 cm David catheter was advanced over a Bentson wire into the main pulmonary artery. Pulmonary artery pressure measurements were obtained. Mean arterial pressure was 34 mm Hg.  The catheter was then advanced over a glidewire into the right pulmonary artery. Contrast was injected for pulmonary ear arteriography. It was removed over a Rosen wire.  The identical procedure was performed with an additional catheter into the left pulmonary artery. The catheter was advanced into the descending branch of the left lower lobe. The catheter was removed over a Rosen wire.  Bilateral 12 cm EKOS infusion catheters were advanced over the Rosen wires.  TPA was begun at 1 milligram/hour per catheter. The sheaths and catheters were secured in place.  FINDINGS: Right pulmonary arteriography demonstrates occlusive thrombus in the peripheral main right pulmonary artery. The thrombus in the main left pulmonary artery is partially occlusive.  COMPLICATIONS: None  IMPRESSION: Successful institution of bilateral pulmonary artery ultrasonic assisted lysis.   Electronically Signed   By: Maryclare Bean M.D.   On: 10/26/2013 09:01   Ir Jacolyn Reedy F/u Eval Art/ven Final Day (ms)  10/26/2013   CLINICAL DATA:  Pulmonary embolism lysis  EXAM: IR THROMB F/U EVAL ART/VEN FINAL DAY  FLUOROSCOPY TIME:  12 seconds.  MEDICATIONS AND MEDICAL HISTORY: None  ANESTHESIA/SEDATION: None  CONTRAST:  None  PROCEDURE: The procedure, risks, benefits, and alternatives were explained to the patient. Questions regarding the procedure were encouraged and answered. The patient understands and consents to the procedure.  The internal wire from the right pulmonary artery infusion catheter was removed. Pressure measurements in the right pulmonary artery were obtained. The identical procedure was performed for the left pulmonary artery. Right and left mean arterial pressures are 16 and 12 mm of mercury respectively. The catheters and sheaths were removed.  FINDINGS: Initial imaging confirms stable position of the right and left pulmonary artery catheters.  COMPLICATIONS: None  IMPRESSION: Marked improvement in the pulmonary artery pressure after 12 hr of pulmonary artery embolus lysis. Anti coagulation will be resumed. Repeat CT angiogram of the pulmonary arteries will be performed at the 48 hr interval.   Electronically Signed   By: Maryclare Bean M.D.   On: 10/26/2013 12:15    Microbiology: Recent Results (from the past 240 hour(s))  MRSA PCR SCREENING     Status: None   Collection Time    10/25/13  7:39 PM      Result Value Ref Range Status   MRSA by PCR NEGATIVE  NEGATIVE Final   Comment:            The GeneXpert MRSA Assay (FDA     approved for NASAL specimens     only), is one component of a     comprehensive MRSA colonization     surveillance program. It is not     intended to diagnose MRSA     infection nor to guide or     monitor treatment for     MRSA infections.     Labs: Basic Metabolic Panel:  Recent Labs Lab 10/25/13 1655 10/26/13 0100 10/28/13 0420 10/29/13 0510  NA 138 139 142 141  K 3.9 3.8 4.2  3.8  CL 100 105 107 105  CO2 19 18* 24 23  GLUCOSE 110* 136* 96 108*  BUN  13 12 8 7   CREATININE 0.88 0.83 0.89 0.78  CALCIUM 8.6 8.3* 8.7 8.5  MG 1.7  --   --   --   PHOS 3.2  --   --   --    Liver Function Tests:  Recent Labs Lab 10/25/13 1655  AST 21  ALT 20  ALKPHOS 169*  BILITOT 0.4  PROT 7.4  ALBUMIN 3.6   No results found for this basename: LIPASE, AMYLASE,  in the last 168 hours No results found for this basename: AMMONIA,  in the last 168 hours CBC:  Recent Labs Lab 10/28/13 0420 10/29/13 0510 10/30/13 0519 10/31/13 0350 11/01/13 0325  WBC 3.8* 2.8* 3.3* 3.5* 3.5*  HGB 10.0* 10.8* 10.8* 10.6* 10.8*  HCT 30.6* 32.1* 32.7* 32.0* 32.6*  MCV 95.6 97.0 96.5 96.4 98.2  PLT 148* 155 204 239 252   Cardiac Enzymes: No results found for this basename: CKTOTAL, CKMB, CKMBINDEX, TROPONINI,  in the last 168 hours BNP: BNP (last 3 results)  Recent Labs  10/25/13 1234  PROBNP 2305.0*   CBG:  Recent Labs Lab 10/25/13 2126  GLUCAP 99       Signed:  Ciani Rutten  [facility] 11/01/2013, 1:12 PM

## 2013-11-03 DIAGNOSIS — Z7901 Long term (current) use of anticoagulants: Secondary | ICD-10-CM | POA: Diagnosis not present

## 2013-11-10 DIAGNOSIS — Z7901 Long term (current) use of anticoagulants: Secondary | ICD-10-CM | POA: Diagnosis not present

## 2013-11-10 DIAGNOSIS — I2692 Saddle embolus of pulmonary artery without acute cor pulmonale: Secondary | ICD-10-CM | POA: Diagnosis not present

## 2013-12-12 DIAGNOSIS — D649 Anemia, unspecified: Secondary | ICD-10-CM | POA: Diagnosis not present

## 2013-12-12 DIAGNOSIS — I2692 Saddle embolus of pulmonary artery without acute cor pulmonale: Secondary | ICD-10-CM | POA: Diagnosis not present

## 2014-03-05 DIAGNOSIS — E780 Pure hypercholesterolemia: Secondary | ICD-10-CM | POA: Diagnosis not present

## 2014-03-05 DIAGNOSIS — G40909 Epilepsy, unspecified, not intractable, without status epilepticus: Secondary | ICD-10-CM | POA: Diagnosis not present

## 2014-03-05 DIAGNOSIS — E119 Type 2 diabetes mellitus without complications: Secondary | ICD-10-CM | POA: Diagnosis not present

## 2014-03-05 DIAGNOSIS — Z79899 Other long term (current) drug therapy: Secondary | ICD-10-CM | POA: Diagnosis not present

## 2014-03-05 DIAGNOSIS — I10 Essential (primary) hypertension: Secondary | ICD-10-CM | POA: Diagnosis not present

## 2014-03-05 DIAGNOSIS — I2692 Saddle embolus of pulmonary artery without acute cor pulmonale: Secondary | ICD-10-CM | POA: Diagnosis not present

## 2014-03-24 ENCOUNTER — Encounter (HOSPITAL_COMMUNITY): Payer: Self-pay | Admitting: Emergency Medicine

## 2014-03-24 ENCOUNTER — Inpatient Hospital Stay (HOSPITAL_COMMUNITY)
Admission: EM | Admit: 2014-03-24 | Discharge: 2014-03-26 | DRG: 101 | Disposition: A | Discharge status: Home / Self Care | Payer: Medicare Other | Attending: Internal Medicine | Admitting: Internal Medicine

## 2014-03-24 ENCOUNTER — Emergency Department (HOSPITAL_COMMUNITY): Payer: Medicare Other

## 2014-03-24 DIAGNOSIS — R55 Syncope and collapse: Secondary | ICD-10-CM | POA: Diagnosis present

## 2014-03-24 DIAGNOSIS — I059 Rheumatic mitral valve disease, unspecified: Secondary | ICD-10-CM | POA: Diagnosis not present

## 2014-03-24 DIAGNOSIS — Z8546 Personal history of malignant neoplasm of prostate: Secondary | ICD-10-CM | POA: Diagnosis not present

## 2014-03-24 DIAGNOSIS — E872 Acidosis: Secondary | ICD-10-CM | POA: Diagnosis present

## 2014-03-24 DIAGNOSIS — E785 Hyperlipidemia, unspecified: Secondary | ICD-10-CM | POA: Diagnosis present

## 2014-03-24 DIAGNOSIS — J984 Other disorders of lung: Secondary | ICD-10-CM | POA: Diagnosis not present

## 2014-03-24 DIAGNOSIS — Z86711 Personal history of pulmonary embolism: Secondary | ICD-10-CM

## 2014-03-24 DIAGNOSIS — R569 Unspecified convulsions: Secondary | ICD-10-CM | POA: Diagnosis not present

## 2014-03-24 DIAGNOSIS — R40243 Glasgow coma scale score 3-8: Secondary | ICD-10-CM | POA: Diagnosis not present

## 2014-03-24 DIAGNOSIS — M47812 Spondylosis without myelopathy or radiculopathy, cervical region: Secondary | ICD-10-CM | POA: Diagnosis not present

## 2014-03-24 DIAGNOSIS — Z79899 Other long term (current) drug therapy: Secondary | ICD-10-CM | POA: Diagnosis not present

## 2014-03-24 DIAGNOSIS — Z87891 Personal history of nicotine dependence: Secondary | ICD-10-CM

## 2014-03-24 DIAGNOSIS — G40909 Epilepsy, unspecified, not intractable, without status epilepticus: Principal | ICD-10-CM | POA: Diagnosis present

## 2014-03-24 DIAGNOSIS — I10 Essential (primary) hypertension: Secondary | ICD-10-CM | POA: Diagnosis present

## 2014-03-24 DIAGNOSIS — R6510 Systemic inflammatory response syndrome (SIRS) of non-infectious origin without acute organ dysfunction: Secondary | ICD-10-CM | POA: Diagnosis present

## 2014-03-24 DIAGNOSIS — R4182 Altered mental status, unspecified: Secondary | ICD-10-CM | POA: Diagnosis not present

## 2014-03-24 DIAGNOSIS — E119 Type 2 diabetes mellitus without complications: Secondary | ICD-10-CM | POA: Diagnosis present

## 2014-03-24 DIAGNOSIS — Z7901 Long term (current) use of anticoagulants: Secondary | ICD-10-CM

## 2014-03-24 DIAGNOSIS — S79919A Unspecified injury of unspecified hip, initial encounter: Secondary | ICD-10-CM | POA: Diagnosis not present

## 2014-03-24 DIAGNOSIS — I471 Supraventricular tachycardia: Secondary | ICD-10-CM | POA: Diagnosis present

## 2014-03-24 DIAGNOSIS — R509 Fever, unspecified: Secondary | ICD-10-CM

## 2014-03-24 DIAGNOSIS — G43909 Migraine, unspecified, not intractable, without status migrainosus: Secondary | ICD-10-CM | POA: Diagnosis not present

## 2014-03-24 DIAGNOSIS — E780 Pure hypercholesterolemia: Secondary | ICD-10-CM | POA: Diagnosis present

## 2014-03-24 DIAGNOSIS — R4020 Unspecified coma: Secondary | ICD-10-CM | POA: Diagnosis not present

## 2014-03-24 DIAGNOSIS — R0902 Hypoxemia: Secondary | ICD-10-CM | POA: Diagnosis present

## 2014-03-24 HISTORY — DX: Unspecified convulsions: R56.9

## 2014-03-24 HISTORY — DX: Other pulmonary embolism without acute cor pulmonale: I26.99

## 2014-03-24 LAB — COMPREHENSIVE METABOLIC PANEL
ALBUMIN: 4.1 g/dL (ref 3.5–5.2)
ALT: 19 U/L (ref 0–53)
ANION GAP: 17 — AB (ref 5–15)
AST: 32 U/L (ref 0–37)
Alkaline Phosphatase: 132 U/L — ABNORMAL HIGH (ref 39–117)
BILIRUBIN TOTAL: 0.3 mg/dL (ref 0.3–1.2)
BUN: 14 mg/dL (ref 6–23)
CHLORIDE: 101 meq/L (ref 96–112)
CO2: 16 mmol/L — ABNORMAL LOW (ref 19–32)
Calcium: 9.2 mg/dL (ref 8.4–10.5)
Creatinine, Ser: 1.25 mg/dL (ref 0.50–1.35)
GFR calc Af Amer: 62 mL/min — ABNORMAL LOW (ref >90)
GFR calc non Af Amer: 54 mL/min — ABNORMAL LOW (ref >90)
Glucose, Bld: 226 mg/dL — ABNORMAL HIGH (ref 70–99)
POTASSIUM: 4.3 mmol/L (ref 3.5–5.1)
Sodium: 134 mmol/L — ABNORMAL LOW (ref 135–145)
Total Protein: 8.1 g/dL (ref 6.0–8.3)

## 2014-03-24 LAB — CBC WITH DIFFERENTIAL/PLATELET
BASOS PCT: 0 % (ref 0–1)
Basophils Absolute: 0 10*3/uL (ref 0.0–0.1)
Eosinophils Absolute: 0 10*3/uL (ref 0.0–0.7)
Eosinophils Relative: 1 % (ref 0–5)
HCT: 42.3 % (ref 39.0–52.0)
HEMOGLOBIN: 13.9 g/dL (ref 13.0–17.0)
LYMPHS PCT: 29 % (ref 12–46)
Lymphs Abs: 1.8 10*3/uL (ref 0.7–4.0)
MCH: 32.7 pg (ref 26.0–34.0)
MCHC: 32.9 g/dL (ref 30.0–36.0)
MCV: 99.5 fL (ref 78.0–100.0)
MONOS PCT: 7 % (ref 3–12)
Monocytes Absolute: 0.4 10*3/uL (ref 0.1–1.0)
NEUTROS PCT: 63 % (ref 43–77)
Neutro Abs: 4 10*3/uL (ref 1.7–7.7)
Platelets: 257 10*3/uL (ref 150–400)
RBC: 4.25 MIL/uL (ref 4.22–5.81)
RDW: 12.9 % (ref 11.5–15.5)
WBC: 6.3 10*3/uL (ref 4.0–10.5)

## 2014-03-24 LAB — URINALYSIS, ROUTINE W REFLEX MICROSCOPIC
BILIRUBIN URINE: NEGATIVE
Glucose, UA: 100 mg/dL — AB
Ketones, ur: NEGATIVE mg/dL
Leukocytes, UA: NEGATIVE
Nitrite: NEGATIVE
PROTEIN: 100 mg/dL — AB
Specific Gravity, Urine: 1.019 (ref 1.005–1.030)
Urobilinogen, UA: 0.2 mg/dL (ref 0.0–1.0)
pH: 5 (ref 5.0–8.0)

## 2014-03-24 LAB — RAPID URINE DRUG SCREEN, HOSP PERFORMED
Amphetamines: NOT DETECTED
Barbiturates: NOT DETECTED
Benzodiazepines: NOT DETECTED
COCAINE: NOT DETECTED
OPIATES: NOT DETECTED
TETRAHYDROCANNABINOL: NOT DETECTED

## 2014-03-24 LAB — PHOSPHORUS: Phosphorus: 3.4 mg/dL (ref 2.3–4.6)

## 2014-03-24 LAB — URINE MICROSCOPIC-ADD ON

## 2014-03-24 LAB — TROPONIN I
Troponin I: <0.03 ng/mL (ref <0.031)
Troponin I: <0.03 ng/mL (ref <0.031)

## 2014-03-24 LAB — PHENYTOIN LEVEL, TOTAL: PHENYTOIN LVL: 9.3 ug/mL — AB (ref 10.0–20.0)

## 2014-03-24 LAB — PROTIME-INR
INR: 1.15 (ref 0.00–1.49)
Prothrombin Time: 14.8 seconds (ref 11.6–15.2)

## 2014-03-24 LAB — I-STAT CG4 LACTIC ACID, ED: Lactic Acid, Venous: 1.1 mmol/L (ref 0.5–2.2)

## 2014-03-24 LAB — GLUCOSE, CAPILLARY: Glucose-Capillary: 105 mg/dL — ABNORMAL HIGH (ref 70–99)

## 2014-03-24 LAB — MAGNESIUM: Magnesium: 2 mg/dL (ref 1.5–2.5)

## 2014-03-24 LAB — LACTIC ACID, PLASMA: Lactic Acid, Venous: 11.9 mmol/L — ABNORMAL HIGH (ref 0.5–2.2)

## 2014-03-24 LAB — ETHANOL: Alcohol, Ethyl (B): <5 mg/dL (ref 0–9)

## 2014-03-24 MED ORDER — SODIUM CHLORIDE 0.9 % IV SOLN: INTRAVENOUS | Status: DC

## 2014-03-24 MED ORDER — PHENYTOIN SODIUM EXTENDED 100 MG PO CAPS
100.0000 mg | ORAL_CAPSULE | Freq: Every morning | ORAL | Status: DC
Start: 2014-03-25 — End: 2014-03-26
  Administered 2014-03-25 – 2014-03-26 (×2): 100 mg via ORAL
  Filled 2014-03-24 (×2): qty 1

## 2014-03-24 MED ORDER — SODIUM CHLORIDE 0.9 % IV SOLN
INTRAVENOUS | Status: AC
  Administered 2014-03-24: 21:00:00 via INTRAVENOUS

## 2014-03-24 MED ORDER — SODIUM CHLORIDE 0.9 % IJ SOLN
3.0000 mL | Freq: Two times a day (BID) | INTRAMUSCULAR | Status: DC
  Administered 2014-03-24 – 2014-03-26 (×4): 3 mL via INTRAVENOUS

## 2014-03-24 MED ORDER — VANCOMYCIN HCL IN DEXTROSE 750-5 MG/150ML-% IV SOLN
750.0000 mg | Freq: Two times a day (BID) | INTRAVENOUS | Status: DC
  Administered 2014-03-25 – 2014-03-26 (×3): 750 mg via INTRAVENOUS
  Filled 2014-03-24 (×4): qty 150

## 2014-03-24 MED ORDER — IOHEXOL 350 MG/ML SOLN
100.0000 mL | Freq: Once | INTRAVENOUS | Status: AC | PRN
  Administered 2014-03-24: 100 mL via INTRAVENOUS

## 2014-03-24 MED ORDER — ACETAMINOPHEN 325 MG PO TABS
650.0000 mg | ORAL_TABLET | Freq: Once | ORAL | Status: AC
  Administered 2014-03-24: 650 mg via ORAL
  Filled 2014-03-24: qty 2

## 2014-03-24 MED ORDER — PHENYTOIN SODIUM EXTENDED 100 MG PO CAPS
300.0000 mg | ORAL_CAPSULE | Freq: Every day | ORAL | Status: DC
Start: 2014-03-24 — End: 2014-03-26
  Administered 2014-03-24 – 2014-03-25 (×2): 300 mg via ORAL
  Filled 2014-03-24 (×3): qty 3

## 2014-03-24 MED ORDER — ACETAMINOPHEN 325 MG PO TABS
650.0000 mg | ORAL_TABLET | Freq: Four times a day (QID) | ORAL | Status: DC | PRN
  Administered 2014-03-25: 650 mg via ORAL
  Filled 2014-03-24: qty 2

## 2014-03-24 MED ORDER — EDOXABAN TOSYLATE 60 MG PO TABS
60.0000 mg | ORAL_TABLET | ORAL | Status: DC
  Administered 2014-03-24 – 2014-03-25 (×2): 60 mg via ORAL
  Filled 2014-03-24 (×4): qty 60

## 2014-03-24 MED ORDER — PRAVASTATIN SODIUM 40 MG PO TABS
40.0000 mg | ORAL_TABLET | Freq: Every day | ORAL | Status: DC
  Administered 2014-03-24 – 2014-03-25 (×2): 40 mg via ORAL
  Filled 2014-03-24 (×3): qty 1

## 2014-03-24 MED ORDER — PIPERACILLIN-TAZOBACTAM 3.375 G IVPB
3.3750 g | Freq: Three times a day (TID) | INTRAVENOUS | Status: DC
  Administered 2014-03-24 – 2014-03-26 (×5): 3.375 g via INTRAVENOUS
  Filled 2014-03-24 (×7): qty 50

## 2014-03-24 MED ORDER — NALOXONE HCL 0.4 MG/ML IJ SOLN
0.2000 mg | Freq: Once | INTRAMUSCULAR | Status: AC
  Administered 2014-03-24: 0.2 mg via INTRAVENOUS
  Filled 2014-03-24: qty 1

## 2014-03-24 MED ORDER — SODIUM CHLORIDE 0.9 % IV BOLUS (SEPSIS)
1000.0000 mL | INTRAVENOUS | Status: AC
  Administered 2014-03-24: 1000 mL via INTRAVENOUS

## 2014-03-24 MED ORDER — ACETAMINOPHEN 650 MG RE SUPP: 650.0000 mg | Freq: Four times a day (QID) | RECTAL | Status: DC | PRN

## 2014-03-24 MED ORDER — INSULIN ASPART 100 UNIT/ML ~~LOC~~ SOLN
0.0000 [IU] | Freq: Three times a day (TID) | SUBCUTANEOUS | Status: DC
  Administered 2014-03-25: 2 [IU] via SUBCUTANEOUS
  Administered 2014-03-25: 1 [IU] via SUBCUTANEOUS
  Administered 2014-03-26: 3 [IU] via SUBCUTANEOUS

## 2014-03-24 MED ORDER — PIPERACILLIN-TAZOBACTAM 3.375 G IVPB 30 MIN
3.3750 g | INTRAVENOUS | Status: AC
  Administered 2014-03-24: 3.375 g via INTRAVENOUS
  Filled 2014-03-24: qty 50

## 2014-03-24 MED ORDER — LISINOPRIL 20 MG PO TABS
30.0000 mg | ORAL_TABLET | Freq: Every day | ORAL | Status: DC
  Administered 2014-03-25 – 2014-03-26 (×2): 30 mg via ORAL
  Filled 2014-03-24 (×2): qty 1

## 2014-03-24 MED ORDER — VANCOMYCIN HCL 10 G IV SOLR
1250.0000 mg | INTRAVENOUS | Status: AC
  Administered 2014-03-24: 1250 mg via INTRAVENOUS
  Filled 2014-03-24 (×2): qty 1250
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
## 2014-03-24 NOTE — ED Notes (Signed)
Pt returned from ct scan, states is cold, shivering-- brother at bedside
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
## 2014-03-24 NOTE — ED Notes (Signed)
Pt's brother --Tristian Sickinger -- at bedside-- lives with patient -- states pt was a little bit confused last night, states that pt said he was going to the store to get some medicine for a headache, but did not return.   Hx of PE a "few months" ago per brother
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
## 2014-03-24 NOTE — ED Notes (Signed)
Pt returned from CT.  Pt remains alert and oriented x's 3.  Only c/o headache.

## 2014-03-24 NOTE — ED Notes (Signed)
Pt to CT at this time.

## 2014-03-25 ENCOUNTER — Observation Stay (HOSPITAL_COMMUNITY): Payer: Medicare Other

## 2014-03-25 DIAGNOSIS — G43909 Migraine, unspecified, not intractable, without status migrainosus: Secondary | ICD-10-CM | POA: Diagnosis not present

## 2014-03-25 DIAGNOSIS — Z87891 Personal history of nicotine dependence: Secondary | ICD-10-CM | POA: Diagnosis not present

## 2014-03-25 DIAGNOSIS — I059 Rheumatic mitral valve disease, unspecified: Secondary | ICD-10-CM | POA: Diagnosis not present

## 2014-03-25 DIAGNOSIS — R6510 Systemic inflammatory response syndrome (SIRS) of non-infectious origin without acute organ dysfunction: Secondary | ICD-10-CM | POA: Diagnosis present

## 2014-03-25 DIAGNOSIS — R55 Syncope and collapse: Secondary | ICD-10-CM | POA: Diagnosis not present

## 2014-03-25 DIAGNOSIS — I10 Essential (primary) hypertension: Secondary | ICD-10-CM

## 2014-03-25 DIAGNOSIS — Z79899 Other long term (current) drug therapy: Secondary | ICD-10-CM | POA: Diagnosis not present

## 2014-03-25 DIAGNOSIS — Z8546 Personal history of malignant neoplasm of prostate: Secondary | ICD-10-CM | POA: Diagnosis not present

## 2014-03-25 DIAGNOSIS — E872 Acidosis: Secondary | ICD-10-CM

## 2014-03-25 DIAGNOSIS — I471 Supraventricular tachycardia: Secondary | ICD-10-CM | POA: Diagnosis present

## 2014-03-25 DIAGNOSIS — G40909 Epilepsy, unspecified, not intractable, without status epilepticus: Secondary | ICD-10-CM | POA: Diagnosis present

## 2014-03-25 DIAGNOSIS — R0902 Hypoxemia: Secondary | ICD-10-CM | POA: Diagnosis present

## 2014-03-25 DIAGNOSIS — E785 Hyperlipidemia, unspecified: Secondary | ICD-10-CM | POA: Diagnosis not present

## 2014-03-25 DIAGNOSIS — Z86711 Personal history of pulmonary embolism: Secondary | ICD-10-CM | POA: Diagnosis not present

## 2014-03-25 DIAGNOSIS — R4182 Altered mental status, unspecified: Secondary | ICD-10-CM | POA: Diagnosis present

## 2014-03-25 DIAGNOSIS — R509 Fever, unspecified: Secondary | ICD-10-CM | POA: Diagnosis not present

## 2014-03-25 DIAGNOSIS — E780 Pure hypercholesterolemia: Secondary | ICD-10-CM | POA: Diagnosis present

## 2014-03-25 DIAGNOSIS — R569 Unspecified convulsions: Secondary | ICD-10-CM | POA: Diagnosis not present

## 2014-03-25 DIAGNOSIS — E119 Type 2 diabetes mellitus without complications: Secondary | ICD-10-CM | POA: Diagnosis present

## 2014-03-25 DIAGNOSIS — Z7901 Long term (current) use of anticoagulants: Secondary | ICD-10-CM | POA: Diagnosis not present

## 2014-03-25 LAB — COMPREHENSIVE METABOLIC PANEL
ALT: 18 U/L (ref 0–53)
AST: 25 U/L (ref 0–37)
Albumin: 3.3 g/dL — ABNORMAL LOW (ref 3.5–5.2)
Alkaline Phosphatase: 102 U/L (ref 39–117)
Anion gap: 4 — ABNORMAL LOW (ref 5–15)
BUN: 14 mg/dL (ref 6–23)
CO2: 25 mmol/L (ref 19–32)
Calcium: 8.7 mg/dL (ref 8.4–10.5)
Chloride: 108 mEq/L (ref 96–112)
Creatinine, Ser: 1.18 mg/dL (ref 0.50–1.35)
GFR calc Af Amer: 67 mL/min — ABNORMAL LOW (ref >90)
GFR calc non Af Amer: 58 mL/min — ABNORMAL LOW (ref >90)
Glucose, Bld: 117 mg/dL — ABNORMAL HIGH (ref 70–99)
Potassium: 3.8 mmol/L (ref 3.5–5.1)
Sodium: 137 mmol/L (ref 135–145)
Total Bilirubin: 0.8 mg/dL (ref 0.3–1.2)
Total Protein: 6.6 g/dL (ref 6.0–8.3)

## 2014-03-25 LAB — CBC
HCT: 34.8 % — ABNORMAL LOW (ref 39.0–52.0)
Hemoglobin: 11.5 g/dL — ABNORMAL LOW (ref 13.0–17.0)
MCH: 31.5 pg (ref 26.0–34.0)
MCHC: 33 g/dL (ref 30.0–36.0)
MCV: 95.3 fL (ref 78.0–100.0)
PLATELETS: 194 10*3/uL (ref 150–400)
RBC: 3.65 MIL/uL — AB (ref 4.22–5.81)
RDW: 13.2 % (ref 11.5–15.5)
WBC: 5 10*3/uL (ref 4.0–10.5)

## 2014-03-25 LAB — PROCALCITONIN: Procalcitonin: 0.38 ng/mL

## 2014-03-25 LAB — TROPONIN I: Troponin I: <0.03 ng/mL (ref <0.031)

## 2014-03-25 LAB — LACTIC ACID, PLASMA: Lactic Acid, Venous: 0.8 mmol/L (ref 0.5–2.2)

## 2014-03-25 LAB — GLUCOSE, CAPILLARY
GLUCOSE-CAPILLARY: 151 mg/dL — AB (ref 70–99)
GLUCOSE-CAPILLARY: 126 mg/dL — AB (ref 70–99)
GLUCOSE-CAPILLARY: 113 mg/dL — AB (ref 70–99)
Glucose-Capillary: 114 mg/dL — ABNORMAL HIGH (ref 70–99)

## 2014-03-25 MED ORDER — PHENYTOIN SODIUM EXTENDED 30 MG PO CAPS
60.0000 mg | ORAL_CAPSULE | Freq: Every day | ORAL | Status: DC
  Administered 2014-03-26: 60 mg via ORAL
  Filled 2014-03-25: qty 2
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
## 2014-03-25 NOTE — Progress Notes (Signed)
BP's: L arm lying: 127/71, MAP 83, HR 79 R arm lying: 118/65, MAP 79, HR 79
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
## 2014-03-25 NOTE — Progress Notes (Signed)
UR completed 

## 2014-03-26 ENCOUNTER — Encounter (HOSPITAL_COMMUNITY): Payer: Self-pay | Admitting: Cardiovascular Disease

## 2014-03-26 ENCOUNTER — Encounter (HOSPITAL_COMMUNITY)
Admission: EM | Disposition: A | Discharge status: Home / Self Care | Payer: Self-pay | Source: Home / Self Care | Attending: Internal Medicine

## 2014-03-26 DIAGNOSIS — R55 Syncope and collapse: Secondary | ICD-10-CM

## 2014-03-26 DIAGNOSIS — I059 Rheumatic mitral valve disease, unspecified: Secondary | ICD-10-CM

## 2014-03-26 DIAGNOSIS — Z7901 Long term (current) use of anticoagulants: Secondary | ICD-10-CM

## 2014-03-26 HISTORY — PX: LOOP RECORDER IMPLANT: SHX5477

## 2014-03-26 LAB — BASIC METABOLIC PANEL
Anion gap: 6 (ref 5–15)
BUN: 12 mg/dL (ref 6–23)
CALCIUM: 8.8 mg/dL (ref 8.4–10.5)
CO2: 26 mmol/L (ref 19–32)
Chloride: 105 mEq/L (ref 96–112)
Creatinine, Ser: 1.07 mg/dL (ref 0.50–1.35)
GFR calc Af Amer: 75 mL/min — ABNORMAL LOW (ref >90)
GFR, EST NON AFRICAN AMERICAN: 65 mL/min — AB (ref >90)
Glucose, Bld: 114 mg/dL — ABNORMAL HIGH (ref 70–99)
Potassium: 3.8 mmol/L (ref 3.5–5.1)
Sodium: 137 mmol/L (ref 135–145)

## 2014-03-26 LAB — CBC
HCT: 37.3 % — ABNORMAL LOW (ref 39.0–52.0)
Hemoglobin: 12.2 g/dL — ABNORMAL LOW (ref 13.0–17.0)
MCH: 31.5 pg (ref 26.0–34.0)
MCHC: 32.7 g/dL (ref 30.0–36.0)
MCV: 96.4 fL (ref 78.0–100.0)
PLATELETS: 190 10*3/uL (ref 150–400)
RBC: 3.87 MIL/uL — ABNORMAL LOW (ref 4.22–5.81)
RDW: 13.2 % (ref 11.5–15.5)
WBC: 3.9 10*3/uL — ABNORMAL LOW (ref 4.0–10.5)

## 2014-03-26 LAB — URINE CULTURE
COLONY COUNT: NO GROWTH
CULTURE: NO GROWTH

## 2014-03-26 LAB — PSA: PSA: 1.49 ng/mL (ref <=4.00)

## 2014-03-26 LAB — GLUCOSE, CAPILLARY
GLUCOSE-CAPILLARY: 114 mg/dL — AB (ref 70–99)
Glucose-Capillary: 177 mg/dL — ABNORMAL HIGH (ref 70–99)

## 2014-03-26 SURGERY — LOOP RECORDER IMPLANT
Anesthesia: LOCAL

## 2014-03-26 MED ORDER — EDOXABAN TOSYLATE 60 MG PO TABS: 60.0000 mg | ORAL_TABLET | ORAL | Status: DC

## 2014-03-26 MED ORDER — LIDOCAINE-EPINEPHRINE 1 %-1:100000 IJ SOLN
INTRAMUSCULAR | Status: AC
  Filled 2014-03-26: qty 1

## 2014-03-26 MED ORDER — PHENYTOIN SODIUM EXTENDED 30 MG PO CAPS: 60.0000 mg | ORAL_CAPSULE | Freq: Every morning | ORAL | Status: DC

## 2014-03-26 NOTE — CV Procedure (Signed)
LOOP RECORDER IMPLANT   Procedure report  Procedure performed:  1. Loop recorder implantation  Reason for procedure:  1. Recurrent syncope Procedure performed by:  Sanda Klein, MD  Complications:  None  Estimated blood loss:  <5 mL  Medications administered during procedure:  Lidocaine 1% locally, 15 mL Device details:  Medtronic Reveal Linq model number [id number], serial number [id number] Procedure details:  After the risks and benefits of the procedure were discussed the patient provided informed consent. He was brought to the cardiac catheter lab in the fasting state. The patient was prepped and draped in usual sterile fashion. Local anesthesia with 1% lidocaine was administered to an area 2 cm to the left of the sternum in the 4th intercostal space. A horizontal incision was made using the incision tool. The introducer was then used to create a subcutaneous tunnel and carefully deploy the device. Local pressure was held to ensure hemostasis.  The incision was closed with SteriStrips and a sterile dressing was applied.  R waves 0.45 mV.  Sanda Klein, MD, [place] [phone] office [phone] pager 03/26/2014 1:01 PM

## 2014-03-26 NOTE — Discharge Instructions (Signed)
Supplemental Discharge Instructions for  Pacemaker/Defibrillator Patients  Activity No restrictions from loop recorder point of view  WOUND CARE - Keep the wound area clean and dry.  Remove the dressing the day after you return home (usually 48 hours after the procedure). - DO NOT SUBMERGE UNDER WATER UNTIL FULLY HEALED (no tub baths, hot tubs, swimming pools, etc.).  - You  may shower or take a sponge bath after the dressing is removed. DO NOT SOAK the area and do not allow the shower to directly spray on the site. - If you have staples, these will be removed in the office in 7-14 days. - If you have tape/steri-strips on your wound, these will fall off; do not pull them off prematurely.   - No bandage is needed on the site.  DO  NOT apply any creams, oils, or ointments to the wound area. - If you notice any drainage or discharge from the wound, any swelling, excessive redness or bruising at the site, or if you develop a fever > 101? F after you are discharged home, call the office at once.  Special Instructions - You are still able to use cellular telephones.  Avoid carrying your cellular phone near your device. - When traveling through airports, show security personnel your identification card to avoid being screened in the metal detectors.  - Avoid arc welding equipment, MRI testing (magnetic resonance imaging), TENS units (transcutaneous nerve stimulators).  Call the office for questions about other devices. - Avoid electrical appliances that are in poor condition or are not properly grounded. - Microwave ovens are safe to be near or to operate.      Thank you for allowing [place] to be involved in your healthcare while you were hospitalized at [facility].   Please note that there have been changes to your home medications.  --> PLEASE LOOK AT YOUR DISCHARGE MEDICATION LIST FOR DETAILS.  Please call your PCP if you have any questions or concerns, or any difficulty  getting any of your medications.  Please return to the ER if you have worsening of your symptoms or new severe symptoms arise.  FOR YOUR SEIZURES: -TAKE DILANTIN 160 MG EVERY MORNING (One 100 MG TAB + TWO 30 MG TABS) AND DILANTIN 300 MG IN THE EVENING -TAKE TYLENOL FOR HEADACHES - DO NOT DRIVE FOR 6 MONTHS, DR. HAMRICK CAN DISCUSS THIS FURTHER WITH YOU  CONTINUE YOUR OTHER MEDICATIONS AS PRESCRIBED  FOLLOW UP WITH DR. Bramwell ON 04/01/2014 AND WITH CARDIOLOGY (THEY WILL CONTACT YOU)  Epilepsy Epilepsy is a disorder in which a person has repeated seizures over time. A seizure is a release of abnormal electrical activity in the brain. Seizures can cause a change in attention, behavior, or the ability to remain awake and alert (altered mental status). Seizures often involve uncontrollable shaking (convulsions).  Most people with epilepsy lead normal lives. However, people with epilepsy are at an increased risk of falls, accidents, and injuries. Therefore, it is important to begin treatment right away. CAUSES  Epilepsy has many possible causes. Anything that disturbs the normal pattern of brain cell activity can lead to seizures. This may include:   Head injury.  Birth trauma.  High fever as a child.  Stroke.  Bleeding into or around the brain.  Certain drugs.  Prolonged low oxygen, such as what occurs after CPR efforts.  Abnormal brain development.  Certain illnesses, such as meningitis, encephalitis (brain infection), malaria, and other infections.  An imbalance of nerve  signaling chemicals (neurotransmitters).  SIGNS AND SYMPTOMS  The symptoms of a seizure can vary greatly from one person to another. Right before a seizure, you may have a warning (aura) that a seizure is about to occur. An aura may include the following symptoms:  Fear or anxiety.  Nausea.  Feeling like the room is spinning (vertigo).  Vision changes, such as seeing flashing lights or spots. Common  symptoms during a seizure include:  Abnormal sensations, such as an abnormal smell or a bitter taste in the mouth.   Sudden, general body stiffness.   Convulsions that involve rhythmic jerking of the face, arm, or leg on one or both sides.   Sudden change in consciousness.   Appearing to be awake but not responding.   Appearing to be asleep but cannot be awakened.   Grimacing, chewing, lip smacking, drooling, tongue biting, or loss of bowel or bladder control. After a seizure, you may feel sleepy for a while. DIAGNOSIS  Your health care provider will ask about your symptoms and take a medical history. Descriptions from any witnesses to your seizures will be very helpful in the diagnosis. A physical exam, including a detailed neurological exam, is necessary. Various tests may be done, such as:   An electroencephalogram (EEG). This is a painless test of your brain waves. In this test, a diagram is created of your brain waves. These diagrams can be interpreted by a specialist.  An MRI of the brain.   A CT scan of the brain.   A spinal tap (lumbar puncture, LP).  Blood tests to check for signs of infection or abnormal blood chemistry. TREATMENT  There is no cure for epilepsy, but it is generally treatable. Once epilepsy is diagnosed, it is important to begin treatment as soon as possible. For most people with epilepsy, seizures can be controlled with medicines. The following may also be used:  A pacemaker for the brain (vagus nerve stimulator) can be used for people with seizures that are not well controlled by medicine.  Surgery on the brain. For some people, epilepsy eventually goes away. HOME CARE INSTRUCTIONS   Follow your health care provider's recommendations on driving and safety in normal activities.  Get enough rest. Lack of sleep can cause seizures.  Only take over-the-counter or prescription medicines as directed by your health care provider. Take any  prescribed medicine exactly as directed.  Avoid any known triggers of your seizures.  Keep a seizure diary. Record what you recall about any seizure, especially any possible trigger.   Make sure the people you live and work with know that you are prone to seizures. They should receive instructions on how to help you. In general, a witness to a seizure should:   Cushion your head and body.   Turn you on your side.   Avoid unnecessarily restraining you.   Not place anything inside your mouth.   Call for emergency medical help if there is any question about what has occurred.   Follow up with your health care provider as directed. You may need regular blood tests to monitor the levels of your medicine.  SEEK MEDICAL CARE IF:   You develop signs of infection or other illness. This might increase the risk of a seizure.   You seem to be having more frequent seizures.   Your seizure pattern is changing.  SEEK IMMEDIATE MEDICAL CARE IF:   You have a seizure that does not stop after a few moments.   You  have a seizure that causes any difficulty in breathing.   You have a seizure that results in a very severe headache.   You have a seizure that leaves you with the inability to speak or use a part of your body.  Document Released: 03/13/2005 Document Revised: 01/01/2013 Document Reviewed: 10/23/2012 [facility] Patient Information 2015 [place], [place]. This information is not intended to replace advice given to you by your health care provider. Make sure you discuss any questions you have with your health care provider.  Migraine Headache A migraine headache is an intense, throbbing pain on one or both sides of your head. A migraine can last for 30 minutes to several hours. CAUSES  The exact cause of a migraine headache is not always known. However, a migraine may be caused when nerves in the brain become irritated and release chemicals that cause inflammation. This causes  pain. Certain things may also trigger migraines, such as:  Alcohol.  Smoking.  Stress.  Menstruation.  Aged cheeses.  Foods or drinks that contain nitrates, glutamate, aspartame, or tyramine.  Lack of sleep.  Chocolate.  Caffeine.  Hunger.  Physical exertion.  Fatigue.  Medicines used to treat chest pain (nitroglycerine), birth control pills, estrogen, and some blood pressure medicines. SIGNS AND SYMPTOMS  Pain on one or both sides of your head.  Pulsating or throbbing pain.  Severe pain that prevents daily activities.  Pain that is aggravated by any physical activity.  Nausea, vomiting, or both.  Dizziness.  Pain with exposure to bright lights, loud noises, or activity.  General sensitivity to bright lights, loud noises, or smells. Before you get a migraine, you may get warning signs that a migraine is coming (aura). An aura may include:  Seeing flashing lights.  Seeing bright spots, halos, or zigzag lines.  Having tunnel vision or blurred vision.  Having feelings of numbness or tingling.  Having trouble talking.  Having muscle weakness. DIAGNOSIS  A migraine headache is often diagnosed based on:  Symptoms.  Physical exam.  A CT scan or MRI of your head. These imaging tests cannot diagnose migraines, but they can help rule out other causes of headaches. TREATMENT Medicines may be given for pain and nausea. Medicines can also be given to help prevent recurrent migraines.  HOME CARE INSTRUCTIONS  Only take over-the-counter or prescription medicines for pain or discomfort as directed by your health care provider. The use of long-term narcotics is not recommended.  Lie down in a dark, quiet room when you have a migraine.  Keep a journal to find out what may trigger your migraine headaches. For example, write down:  What you eat and drink.  How much sleep you get.  Any change to your diet or medicines.  Limit alcohol consumption.  Quit  smoking if you smoke.  Get 7-9 hours of sleep, or as recommended by your health care provider.  Limit stress.  Keep lights dim if bright lights bother you and make your migraines worse. SEEK IMMEDIATE MEDICAL CARE IF:   Your migraine becomes severe.  You have a fever.  You have a stiff neck.  You have vision loss.  You have muscular weakness or loss of muscle control.  You start losing your balance or have trouble walking.  You feel faint or pass out.  You have severe symptoms that are different from your first symptoms. MAKE SURE YOU:   Understand these instructions.  Will watch your condition.  Will get help right away if you are  not doing well or get worse. Document Released: 03/13/2005 Document Revised: 07/28/2013 Document Reviewed: 11/18/2012 [facility] Patient Information 2015 [place], [place]. This information is not intended to replace advice given to you by your health care provider. Make sure you discuss any questions you have with your health care provider.

## 2014-03-26 NOTE — Progress Notes (Signed)
  Echocardiogram 2D Echocardiogram has been performed.  Bobbye Charleston 03/26/2014, 9:19 AM
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
## 2014-03-26 NOTE — Op Note (Signed)
LOOP RECORDER IMPLANT   Procedure report  Procedure performed:  1. Loop recorder implantation   Reason for procedure:  1. Recurrent syncope Procedure performed by:  Sanda Klein, MD  Complications:  None  Estimated blood loss:  <5 mL  Medications administered during procedure:  Lidocaine 1% locally, 15 mL Device details:  Medtronic Reveal Linq model number [id number], serial number [id number] Procedure details:  After the risks and benefits of the procedure were discussed the patient provided informed consent. He was brought to the cardiac catheter lab in the fasting state. The patient was prepped and draped in usual sterile fashion. Local anesthesia with 1% lidocaine was administered to an area 2 cm to the left of the sternum in the 4th intercostal space. A horizontal incision was made using the incision tool. The introducer was then used to create a subcutaneous tunnel and carefully deploy the device. Local pressure was held to ensure hemostasis.  The incision was closed with SteriStrips and a sterile dressing was applied.  R waves 0.45 mV.  Sanda Klein, MD, [place] [phone] office [phone] pager 03/26/2014 1:01 PM

## 2014-03-26 NOTE — Progress Notes (Signed)
    Subjective:  No further episodes of syncope or pre syncope  Objective:  Vital Signs in the last 24 hours: Temp:  [98.7 F (37.1 C)-99.1 F (37.3 C)] 98.8 F (37.1 C) (12/31 0657) Pulse Rate:  [71-92] 82 (12/31 0657) Resp:  [16-18] 18 (12/31 0657) BP: (120-132)/(66-82) 128/82 mmHg (12/31 0657) SpO2:  [95 %-100 %] 95 % (12/31 0657)  Intake/Output from previous day:  Intake/Output Summary (Last 24 hours) at 03/26/14 1042 Last data filed at 03/26/14 0941  Gross per 24 hour  Intake    600 ml  Output    750 ml  Net   -150 ml    Physical Exam: General appearance: alert, cooperative and no distress Neck: no carotid bruit and no JVD Lungs: clear to auscultation bilaterally Heart: regular rate and rhythm   Rate: 80  Rhythm: normal sinus rhythm and multifocal PVCs, couplets  Lab Results:  Recent Labs  03/25/14 0810 03/26/14 0535  WBC 5.0 3.9*  HGB 11.5* 12.2*  PLT 194 190    Recent Labs  03/25/14 0810 03/26/14 0535  NA 137 137  K 3.8 3.8  CL 108 105  CO2 25 26  GLUCOSE 117* 114*  BUN 14 12  CREATININE 1.18 1.07    Recent Labs  03/25/14 0225 03/25/14 0810  TROPONINI <0.03 <0.03    Recent Labs  03/24/14 1219  INR 1.15    Imaging: Imaging results have been reviewed  Cardiac Studies:  Assessment/Plan:   Principal Problem:   Syncope Active Problems:   Seizures   Hx pulmonary embolism   HTN (hypertension)   Diabetes   Dyslipidemia   Chronic anticoagulation   PLAN: Will review with MD- echo shows normal LVF. His presenting EKG looks like junctional tachycardia. The pt denies any complaints of palpitations. Neuro has signed off- Dilantin increased. If no obvious cause found he cannot drive for 6 months. He'll need an OP event monitor at discharge which we will arrange.   Kerin Ransom PA-C Beeper [phone] 03/26/2014, 10:42 AM   I have seen and examined the patient along with Kerin Ransom PA-C.  I have reviewed the chart, notes and new  data.  I agree with PA's note.  Key new complaints: no new loss of consciousness Key examination changes: normal cardiac exam  Key new findings / data: echo is normal. ECG shows sinus tachycardia with long PR interval, not junctional tachycardia  PLAN: The event was different from his seizures - in the past these have always happened during sleep. I think an implantable loop recorder makes more sense than just a 30 day event monitor, since his syncope has been so infrequent. This procedure has been fully reviewed with the patient and written informed consent has been obtained.   Sanda Klein, MD, [place] [phone] 03/26/2014, 11:54 AM

## 2014-03-30 LAB — CULTURE, BLOOD (ROUTINE X 2)
CULTURE: NO GROWTH
CULTURE: NO GROWTH

## 2014-04-01 DIAGNOSIS — R55 Syncope and collapse: Secondary | ICD-10-CM | POA: Diagnosis not present

## 2014-04-01 DIAGNOSIS — Z79899 Other long term (current) drug therapy: Secondary | ICD-10-CM | POA: Diagnosis not present

## 2014-04-01 DIAGNOSIS — G40909 Epilepsy, unspecified, not intractable, without status epilepticus: Secondary | ICD-10-CM | POA: Diagnosis not present

## 2014-04-06 ENCOUNTER — Encounter: Payer: Medicare Other | Admitting: Cardiology

## 2014-04-06 ENCOUNTER — Ambulatory Visit: Payer: Medicare Other

## 2014-04-15 ENCOUNTER — Ambulatory Visit (INDEPENDENT_AMBULATORY_CARE_PROVIDER_SITE_OTHER): Payer: Medicare Other | Admitting: *Deleted

## 2014-04-15 DIAGNOSIS — R55 Syncope and collapse: Secondary | ICD-10-CM

## 2014-04-15 NOTE — Progress Notes (Signed)
Wound check in clinic s/p ILR implant. Wound well healed without redness or edema. Pt with 0 tachy episodes; 0 brady episodes; 0 asystole; 0 symptom; 0 AF episodes. Plan to follow up via [facility] QMO and with [facility] on 4-19 @ 11:00am.

## 2014-04-24 ENCOUNTER — Ambulatory Visit (INDEPENDENT_AMBULATORY_CARE_PROVIDER_SITE_OTHER): Payer: Medicare Other | Admitting: *Deleted

## 2014-04-24 DIAGNOSIS — R55 Syncope and collapse: Secondary | ICD-10-CM

## 2014-04-24 LAB — MDC_IDC_ENUM_SESS_TYPE_REMOTE

## 2014-04-28 ENCOUNTER — Encounter: Payer: Self-pay | Admitting: Cardiovascular Disease

## 2014-04-28 NOTE — Progress Notes (Signed)
Loop recorder 

## 2014-05-25 ENCOUNTER — Ambulatory Visit (INDEPENDENT_AMBULATORY_CARE_PROVIDER_SITE_OTHER): Payer: Medicare Other | Admitting: *Deleted

## 2014-05-25 DIAGNOSIS — R55 Syncope and collapse: Secondary | ICD-10-CM

## 2014-05-28 NOTE — Progress Notes (Signed)
Loop recorder 

## 2014-06-12 LAB — MDC_IDC_ENUM_SESS_TYPE_REMOTE

## 2014-06-15 ENCOUNTER — Encounter: Payer: Self-pay | Admitting: Cardiovascular Disease

## 2014-06-24 ENCOUNTER — Ambulatory Visit (INDEPENDENT_AMBULATORY_CARE_PROVIDER_SITE_OTHER): Payer: Medicare Other | Admitting: *Deleted

## 2014-06-24 DIAGNOSIS — R55 Syncope and collapse: Secondary | ICD-10-CM

## 2014-06-25 NOTE — Progress Notes (Signed)
Loop recorder 

## 2014-06-26 ENCOUNTER — Encounter: Payer: Self-pay | Admitting: Cardiovascular Disease

## 2014-07-07 DIAGNOSIS — E780 Pure hypercholesterolemia: Secondary | ICD-10-CM | POA: Diagnosis not present

## 2014-07-07 DIAGNOSIS — Z6827 Body mass index (BMI) 27.0-27.9, adult: Secondary | ICD-10-CM | POA: Diagnosis not present

## 2014-07-07 DIAGNOSIS — G40909 Epilepsy, unspecified, not intractable, without status epilepticus: Secondary | ICD-10-CM | POA: Diagnosis not present

## 2014-07-07 DIAGNOSIS — I2692 Saddle embolus of pulmonary artery without acute cor pulmonale: Secondary | ICD-10-CM | POA: Diagnosis not present

## 2014-07-07 DIAGNOSIS — I10 Essential (primary) hypertension: Secondary | ICD-10-CM | POA: Diagnosis not present

## 2014-07-07 DIAGNOSIS — Z79899 Other long term (current) drug therapy: Secondary | ICD-10-CM | POA: Diagnosis not present

## 2014-07-07 DIAGNOSIS — Z1389 Encounter for screening for other disorder: Secondary | ICD-10-CM | POA: Diagnosis not present

## 2014-07-07 DIAGNOSIS — E119 Type 2 diabetes mellitus without complications: Secondary | ICD-10-CM | POA: Diagnosis not present

## 2014-07-07 DIAGNOSIS — Z9181 History of falling: Secondary | ICD-10-CM | POA: Diagnosis not present

## 2014-07-14 ENCOUNTER — Encounter: Payer: Self-pay | Admitting: Cardiovascular Disease

## 2014-07-14 ENCOUNTER — Ambulatory Visit (INDEPENDENT_AMBULATORY_CARE_PROVIDER_SITE_OTHER): Payer: Medicare Other | Admitting: Cardiovascular Disease

## 2014-07-14 VITALS — BP 162/80 | HR 83 | Ht 72.0 in | Wt 196.5 lb

## 2014-07-14 DIAGNOSIS — R55 Syncope and collapse: Secondary | ICD-10-CM

## 2014-07-14 DIAGNOSIS — Z9889 Other specified postprocedural states: Secondary | ICD-10-CM

## 2014-07-14 HISTORY — DX: Other specified postprocedural states: Z98.890

## 2014-07-14 LAB — MDC_IDC_ENUM_SESS_TYPE_INCLINIC
Date Time Interrogation Session: 20160419140540
MDC IDC SET ZONE DETECTION INTERVAL: 3000 ms
Zone Setting Detection Interval: 2000 ms
Zone Setting Detection Interval: 390 ms

## 2014-07-14 NOTE — Patient Instructions (Signed)
Dr.Croitoru wants you to follow-up in: 1 year. You will receive a reminder letter in the mail two months in advance. If you don't receive a letter, please call our office to schedule the follow-up appointment.

## 2014-07-14 NOTE — Progress Notes (Signed)
Patient ID: [name], male   DOB: October 10, 1934, 80 y.o.   MRN: [id number]     Cardiology Office Note   Date:  07/14/2014   ID:  [name], DOB 08-06-1934, MRN [id number]  PCP:  Leonides Sake, MD  Cardiologist:   Sanda Klein, MD   Chief Complaint  Patient presents with  . Follow-up    90 follow up Linq implant.  No complaints of chest pain, SOB edema, dizziness or syncope.      History of Present Illness: [name] is a 80 y.o. male who presents for follow-up roughly 3 months following an episode of loss of consciousness. He has a history of seizures but the events 3 months ago was quite unusual for him. He was taking phenytoin as usual and his blood level was in the therapeutic range, but close to the lower limits of that range.  Since Loop recorder implantation he has not had any new episodes of syncope or near-syncope. Device interrogation has shown no episodes of bradycardia or tachycardia.  He takes a statin for hyperlipidemia and a direct oral anticoagulants for history of previous pulmonary embolism and an ACE inhibitor for hypertension. Echo performed 03/26/2014 did not show any structural disease or could explain syncope but did show a mildly dilated ascending aorta at 45 mm.    Past Medical History  Diagnosis Date  . Type II or unspecified type diabetes mellitus without mention of complication, not stated as uncontrolled   . Impotence of organic origin   . Cervicalgia   . Pure hypercholesterolemia   . Essential hypertension, benign   . Pulmonary embolism   . Cancer     Prostate  . Seizures   . History of loop recorder 07/14/2014    Past Surgical History  Procedure Laterality Date  . Loop recorder implant N/A 03/26/2014    Procedure: LOOP RECORDER IMPLANT;  Surgeon: Sanda Klein, MD;  Location: [place] CATH [facility];  Service: Cardiovascular;  Laterality: N/A;     Current Outpatient Prescriptions  Medication Sig Dispense Refill  . acetaminophen (TYLENOL) 325 MG tablet  Take 650 mg by mouth every 4 (four) hours as needed for pain.    Marland Kitchen edoxaban (SAVAYSA) 60 MG TABS tablet Take 60 mg by mouth daily. 30 tablet 11  . lisinopril (PRINIVIL,ZESTRIL) 30 MG tablet Take 30 mg by mouth daily.    . phenytoin (DILANTIN) 100 MG ER capsule Take 100-300 mg by mouth 2 (two) times daily. 100mg  in the morning and 300mg  at bedtime    . phenytoin (DILANTIN) 30 MG ER capsule Take 2 capsules (60 mg total) by mouth every morning. 60 capsule 11  . pravastatin (PRAVACHOL) 40 MG tablet Take 40 mg by mouth at bedtime.     No current facility-administered medications for this visit.    Allergies:   Review of patient's allergies indicates no known allergies.    Social History:  The patient  reports that he has quit smoking. He has never used smokeless tobacco. He reports that he does not drink alcohol or use illicit drugs.   Family History:  The patient's family history includes Cancer in his father and mother.    ROS:  Please see the history of present illness.    Otherwise, review of systems positive for none.   All other systems are reviewed and negative.    PHYSICAL EXAM: VS:  BP 162/80 mmHg  Pulse 83  Ht 6' (1.829 m)  Wt 196 lb 8 oz (89.132 kg)  BMI 26.64  kg/m2 , BMI Body mass index is 26.64 kg/(m^2).  General: Alert, oriented x3, no distress Head: no evidence of trauma, PERRL, EOMI, no exophtalmos or lid lag, no myxedema, no xanthelasma; normal ears, nose and oropharynx Neck: normal jugular venous pulsations and no hepatojugular reflux; brisk carotid pulses without delay and no carotid bruits Chest: clear to auscultation, no signs of consolidation by percussion or palpation, normal fremitus, symmetrical and full respiratory excursions. Healthy loop recorder site Cardiovascular: normal position and quality of the apical impulse, regular rhythm, normal first and second heart sounds, no murmurs, rubs or gallops Abdomen: no tenderness or distention, no masses by palpation,  no abnormal pulsatility or arterial bruits, normal bowel sounds, no hepatosplenomegaly Extremities: no clubbing, cyanosis or edema; 2+ radial, ulnar and brachial pulses bilaterally; 2+ right femoral, posterior tibial and dorsalis pedis pulses; 2+ left femoral, posterior tibial and dorsalis pedis pulses; no subclavian or femoral bruits Neurological: grossly nonfocal Psych: euthymic mood, full affect   EKG:  EKG is not ordered today.   Recent Labs: 10/25/2013: Pro B Natriuretic peptide (BNP) 2305.0*; TSH 0.646 03/24/2014: Magnesium 2.0 03/25/2014: ALT 18 03/26/2014: BUN 12; Creatinine 1.07; Hemoglobin 12.2*; Platelets 190; Potassium 3.8; Sodium 137    Lipid Panel    Component Value Date/Time   CHOL 155 01/17/2011 1915   TRIG 63 01/17/2011 1915   HDL 50 01/17/2011 1915   CHOLHDL 3.1 01/17/2011 1915   VLDL 13 01/17/2011 1915   LDLCALC 92 01/17/2011 1915      Wt Readings from Last 3 Encounters:  07/14/14 196 lb 8 oz (89.132 kg)  11/01/13 187 lb 2.7 oz (84.9 kg)  09/10/12 204 lb (92.534 kg)      ASSESSMENT AND PLAN:  [name] has not had syncopal events or any recorded arrhythmia since loop recorder implantation 3 months ago. Reminded him about the use of the patient's symptom active 8 or if he should have near syncope or syncope. It was never entirely clear whether his episode of loss of consciousness could possibly have been a seizure. Continue remote monitoring via the [facility] system   Curent medicines are reviewed at length with the patient today.  The patient does not have concerns regarding medicines.  The following changes have been made:  no change  Labs/ tests ordered today include:  No orders of the defined types were placed in this encounter.    Patient Instructions  Dr.Satcha Storlie wants you to follow-up in: 1 year. You will receive a reminder letter in the mail two months in advance. If you don't receive a letter, please call our office to schedule the follow-up  appointment.     Mikael Spray, MD  07/14/2014 3:37 PM    Sanda Klein, MD, [facility] [phone] office [phone] pager

## 2014-07-17 DIAGNOSIS — Z79899 Other long term (current) drug therapy: Secondary | ICD-10-CM | POA: Diagnosis not present

## 2014-07-20 LAB — MDC_IDC_ENUM_SESS_TYPE_REMOTE

## 2014-07-21 ENCOUNTER — Encounter: Payer: Self-pay | Admitting: Cardiovascular Disease

## 2014-07-24 ENCOUNTER — Ambulatory Visit (INDEPENDENT_AMBULATORY_CARE_PROVIDER_SITE_OTHER): Payer: Medicare Other | Admitting: *Deleted

## 2014-07-24 DIAGNOSIS — Z79899 Other long term (current) drug therapy: Secondary | ICD-10-CM | POA: Diagnosis not present

## 2014-07-24 DIAGNOSIS — R55 Syncope and collapse: Secondary | ICD-10-CM | POA: Diagnosis not present

## 2014-07-27 NOTE — Progress Notes (Signed)
Loop recorder 

## 2014-08-04 ENCOUNTER — Encounter: Payer: Self-pay | Admitting: Cardiovascular Disease

## 2014-08-10 DIAGNOSIS — Z79899 Other long term (current) drug therapy: Secondary | ICD-10-CM | POA: Diagnosis not present

## 2014-08-13 LAB — CUP PACEART REMOTE DEVICE CHECK: Date Time Interrogation Session: 20160519110025

## 2014-08-21 ENCOUNTER — Ambulatory Visit (INDEPENDENT_AMBULATORY_CARE_PROVIDER_SITE_OTHER): Payer: Medicare Other | Admitting: *Deleted

## 2014-08-21 DIAGNOSIS — R55 Syncope and collapse: Secondary | ICD-10-CM | POA: Diagnosis not present

## 2014-09-01 LAB — CUP PACEART REMOTE DEVICE CHECK: MDC IDC SESS DTM: 20160607105653

## 2014-09-01 NOTE — Progress Notes (Signed)
Loop recorder 

## 2014-09-08 ENCOUNTER — Encounter: Payer: Self-pay | Admitting: Cardiovascular Disease

## 2014-09-11 DIAGNOSIS — Z79899 Other long term (current) drug therapy: Secondary | ICD-10-CM | POA: Diagnosis not present

## 2014-09-22 ENCOUNTER — Ambulatory Visit (INDEPENDENT_AMBULATORY_CARE_PROVIDER_SITE_OTHER): Payer: Medicare Other | Admitting: *Deleted

## 2014-09-22 DIAGNOSIS — R55 Syncope and collapse: Secondary | ICD-10-CM

## 2014-09-23 LAB — CUP PACEART REMOTE DEVICE CHECK: Date Time Interrogation Session: 20160629135305

## 2014-09-23 NOTE — Progress Notes (Signed)
Loop recorder 

## 2014-10-06 ENCOUNTER — Encounter: Payer: Self-pay | Admitting: Cardiovascular Disease

## 2014-10-22 ENCOUNTER — Ambulatory Visit (INDEPENDENT_AMBULATORY_CARE_PROVIDER_SITE_OTHER): Payer: Medicare Other | Admitting: *Deleted

## 2014-10-22 DIAGNOSIS — R55 Syncope and collapse: Secondary | ICD-10-CM | POA: Diagnosis not present

## 2014-11-02 NOTE — Progress Notes (Signed)
Loop recorder 

## 2014-11-06 LAB — CUP PACEART REMOTE DEVICE CHECK: MDC IDC SESS DTM: 20160812162810

## 2014-11-12 ENCOUNTER — Encounter: Payer: Self-pay | Admitting: Cardiovascular Disease

## 2014-11-13 DIAGNOSIS — G40909 Epilepsy, unspecified, not intractable, without status epilepticus: Secondary | ICD-10-CM | POA: Diagnosis not present

## 2014-11-13 DIAGNOSIS — E119 Type 2 diabetes mellitus without complications: Secondary | ICD-10-CM | POA: Diagnosis not present

## 2014-11-13 DIAGNOSIS — E780 Pure hypercholesterolemia: Secondary | ICD-10-CM | POA: Diagnosis not present

## 2014-11-13 DIAGNOSIS — Z1389 Encounter for screening for other disorder: Secondary | ICD-10-CM | POA: Diagnosis not present

## 2014-11-13 DIAGNOSIS — Z79899 Other long term (current) drug therapy: Secondary | ICD-10-CM | POA: Diagnosis not present

## 2014-11-13 DIAGNOSIS — I2692 Saddle embolus of pulmonary artery without acute cor pulmonale: Secondary | ICD-10-CM | POA: Diagnosis not present

## 2014-11-13 DIAGNOSIS — Z6826 Body mass index (BMI) 26.0-26.9, adult: Secondary | ICD-10-CM | POA: Diagnosis not present

## 2014-11-13 DIAGNOSIS — Z9181 History of falling: Secondary | ICD-10-CM | POA: Diagnosis not present

## 2014-11-13 DIAGNOSIS — I10 Essential (primary) hypertension: Secondary | ICD-10-CM | POA: Diagnosis not present

## 2014-11-20 ENCOUNTER — Ambulatory Visit (INDEPENDENT_AMBULATORY_CARE_PROVIDER_SITE_OTHER): Payer: Medicare Other | Admitting: *Deleted

## 2014-11-20 DIAGNOSIS — R55 Syncope and collapse: Secondary | ICD-10-CM | POA: Diagnosis not present

## 2014-11-25 NOTE — Progress Notes (Signed)
Loop recorder 

## 2014-12-04 LAB — CUP PACEART REMOTE DEVICE CHECK: Date Time Interrogation Session: 20160909115726

## 2014-12-04 NOTE — Progress Notes (Signed)
[facility] summary report received. Battery status OK. Normal device function. No new symptom episodes, tachy episodes, brady, or pause episodes. No new AF episodes. Monthly summary reports and ROV with [place] on 06/2015.

## 2014-12-07 ENCOUNTER — Encounter: Payer: Self-pay | Admitting: Cardiovascular Disease

## 2014-12-21 ENCOUNTER — Ambulatory Visit (INDEPENDENT_AMBULATORY_CARE_PROVIDER_SITE_OTHER): Payer: Medicare Other | Admitting: *Deleted

## 2014-12-21 DIAGNOSIS — R55 Syncope and collapse: Secondary | ICD-10-CM | POA: Diagnosis not present

## 2014-12-24 NOTE — Progress Notes (Signed)
Loop recorder 

## 2014-12-28 ENCOUNTER — Encounter: Payer: Self-pay | Admitting: Cardiovascular Disease

## 2014-12-31 LAB — CUP PACEART REMOTE DEVICE CHECK: MDC IDC SESS DTM: 20160926210613

## 2014-12-31 NOTE — Progress Notes (Signed)
[facility] summary report received. Battery status OK. Normal device function. No new symptom episodes, tachy episodes, brady, or pause episodes. No new AF episodes. Monthly summary reports and ROV with [place] in 06/2015.

## 2015-01-20 ENCOUNTER — Ambulatory Visit (INDEPENDENT_AMBULATORY_CARE_PROVIDER_SITE_OTHER): Payer: Medicare Other | Admitting: *Deleted

## 2015-01-20 DIAGNOSIS — R55 Syncope and collapse: Secondary | ICD-10-CM

## 2015-01-21 NOTE — Progress Notes (Signed)
Loop recorder 

## 2015-02-02 ENCOUNTER — Encounter: Payer: Self-pay | Admitting: Cardiovascular Disease

## 2015-02-19 LAB — CUP PACEART REMOTE DEVICE CHECK: Date Time Interrogation Session: 20161026213520

## 2015-02-19 NOTE — Progress Notes (Signed)
[facility] summary report received. Battery status OK. Normal device function. No new symptom episodes, tachy episodes, brady, or pause episodes. No new AF episodes. Monthly summary reports and ROV with [facility] in 06/2015. 

## 2015-02-22 ENCOUNTER — Ambulatory Visit (INDEPENDENT_AMBULATORY_CARE_PROVIDER_SITE_OTHER): Payer: Medicare Other | Admitting: *Deleted

## 2015-02-22 DIAGNOSIS — R55 Syncope and collapse: Secondary | ICD-10-CM | POA: Diagnosis not present

## 2015-02-24 NOTE — Progress Notes (Signed)
LOOP RECORDER  

## 2015-03-10 DIAGNOSIS — J069 Acute upper respiratory infection, unspecified: Secondary | ICD-10-CM | POA: Diagnosis not present

## 2015-03-10 DIAGNOSIS — G40909 Epilepsy, unspecified, not intractable, without status epilepticus: Secondary | ICD-10-CM | POA: Diagnosis not present

## 2015-03-10 DIAGNOSIS — E663 Overweight: Secondary | ICD-10-CM | POA: Diagnosis not present

## 2015-03-10 DIAGNOSIS — E119 Type 2 diabetes mellitus without complications: Secondary | ICD-10-CM | POA: Diagnosis not present

## 2015-03-10 DIAGNOSIS — Z79899 Other long term (current) drug therapy: Secondary | ICD-10-CM | POA: Diagnosis not present

## 2015-03-10 DIAGNOSIS — E7800 Pure hypercholesterolemia, unspecified: Secondary | ICD-10-CM | POA: Diagnosis not present

## 2015-03-10 DIAGNOSIS — I10 Essential (primary) hypertension: Secondary | ICD-10-CM | POA: Diagnosis not present

## 2015-03-10 DIAGNOSIS — Z6827 Body mass index (BMI) 27.0-27.9, adult: Secondary | ICD-10-CM | POA: Diagnosis not present

## 2015-03-10 DIAGNOSIS — I2692 Saddle embolus of pulmonary artery without acute cor pulmonale: Secondary | ICD-10-CM | POA: Diagnosis not present

## 2015-03-12 DIAGNOSIS — R7889 Finding of other specified substances, not normally found in blood: Secondary | ICD-10-CM | POA: Diagnosis not present

## 2015-03-23 ENCOUNTER — Ambulatory Visit (INDEPENDENT_AMBULATORY_CARE_PROVIDER_SITE_OTHER): Payer: Medicare Other | Admitting: *Deleted

## 2015-03-23 DIAGNOSIS — R55 Syncope and collapse: Secondary | ICD-10-CM

## 2015-03-23 NOTE — Progress Notes (Signed)
[facility] Summary Report / Loop Recorder 

## 2015-04-05 LAB — CUP PACEART REMOTE DEVICE CHECK: MDC IDC SESS DTM: 20161125213713

## 2015-04-09 ENCOUNTER — Encounter: Payer: Self-pay | Admitting: Cardiovascular Disease

## 2015-04-20 ENCOUNTER — Ambulatory Visit (INDEPENDENT_AMBULATORY_CARE_PROVIDER_SITE_OTHER): Payer: Medicare Other | Admitting: *Deleted

## 2015-04-20 DIAGNOSIS — R55 Syncope and collapse: Secondary | ICD-10-CM | POA: Diagnosis not present

## 2015-04-21 NOTE — Progress Notes (Signed)
[facility] Summary Report / Loop Recorder 

## 2015-05-01 LAB — CUP PACEART REMOTE DEVICE CHECK: MDC IDC SESS DTM: 20161225213602

## 2015-05-16 ENCOUNTER — Encounter (HOSPITAL_COMMUNITY): Payer: Self-pay | Admitting: Emergency Medicine

## 2015-05-16 ENCOUNTER — Observation Stay (HOSPITAL_COMMUNITY)
Admission: EM | Admit: 2015-05-16 | Discharge: 2015-05-18 | Disposition: A | Discharge status: Home / Self Care | Payer: Medicare Other | Attending: Student in an Organized Health Care Education/Training Program | Admitting: Student in an Organized Health Care Education/Training Program

## 2015-05-16 ENCOUNTER — Emergency Department (HOSPITAL_COMMUNITY): Payer: Medicare Other

## 2015-05-16 DIAGNOSIS — Z79899 Other long term (current) drug therapy: Secondary | ICD-10-CM | POA: Insufficient documentation

## 2015-05-16 DIAGNOSIS — G40409 Other generalized epilepsy and epileptic syndromes, not intractable, without status epilepticus: Secondary | ICD-10-CM | POA: Diagnosis not present

## 2015-05-16 DIAGNOSIS — N390 Urinary tract infection, site not specified: Secondary | ICD-10-CM | POA: Insufficient documentation

## 2015-05-16 DIAGNOSIS — Z7901 Long term (current) use of anticoagulants: Secondary | ICD-10-CM | POA: Insufficient documentation

## 2015-05-16 DIAGNOSIS — R509 Fever, unspecified: Secondary | ICD-10-CM | POA: Insufficient documentation

## 2015-05-16 DIAGNOSIS — R4182 Altered mental status, unspecified: Secondary | ICD-10-CM | POA: Diagnosis present

## 2015-05-16 DIAGNOSIS — Z923 Personal history of irradiation: Secondary | ICD-10-CM | POA: Insufficient documentation

## 2015-05-16 DIAGNOSIS — C61 Malignant neoplasm of prostate: Secondary | ICD-10-CM | POA: Diagnosis not present

## 2015-05-16 DIAGNOSIS — Z86711 Personal history of pulmonary embolism: Secondary | ICD-10-CM | POA: Insufficient documentation

## 2015-05-16 DIAGNOSIS — I10 Essential (primary) hypertension: Secondary | ICD-10-CM | POA: Diagnosis not present

## 2015-05-16 DIAGNOSIS — E119 Type 2 diabetes mellitus without complications: Secondary | ICD-10-CM | POA: Diagnosis not present

## 2015-05-16 DIAGNOSIS — Z87891 Personal history of nicotine dependence: Secondary | ICD-10-CM | POA: Diagnosis not present

## 2015-05-16 DIAGNOSIS — R569 Unspecified convulsions: Secondary | ICD-10-CM

## 2015-05-16 DIAGNOSIS — R51 Headache: Secondary | ICD-10-CM | POA: Diagnosis not present

## 2015-05-16 DIAGNOSIS — Z9889 Other specified postprocedural states: Secondary | ICD-10-CM

## 2015-05-16 DIAGNOSIS — G9340 Encephalopathy, unspecified: Principal | ICD-10-CM | POA: Insufficient documentation

## 2015-05-16 DIAGNOSIS — E7800 Pure hypercholesterolemia, unspecified: Secondary | ICD-10-CM | POA: Diagnosis not present

## 2015-05-16 LAB — URINALYSIS, ROUTINE W REFLEX MICROSCOPIC
BILIRUBIN URINE: NEGATIVE
Glucose, UA: NEGATIVE mg/dL
HGB URINE DIPSTICK: NEGATIVE
Ketones, ur: NEGATIVE mg/dL
Leukocytes, UA: NEGATIVE
Nitrite: NEGATIVE
PH: 8 (ref 5.0–8.0)
Protein, ur: 30 mg/dL — AB
SPECIFIC GRAVITY, URINE: 1.015 (ref 1.005–1.030)

## 2015-05-16 LAB — COMPREHENSIVE METABOLIC PANEL
ALT: 15 U/L — AB (ref 17–63)
AST: 28 U/L (ref 15–41)
Albumin: 4.1 g/dL (ref 3.5–5.0)
Alkaline Phosphatase: 123 U/L (ref 38–126)
Anion gap: 14 (ref 5–15)
BUN: 7 mg/dL (ref 6–20)
CALCIUM: 9.3 mg/dL (ref 8.9–10.3)
CO2: 22 mmol/L (ref 22–32)
CREATININE: 0.9 mg/dL (ref 0.61–1.24)
Chloride: 98 mmol/L — ABNORMAL LOW (ref 101–111)
GFR calc Af Amer: >60 mL/min (ref >60)
Glucose, Bld: 151 mg/dL — ABNORMAL HIGH (ref 65–99)
Potassium: 4.1 mmol/L (ref 3.5–5.1)
Sodium: 134 mmol/L — ABNORMAL LOW (ref 135–145)
Total Bilirubin: 0.3 mg/dL (ref 0.3–1.2)
Total Protein: 7.6 g/dL (ref 6.5–8.1)

## 2015-05-16 LAB — URINE MICROSCOPIC-ADD ON

## 2015-05-16 LAB — CBC WITH DIFFERENTIAL/PLATELET
Basophils Absolute: 0 10*3/uL (ref 0.0–0.1)
Basophils Relative: 0 %
Eosinophils Absolute: 0 10*3/uL (ref 0.0–0.7)
Eosinophils Relative: 0 %
HEMATOCRIT: 42.7 % (ref 39.0–52.0)
HEMOGLOBIN: 13.9 g/dL (ref 13.0–17.0)
LYMPHS PCT: 14 %
Lymphs Abs: 0.8 10*3/uL (ref 0.7–4.0)
MCH: 31.5 pg (ref 26.0–34.0)
MCHC: 32.6 g/dL (ref 30.0–36.0)
MCV: 96.8 fL (ref 78.0–100.0)
MONO ABS: 0.5 10*3/uL (ref 0.1–1.0)
Monocytes Relative: 8 %
NEUTROS ABS: 4.7 10*3/uL (ref 1.7–7.7)
Neutrophils Relative %: 78 %
Platelets: 224 10*3/uL (ref 150–400)
RBC: 4.41 MIL/uL (ref 4.22–5.81)
RDW: 12.7 % (ref 11.5–15.5)
WBC: 6 10*3/uL (ref 4.0–10.5)

## 2015-05-16 LAB — CBG MONITORING, ED: GLUCOSE-CAPILLARY: 163 mg/dL — AB (ref 65–99)

## 2015-05-16 LAB — CK: Total CK: 167 U/L (ref 49–397)

## 2015-05-16 LAB — TSH: TSH: 0.46 u[IU]/mL (ref 0.350–4.500)

## 2015-05-16 LAB — PHENYTOIN LEVEL, TOTAL: Phenytoin Lvl: 16.9 ug/mL (ref 10.0–20.0)

## 2015-05-16 LAB — SEDIMENTATION RATE: SED RATE: 4 mm/h (ref 0–16)

## 2015-05-16 LAB — I-STAT CG4 LACTIC ACID, ED: Lactic Acid, Venous: 3.47 mmol/L (ref 0.5–2.0)

## 2015-05-16 LAB — LACTIC ACID, PLASMA: Lactic Acid, Venous: 1.7 mmol/L (ref 0.5–2.0)

## 2015-05-16 LAB — PROCALCITONIN

## 2015-05-16 MED ORDER — ACETAMINOPHEN 325 MG PO TABS
650.0000 mg | ORAL_TABLET | Freq: Once | ORAL | Status: AC
  Administered 2015-05-16: 650 mg via ORAL
  Filled 2015-05-16: qty 2

## 2015-05-16 MED ORDER — ACETAMINOPHEN 650 MG RE SUPP: 650.0000 mg | Freq: Four times a day (QID) | RECTAL | Status: DC | PRN

## 2015-05-16 MED ORDER — PHENYTOIN SODIUM EXTENDED 100 MG PO CAPS: 100.0000 mg | ORAL_CAPSULE | Freq: Every day | ORAL | Status: DC

## 2015-05-16 MED ORDER — PHENYTOIN SODIUM EXTENDED 30 MG PO CAPS
160.0000 mg | ORAL_CAPSULE | Freq: Every day | ORAL | Status: DC
  Administered 2015-05-17 – 2015-05-18 (×2): 160 mg via ORAL
  Filled 2015-05-16 (×2): qty 2

## 2015-05-16 MED ORDER — DEXTROSE 5 % IV SOLN
1.0000 g | Freq: Once | INTRAVENOUS | Status: AC
  Administered 2015-05-16: 1 g via INTRAVENOUS
  Filled 2015-05-16: qty 10

## 2015-05-16 MED ORDER — PHENYTOIN SODIUM EXTENDED 100 MG PO CAPS
300.0000 mg | ORAL_CAPSULE | Freq: Every day | ORAL | Status: DC
  Administered 2015-05-16 – 2015-05-17 (×2): 300 mg via ORAL
  Filled 2015-05-16 (×2): qty 3

## 2015-05-16 MED ORDER — SODIUM CHLORIDE 0.9 % IV BOLUS (SEPSIS)
1000.0000 mL | Freq: Once | INTRAVENOUS | Status: AC
  Administered 2015-05-16: 1000 mL via INTRAVENOUS

## 2015-05-16 MED ORDER — IBUPROFEN 800 MG PO TABS
800.0000 mg | ORAL_TABLET | Freq: Once | ORAL | Status: AC
  Administered 2015-05-16: 800 mg via ORAL
  Filled 2015-05-16: qty 1

## 2015-05-16 MED ORDER — EDOXABAN TOSYLATE 60 MG PO TABS
60.0000 mg | ORAL_TABLET | Freq: Every day | ORAL | Status: DC
  Administered 2015-05-16 – 2015-05-17 (×2): 60 mg via ORAL
  Filled 2015-05-16 (×2): qty 60

## 2015-05-16 MED ORDER — ACETAMINOPHEN 325 MG PO TABS
650.0000 mg | ORAL_TABLET | Freq: Four times a day (QID) | ORAL | Status: DC | PRN
  Administered 2015-05-16 – 2015-05-18 (×2): 650 mg via ORAL
  Filled 2015-05-16 (×2): qty 2

## 2015-05-16 MED ORDER — SODIUM CHLORIDE 0.9 % IV SOLN
INTRAVENOUS | Status: AC
  Administered 2015-05-16: 22:00:00 via INTRAVENOUS

## 2015-05-16 NOTE — ED Provider Notes (Signed)
CSN: [id number]     Arrival date & time 05/16/15  1032 History   First MD Initiated Contact with Patient 05/16/15 1134     Chief Complaint  Patient presents with  . Altered Mental Status     (Consider location/radiation/quality/duration/timing/severity/associated sxs/prior Treatment) HPI [name] is a 81 y.o. male history of seizures on Dilantin therapy, Loop recorder implant, history of PE on anticoagulation, comes in for evaluation of altered mental status. Patient is given by brother who contributes to history of present illness. Brother states patient went to bed per usual and was normal, came into his room at approximately 2 AM wandering around looking for the bathroom. Brother also reports he was staring off talking to people that weren't there. Patient reports that when he has seizures, he will typically by his tongue. He denies any tongue biting, loss of bowel or bladder function. He reports he is compliant on his Dilantin therapy. States went back to bed and now is at baseline. At this time, patient reports right-sided headache, but denies numbness or weakness, fevers or chills, chest pain, cough, shortness of breath, urinary symptoms, abdominal pain.  Past Medical History  Diagnosis Date  . Type II or unspecified type diabetes mellitus without mention of complication, not stated as uncontrolled   . Impotence of organic origin   . Cervicalgia   . Pure hypercholesterolemia   . Essential hypertension, benign   . Pulmonary embolism ([place])   . Cancer [facility])     Prostate  . Seizures ([place])   . History of loop recorder 07/14/2014   Past Surgical History  Procedure Laterality Date  . Loop recorder implant N/A 03/26/2014    Procedure: LOOP RECORDER IMPLANT;  Surgeon: Sanda Klein, MD;  Location: [place] CATH LAB;  Service: Cardiovascular;  Laterality: N/A;   Family History  Problem Relation Age of Onset  . Cancer Mother   . Cancer Father    Social History  Substance Use Topics  .  Smoking status: Former Research scientist (life sciences)  . Smokeless tobacco: Never Used  . Alcohol Use: No    Review of Systems A 10 point review of systems was completed and was negative except for pertinent positives and negatives as mentioned in the history of present illness     Allergies  Review of patient's allergies indicates no known allergies.  Home Medications   Prior to Admission medications   Medication Sig Start Date End Date Taking? Authorizing Provider  acetaminophen (TYLENOL) 325 MG tablet Take 650 mg by mouth daily as needed for moderate pain or headache.    Yes Historical Provider, MD  edoxaban (SAVAYSA) 60 MG TABS tablet Take 60 mg by mouth daily. Patient taking differently: Take 60 mg by mouth at bedtime.  03/26/14  Yes Alexa Sherral Hammers, MD  lisinopril (PRINIVIL,ZESTRIL) 30 MG tablet Take 30 mg by mouth daily.   Yes Historical Provider, MD  phenytoin (DILANTIN) 100 MG ER capsule Take 100-300 mg by mouth 2 (two) times daily. 100mg  in the morning and 300mg  at bedtime   Yes Historical Provider, MD  pravastatin (PRAVACHOL) 40 MG tablet Take 40 mg by mouth at bedtime.   Yes Historical Provider, MD  phenytoin (DILANTIN) 30 MG ER capsule Take 2 capsules (60 mg total) by mouth every morning. Patient not taking: Reported on 05/16/2015 03/26/14   Alexa Sherral Hammers, MD   BP 164/89 mmHg  Pulse 96  Temp(Src) 102.9 F (39.4 C) (Oral)  Resp 22  Ht 6' (1.829 m)  Wt 84.851 kg  BMI 25.36 kg/m2  SpO2 99% Physical Exam  Constitutional: He is oriented to person, place, and time. He appears well-developed and well-nourished.  HENT:  Head: Normocephalic and atraumatic.  Mouth/Throat: Oropharynx is clear and moist.  Eyes: Conjunctivae are normal. Pupils are equal, round, and reactive to light. Right eye exhibits no discharge. Left eye exhibits no discharge. No scleral icterus.  Neck: Neck supple.  Cardiovascular: Normal rate, regular rhythm and normal heart sounds.   Pulmonary/Chest: Effort normal  and breath sounds normal. No respiratory distress. He has no wheezes. He has no rales.  Abdominal: Soft. There is no tenderness.  Musculoskeletal: He exhibits no tenderness.  Neurological: He is alert and oriented to person, place, and time.  Cranial Nerves II-XII grossly intact. Moves all extremities without ataxia. Motor strength is 5/5 in all 4 cavities. Sensation is intact to light touch. Completes finger to nose coordination movements without difficulty. No pronator drift. He is alert and oriented to person, place, year and previous president (Obama)  Skin: Skin is warm and dry. No rash noted.  Psychiatric: He has a normal mood and affect.  Nursing note and vitals reviewed.   ED Course  Procedures (including critical care time) Labs Review Labs Reviewed  COMPREHENSIVE METABOLIC PANEL - Abnormal; Notable for the following:    Sodium 134 (*)    Chloride 98 (*)    Glucose, Bld 151 (*)    ALT 15 (*)    All other components within normal limits  URINALYSIS, ROUTINE W REFLEX MICROSCOPIC (NOT AT [facility]) - Abnormal; Notable for the following:    Protein, ur 30 (*)    All other components within normal limits  URINE MICROSCOPIC-ADD ON - Abnormal; Notable for the following:    Squamous Epithelial / LPF 0-5 (*)    Bacteria, UA FEW (*)    All other components within normal limits  CBG MONITORING, ED - Abnormal; Notable for the following:    Glucose-Capillary 163 (*)    All other components within normal limits  I-STAT CG4 LACTIC ACID, ED - Abnormal; Notable for the following:    Lactic Acid, Venous 3.47 (*)    All other components within normal limits  CULTURE, BLOOD (ROUTINE X 2)  CULTURE, BLOOD (ROUTINE X 2)  URINE CULTURE  CBC WITH DIFFERENTIAL/PLATELET  PHENYTOIN LEVEL, TOTAL  I-STAT CG4 LACTIC ACID, ED  I-STAT CG4 LACTIC ACID, ED    Imaging Review Dg Chest 2 View  05/16/2015  CLINICAL DATA:  Headache.  History of blood clots in lungs. EXAM: CHEST  2 VIEW COMPARISON:   03/25/2014 FINDINGS: Normal heart size. Clear lungs. No pneumothorax. Advanced degenerative changes in the left glenohumeral joint. IMPRESSION: No active cardiopulmonary disease. Electronically Signed   By: Marybelle Killings M.D.   On: 05/16/2015 13:26   Ct Head Wo Contrast  05/16/2015  CLINICAL DATA:  Pt states felt like he was going to pass out but did not pass out. Pt has left frontal headache. ED notes: EXAM: CT HEAD WITHOUT CONTRAST TECHNIQUE: Contiguous axial images were obtained from the base of the skull through the vertex without intravenous contrast. COMPARISON:  03/24/2014 FINDINGS: The ventricles are normal in size and configuration. There are no parenchymal masses or mass effect. There is no evidence a cortical infarct. Patchy white matter hypoattenuation is noted consistent with mild chronic microvascular ischemic change, stable. There are no extra-axial masses or abnormal fluid collections. There is no intracranial hemorrhage. The visualized sinuses and mastoid air cells are clear. IMPRESSION: 1.  No acute intracranial abnormalities. 2. Mild chronic microvascular ischemic change. Stable appearance from the prior study. Electronically Signed   By: Lajean Manes M.D.   On: 05/16/2015 13:11   I have personally reviewed and evaluated these images and lab results as part of my medical decision-making.   EKG Interpretation   Date/Time:  Sunday May 16 2015 11:32:28 EST Ventricular Rate:  94 PR Interval:  242 QRS Duration: 83 QT Interval:  318 QTC Calculation: 398 R Axis:   50 Text Interpretation:  Sinus rhythm Ventricular premature complex Prolonged  PR interval Anteroseptal infarct, old agree. no STEMI. no sig change c/w  old Confirmed by Johnney Killian, MD, Jeannie Done [phone]) on 05/16/2015 3:43:06 PM     Meds given in ED:  Medications  cefTRIAXone (ROCEPHIN) 1 g in dextrose 5 % 50 mL IVPB (not administered)  acetaminophen (TYLENOL) tablet 650 mg (650 mg Oral Given 05/16/15 1204)  sodium  chloride 0.9 % bolus 1,000 mL (1,000 mLs Intravenous New Bag/Given 05/16/15 1200)  ibuprofen (ADVIL,MOTRIN) tablet 800 mg (800 mg Oral Given 05/16/15 1357)    New Prescriptions   No medications on file   Filed Vitals:   05/16/15 1215 05/16/15 1222 05/16/15 1234 05/16/15 1328  BP: 164/89     Pulse: 96     Temp:   102.4 F (39.1 C) 102.9 F (39.4 C)  TempSrc:   Oral Oral  Resp: 22     Height:      Weight:  84.851 kg    SpO2: 99%       MDM  [name] is a 81 y.o. male history of seizures on Dilantin therapy, PE on anticoagulation, comes in for evaluation of confusion. Patient went to bed last night normal, but per brother woke up at 2:00 looking for the bathroom and was incredibly confused. Brother reports patient is still mildly confused at this time. On arrival, patient is febrile at 101.47F, otherwise hemodynamically stable. His physical exam is unremarkable, nonfocal. He does have an elevated lactic acid on arrival at 3.47. CT head is unremarkable as well as chest x-ray. Screening labs are otherwise unremarkable. Urine with some evidence of infection with 6-30 white cells. Urine culture obtained, started on Rocephin in the ED. I believe the patient would benefit from a medical admission for observation to ensure resolving UTI and fever. Discussed with Dr. Posey Pronto, patient admitted to teaching service. Final diagnoses:  UTI (lower urinary tract infection)  Fever, unspecified fever cause        Comer Locket, PA-C 05/16/15 1544  Charlesetta Shanks, MD 05/16/15 [phone]

## 2015-05-16 NOTE — Progress Notes (Signed)
eLink Physician-Brief Progress Note Patient Name: [name] DOB: 1934/04/16 MRN: [id number]   Date of Service  05/16/2015  HPI/Events of Note  Needs Lactic Acid order for sepsis bundle.   eICU Interventions  Will order Lactic Acid level now.      Intervention Category Intermediate Interventions: Best-practice therapies (e.g. DVT, beta blocker, etc.)  Eva Vallee Eugene 05/16/2015, 4:37 PM

## 2015-05-16 NOTE — ED Notes (Signed)
Eddie Dibbles [place] brother [phone] phone [phone]

## 2015-05-16 NOTE — H&P (Signed)
Date: 05/16/2015               Patient Name:  [name] MRN: [id number]  DOB: [birth year] Age / Sex: 81 y.o., male   PCP: Anthony Sake, MD         Medical Service: Internal Medicine Teaching Service         Attending Physician: Dr. Axel Filler, MD    First Contact: Dr. Maryellen Pile Pager: [phone]  Second Contact: Dr. Charlott Rakes Pager: [phone]       After Hours (After 5p/  First Contact Pager: [phone]  weekends / holidays): Second Contact Pager: [phone]   Chief Complaint: Altered Mental Status  History of Present Illness: [name] is a 81 year old male with a history of generalized tonic clonic seizures on Phenytoin, loop recorder implant after possible syncopal episode in 02/2014, saddle PE on 10/2013 currently on Edoxaban anticoagulation, HTN, Prostate cancer s/p radiation, DM type II diet controlled presents to [facility] ED for evaluation of altered mental status. Patient lives with his brother who provided history to the ED physician, was not at bedside at time of admission. Per ED physician, the brother reported the patient was his normal self yesterday but came into his room around 2 AM this morning confused. Reports he was staring off talking to people not in the room. Patient reports he can recall the events of last night and states he was confused. Denies having any seizure activity which normally occurs in his sleep. Usually bites his tongue when he has seizures. Cannot recall the last time he had a seizure. Reports compliance with his medications. Denies any LOC, loss of bowel or bladder function, tongue biting. Does report a bitemporal stabbing headache that is improving. Denies any recent fevers, chills, cough, chest pain, cough, shortness of breath, dysuria, abdominal pain, N/V/D. No sick contacts at home. No recent travel. Denies any recent outdoor activities. Reports he is in a monogamous relationship with last sexual intercourse 2-3 months ago. Denies any new rashes or  penile discharge or lesions.   Meds: No current facility-administered medications for this encounter.   Current Outpatient Prescriptions  Medication Sig Dispense Refill  . acetaminophen (TYLENOL) 325 MG tablet Take 650 mg by mouth daily as needed for moderate pain or headache.     . edoxaban (SAVAYSA) 60 MG TABS tablet Take 60 mg by mouth daily. (Patient taking differently: Take 60 mg by mouth at bedtime. ) 30 tablet 11  . lisinopril (PRINIVIL,ZESTRIL) 30 MG tablet Take 30 mg by mouth daily.    . phenytoin (DILANTIN) 100 MG ER capsule Take 100-300 mg by mouth 2 (two) times daily. 100mg  in the morning and 300mg  at bedtime    . pravastatin (PRAVACHOL) 40 MG tablet Take 40 mg by mouth at bedtime.    . phenytoin (DILANTIN) 30 MG ER capsule Take 2 capsules (60 mg total) by mouth every morning. (Patient not taking: Reported on 05/16/2015) 60 capsule 11    Allergies: Allergies as of 05/16/2015  . (No Known Allergies)   Past Medical History  Diagnosis Date  . Type II or unspecified type diabetes mellitus without mention of complication, not stated as uncontrolled   . Impotence of organic origin   . Cervicalgia   . Pure hypercholesterolemia   . Essential hypertension, benign   . Pulmonary embolism ([place])   . Cancer [facility])     Prostate  . Seizures ([place])   . History of loop recorder 07/14/2014  Past Surgical History  Procedure Laterality Date  . Loop recorder implant N/A 03/26/2014    Procedure: LOOP RECORDER IMPLANT;  Surgeon: Sanda Klein, MD;  Location: [place] CATH LAB;  Service: Cardiovascular;  Laterality: N/A;   Family History  Problem Relation Age of Onset  . Cancer Mother   . Cancer Father    Social History   Social History  . Marital Status: Single    Spouse Name: N/A  . Number of Children: N/A  . Years of Education: 12   Occupational History  . Not on file.   Social History Main Topics  . Smoking status: Former Research scientist (life sciences)  . Smokeless tobacco: Never Used  . Alcohol Use:  No  . Drug Use: No  . Sexual Activity: Not on file   Other Topics Concern  . Not on file   Social History Narrative   He lives with his brother, retired, driving, no smoking, no drinking.    Review of Systems: ROS negative except as noted in HPI  Physical Exam: Blood pressure 122/91, pulse 92, temperature 101.1 F (38.4 C), temperature source Oral, resp. rate 22, height 6' (1.829 m), weight 187 lb 1 oz (84.851 kg), SpO2 97 %. Physical Exam General: alert, well-developed, and cooperative to examination.  Head: normocephalic and atraumatic.  Eyes: vision grossly intact, pupils equal, pupils round, pupils reactive to light, no injection and anicteric.  Mouth: pharynx pink and moist, no erythema, and no exudates.  Neck: supple, full ROM, no thyromegaly, no JVD, and no carotid bruits.  Lungs: normal respiratory effort, no accessory muscle use, normal breath sounds, no crackles, and no wheezes. Heart: tachycardic, regular rhythm, no murmur, no gallop, and no rub.  Abdomen: soft, non-tender, normal bowel sounds, no distention, no guarding, no rebound tenderness Msk: no joint swelling, no joint warmth, and no redness over joints.  Pulses: 2+ DP/PT pulses bilaterally Extremities: No cyanosis, clubbing, edema Neurologic: alert & oriented X3, cranial nerves II-XII intact, strength normal in all extremities, sensation intact to light touch, normal finger to nose, no pronator drift.  Skin: turgor normal and no rashes.    Lab results: Basic Metabolic Panel:  Recent Labs  05/16/15 1132  NA 134*  K 4.1  CL 98*  CO2 22  GLUCOSE 151*  BUN 7  CREATININE 0.90  CALCIUM 9.3   Liver Function Tests:  Recent Labs  05/16/15 1132  AST 28  ALT 15*  ALKPHOS 123  BILITOT 0.3  PROT 7.6  ALBUMIN 4.1   CBC:  Recent Labs  05/16/15 1132  WBC 6.0  NEUTROABS 4.7  HGB 13.9  HCT 42.7  MCV 96.8  PLT 224   CBG:  Recent Labs  05/16/15 1132  GLUCAP 163*   Urine Drug Screen: Drugs  of Abuse     Component Value Date/Time   LABOPIA NONE DETECTED 03/24/2014 1426   COCAINSCRNUR NONE DETECTED 03/24/2014 1426   LABBENZ NONE DETECTED 03/24/2014 1426   AMPHETMU NONE DETECTED 03/24/2014 1426   THCU NONE DETECTED 03/24/2014 1426   LABBARB NONE DETECTED 03/24/2014 1426    Urinalysis:  Recent Labs  05/16/15 1220  COLORURINE YELLOW  LABSPEC 1.015  PHURINE 8.0  GLUCOSEU NEGATIVE  HGBUR NEGATIVE  BILIRUBINUR NEGATIVE  KETONESUR NEGATIVE  PROTEINUR 30*  NITRITE NEGATIVE  LEUKOCYTESUR NEGATIVE   Imaging results:  Dg Chest 2 View  05/16/2015  CLINICAL DATA:  Headache.  History of blood clots in lungs. EXAM: CHEST  2 VIEW COMPARISON:  03/25/2014 FINDINGS: Normal heart size. Clear  lungs. No pneumothorax. Advanced degenerative changes in the left glenohumeral joint. IMPRESSION: No active cardiopulmonary disease. Electronically Signed   By: Marybelle Killings M.D.   On: 05/16/2015 13:26   Ct Head Wo Contrast  05/16/2015  CLINICAL DATA:  Pt states felt like he was going to pass out but did not pass out. Pt has left frontal headache. ED notes: EXAM: CT HEAD WITHOUT CONTRAST TECHNIQUE: Contiguous axial images were obtained from the base of the skull through the vertex without intravenous contrast. COMPARISON:  03/24/2014 FINDINGS: The ventricles are normal in size and configuration. There are no parenchymal masses or mass effect. There is no evidence a cortical infarct. Patchy white matter hypoattenuation is noted consistent with mild chronic microvascular ischemic change, stable. There are no extra-axial masses or abnormal fluid collections. There is no intracranial hemorrhage. The visualized sinuses and mastoid air cells are clear. IMPRESSION: 1. No acute intracranial abnormalities. 2. Mild chronic microvascular ischemic change. Stable appearance from the prior study. Electronically Signed   By: Lajean Manes M.D.   On: 05/16/2015 13:11    Other results: EKG: normal EKG, normal sinus  rhythm, unchanged from previous tracings.  Assessment & Plan by Problem:  Acute Encephalopathy with 2/4 SIRS: Patient with acute episode of confusion overnight. Patient was in normal state of health last night before going to bed and woke up around 2 am and was found by brother confused. No witnessed seizures but patient reports they typically occur in his sleep. Unable to recall last seizure activity. Denies any loss of bowel or bladder function or tongue biting. Patient was still confused upon arrival to the ED this morning around 11 but had no confusion by the time we were called to admit. AAOx3. He was febrile to 102.9 in the ED with tachycardia. CBC and CMP unremarkable. UA with sterile pyuria. Lactic acid was elevated to 3.47 in the ED s/p 2 L NS bolus. CT head and CXR unremarkable. Patient denies any signs/symptoms of infection. Phenytoin level at therapeutic levels. Patient received 1 dose of Ceftriaxone in the ED. No focal deficits on neuro exam. Patient was admitted in 02/2014 with similar symptoms (febrile, tachycarida, tachypneia) with no source of infection or etiology found during that admission as well as similar admission in 2012. Patient did have loop recorder implanted in 02/2014 following possible syncopal episode. Investigation of his loop recorder shows 3 instances of sinus tachycardia since 03/2015 and no other events. Possible that patient was post-ictal and is improving vs potential infectious source though low clinical suspicion for infection at this point.  -D/C ceftriaxone -NS 125 mL/hr -CBC/BMET in am -CK -Telemetry -Repeat Lactic Acid -HIV antibody -RPR -GC/Chlamydia -TSH -f/u urine cultures -f/u blood cultures  Hx of Seizures: Patient reports being on Phenytoin 100 mg qam, 300 mg qhs at home. Unable to recall last seizure though thought 02/2014 admission was due to possible seizure activity. Discharge summary from 02/2014 admission shows neurology recommendation of 160  mg qam. Will increase morning dose.  -Phenytoin 160 mg qam, 300 mg qhs  DM II: Patient with history of DM in EPIC. Not currently on any diabetic medications. No A1c in EPIC. Previous CBGs have run 110-180. CBG 163 today. Does report polydipsia and polyuria.  -Checking A1c  HTN: BP elevated at 165/89 on arrival but improved to 122/91. Patient is on lisinopril 30 mg daily at home. Continue to monitor. -Holding lisinopril   HLD: Patient is on pravastatin 40 mg qhs at home. -Continue home does pravastain  Hx of PE: Patient with saddle PE in 10/2013. On chronic anticoagulation with Edoxaban 60 mg daily. Reports compliance with medications. -continue Edoxaban 60 mg daily  Dispo: Disposition is deferred at this time, awaiting improvement of current medical problems. Anticipated discharge in approximately 1-2 day(s).   The patient does have a current PCP (Anthony Sake, MD) and does need an [facility] hospital follow-up appointment after discharge.  The patient does not have transportation limitations that hinder transportation to clinic appointments.  Signed: Maryellen Pile, MD 05/16/2015, 3:47 PM

## 2015-05-16 NOTE — ED Notes (Addendum)
Pt states he woke up this am about  2 am wondering around( went to bed normal) went back to bed confused got up at10 am and rt side of face was numb  Speech was slurred, pt has hx of sz but denies sz  Because he states his tongue would be bitten . Pt able to walk no, still having confusion, is on  bp med states has h/a

## 2015-05-17 DIAGNOSIS — E119 Type 2 diabetes mellitus without complications: Secondary | ICD-10-CM

## 2015-05-17 DIAGNOSIS — G9340 Encephalopathy, unspecified: Secondary | ICD-10-CM | POA: Diagnosis not present

## 2015-05-17 DIAGNOSIS — G40909 Epilepsy, unspecified, not intractable, without status epilepticus: Secondary | ICD-10-CM

## 2015-05-17 DIAGNOSIS — E785 Hyperlipidemia, unspecified: Secondary | ICD-10-CM

## 2015-05-17 DIAGNOSIS — Z7901 Long term (current) use of anticoagulants: Secondary | ICD-10-CM

## 2015-05-17 DIAGNOSIS — Z86711 Personal history of pulmonary embolism: Secondary | ICD-10-CM

## 2015-05-17 DIAGNOSIS — R509 Fever, unspecified: Secondary | ICD-10-CM | POA: Diagnosis not present

## 2015-05-17 DIAGNOSIS — I10 Essential (primary) hypertension: Secondary | ICD-10-CM

## 2015-05-17 LAB — URINALYSIS W MICROSCOPIC (NOT AT ARMC)
BACTERIA UA: NONE SEEN
Bilirubin Urine: NEGATIVE
Glucose, UA: NEGATIVE mg/dL
HGB URINE DIPSTICK: NEGATIVE
Ketones, ur: NEGATIVE mg/dL
Nitrite: NEGATIVE
PROTEIN: NEGATIVE mg/dL
RBC / HPF: NONE SEEN RBC/hpf (ref 0–5)
SPECIFIC GRAVITY, URINE: 1.006 (ref 1.005–1.030)
SQUAMOUS EPITHELIAL / LPF: NONE SEEN
pH: 5.5 (ref 5.0–8.0)

## 2015-05-17 LAB — RPR: RPR: NONREACTIVE

## 2015-05-17 LAB — CBC WITH DIFFERENTIAL/PLATELET
Basophils Absolute: 0 10*3/uL (ref 0.0–0.1)
Basophils Relative: 0 %
EOS ABS: 0.1 10*3/uL (ref 0.0–0.7)
EOS PCT: 2 %
HCT: 35.8 % — ABNORMAL LOW (ref 39.0–52.0)
HEMOGLOBIN: 11.9 g/dL — AB (ref 13.0–17.0)
LYMPHS ABS: 0.9 10*3/uL (ref 0.7–4.0)
LYMPHS PCT: 31 %
MCH: 32.5 pg (ref 26.0–34.0)
MCHC: 33.2 g/dL (ref 30.0–36.0)
MCV: 97.8 fL (ref 78.0–100.0)
MONOS PCT: 16 %
Monocytes Absolute: 0.5 10*3/uL (ref 0.1–1.0)
NEUTROS PCT: 51 %
Neutro Abs: 1.4 10*3/uL — ABNORMAL LOW (ref 1.7–7.7)
Platelets: 165 10*3/uL (ref 150–400)
RBC: 3.66 MIL/uL — AB (ref 4.22–5.81)
RDW: 13.1 % (ref 11.5–15.5)
WBC: 2.8 10*3/uL — ABNORMAL LOW (ref 4.0–10.5)

## 2015-05-17 LAB — RAPID URINE DRUG SCREEN, HOSP PERFORMED
AMPHETAMINES: NOT DETECTED
BENZODIAZEPINES: NOT DETECTED
Barbiturates: NOT DETECTED
COCAINE: NOT DETECTED
OPIATES: NOT DETECTED
TETRAHYDROCANNABINOL: NOT DETECTED

## 2015-05-17 LAB — BASIC METABOLIC PANEL
Anion gap: 5 (ref 5–15)
BUN: 7 mg/dL (ref 6–20)
CO2: 24 mmol/L (ref 22–32)
CREATININE: 0.86 mg/dL (ref 0.61–1.24)
Calcium: 8.5 mg/dL — ABNORMAL LOW (ref 8.9–10.3)
Chloride: 111 mmol/L (ref 101–111)
GFR calc Af Amer: >60 mL/min (ref >60)
GFR calc non Af Amer: >60 mL/min (ref >60)
Glucose, Bld: 121 mg/dL — ABNORMAL HIGH (ref 65–99)
POTASSIUM: 4 mmol/L (ref 3.5–5.1)
SODIUM: 140 mmol/L (ref 135–145)

## 2015-05-17 LAB — HEMOGLOBIN A1C
HEMOGLOBIN A1C: 6.2 % — AB (ref 4.8–5.6)
MEAN PLASMA GLUCOSE: 131 mg/dL

## 2015-05-17 LAB — HIV ANTIBODY (ROUTINE TESTING W REFLEX): HIV Screen 4th Generation wRfx: NONREACTIVE

## 2015-05-17 LAB — GC/CHLAMYDIA PROBE AMP (~~LOC~~) NOT AT ARMC
CHLAMYDIA, DNA PROBE: NEGATIVE
Neisseria Gonorrhea: NEGATIVE

## 2015-05-17 MED ORDER — PRAVASTATIN SODIUM 40 MG PO TABS
40.0000 mg | ORAL_TABLET | Freq: Every day | ORAL | Status: DC
  Administered 2015-05-17: 40 mg via ORAL
  Filled 2015-05-17: qty 1

## 2015-05-17 NOTE — Care Management Obs Status (Signed)
[place] NOTIFICATION   Patient Details  Name: [name] MRN: [id number] Date of Birth: 1935/03/15   Medicare Observation Status Notification Given:  Yes (acute episode of confusion- )    Dawayne Patricia, RN 05/17/2015, 10:27 AM

## 2015-05-17 NOTE — Progress Notes (Signed)
Subjective: [name] reports no problems this morning. No further episodes of confusion. No fevers or chills overnight. He is AAOx3 and able to do basic calculations with no difficulty.   Objective: Vital signs in last 24 hours: Filed Vitals:   05/16/15 1627 05/16/15 1730 05/16/15 1941 05/17/15 0402  BP: 123/90  147/75 106/62  Pulse: 90  80 74  Temp: 101.2 F (38.4 C) 100.9 F (38.3 C) 98.7 F (37.1 C) 98.5 F (36.9 C)  TempSrc: Oral Oral Oral Oral  Resp: 20  20 20   Height: 6' (1.829 m)     Weight: 187 lb 1 oz (84.851 kg)     SpO2: 97%  97% 97%   Weight change:   Intake/Output Summary (Last 24 hours) at 05/17/15 1055 Last data filed at 05/17/15 0600  Gross per 24 hour  Intake    240 ml  Output    875 ml  Net   -635 ml    GENERAL- alert, co-operative, appears as stated age, not in any distress. AAOx3. HEENT- Atraumatic, normocephalic, PERRL, EOMI, oral mucosa appears moist CARDIAC- RRR, no murmurs, rubs or gallops. RESP- Moving equal volumes of air, and clear to auscultation bilaterally, no wheezes or crackles. ABDOMEN- Soft, nontender, bowel sounds present. NEURO- No obvious Cr N abnormality, no focal defecits EXTREMITIES- pulse 2+, symmetric, no pedal edema. SKIN- Warm, dry, No rash or lesion. PSYCH- Normal mood and affect, appropriate thought content and speech.   Medications: I have reviewed the patient's current medications. Scheduled Meds: . edoxaban  60 mg Oral QHS  . phenytoin  160 mg Oral Q breakfast  . phenytoin  300 mg Oral QHS   Continuous Infusions:  PRN Meds:.acetaminophen **OR** acetaminophen Assessment/Plan:  Acute Encephalopathy: No further episodes of confusion. He is AAOx3 this morning. Denies any complaints. He has remained afebrile since 1730 yesterday with last dose of anti-pyretic at that time. CBC with WBC of 2.8 this morning down from 6 yesterday. BMP unremarkable. UDS negative. UA with sterile pyuria. Lactic acidosis resolved after fluids.  ESR 4. Pro calcitonin < 0.1. RPR non-reactive. CK wnl. TSH wnl. No recent changes in medications. No clear etiology for his acute encephalopathy and fever at this point. Will continue to monitor for reoccurrence of fever today and cultures.  -D/C IVF -telemetry -HIV pending -GC/CHlamydia pending -Urine culture pending -Blood culture pending  Hx of Seizures: Patient reports being on Phenytoin 100 mg qam, 300 mg qhs at home. Unable to recall last seizure though thought 02/2014 admission was due to possible seizure activity. Discharge summary from 02/2014 admission shows neurology recommendation of 160 mg qam.  -Phenytoin 160 mg qam, 300 mg qhs  Hx of DM II: A1C 6.2. Appears to be diet controlled at home. Glucose mildly elevated this am at 121. Will continue to monitor.   HTN: BP stable. -Holding lisinopril, will restart prior to discharge  HLD: Patient is on pravastatin 40 mg qhs at home. -Continue home does pravastain  Hx of PE: Patient with saddle PE in 10/2013. On chronic anticoagulation with Edoxaban 60 mg daily. -continue Edoxaban 60 mg daily  Dispo: Disposition is deferred at this time, awaiting improvement of current medical problems.  Anticipated discharge in approximately 1 day(s).   The patient does have a current PCP (Anthony Sake, MD) and does need an [facility] hospital follow-up appointment after discharge.  The patient does not have transportation limitations that hinder transportation to clinic appointments.  .Services Needed at time of discharge: Y = Yes,  Blank = No PT:   OT:   RN:   Equipment:   Other:       Maryellen Pile, MD IMTS PGY-1 [phone] 05/17/2015, 10:55 AM

## 2015-05-18 DIAGNOSIS — B9689 Other specified bacterial agents as the cause of diseases classified elsewhere: Secondary | ICD-10-CM | POA: Diagnosis not present

## 2015-05-18 DIAGNOSIS — N390 Urinary tract infection, site not specified: Secondary | ICD-10-CM

## 2015-05-18 DIAGNOSIS — R509 Fever, unspecified: Secondary | ICD-10-CM | POA: Diagnosis not present

## 2015-05-18 DIAGNOSIS — G9340 Encephalopathy, unspecified: Secondary | ICD-10-CM | POA: Diagnosis not present

## 2015-05-18 LAB — URINE CULTURE

## 2015-05-18 MED ORDER — SULFAMETHOXAZOLE-TRIMETHOPRIM 800-160 MG PO TABS
1.0000 | ORAL_TABLET | Freq: Two times a day (BID) | ORAL | Status: DC
  Administered 2015-05-18: 1 via ORAL
  Filled 2015-05-18 (×2): qty 1

## 2015-05-18 MED ORDER — PHENYTOIN SODIUM EXTENDED 30 MG PO CAPS: 160.0000 mg | ORAL_CAPSULE | Freq: Every day | ORAL | Status: DC

## 2015-05-18 MED ORDER — SULFAMETHOXAZOLE-TRIMETHOPRIM 800-160 MG PO TABS: 1.0000 | ORAL_TABLET | Freq: Two times a day (BID) | ORAL | Status: DC

## 2015-05-18 MED ORDER — PHENYTOIN SODIUM EXTENDED 100 MG PO CAPS: 300.0000 mg | ORAL_CAPSULE | Freq: Every day | ORAL | Status: DC

## 2015-05-18 MED ORDER — LISINOPRIL 10 MG PO TABS
30.0000 mg | ORAL_TABLET | Freq: Every day | ORAL | Status: DC
  Administered 2015-05-18: 30 mg via ORAL
  Filled 2015-05-18: qty 3

## 2015-05-18 NOTE — Progress Notes (Signed)
Internal Medicine Attending:   I saw and examined the patient. I reviewed the resident's note and I agree with the resident's findings and plan as documented in the resident's note.  81 year old man admitted with acute onset confusion and fever, both which have now spontaneously resolved. Infectious workup was largely reassuring, blood cultures are no growth. His urinalysis was mostly normal, urine culture now growing gram-negative rods. Urinary tract infection is possibly the source of his fever on presentation. Differential would also include seizure activity. Plan will be to treat him for complicated urinary tract infection with 10 days of oral antibiotics. Phenytoin level was normal on presentation, otherwise he's been doing well, so we can continue his same antiepileptic drugs after discharge. I did advise him to follow-up with neurology clinic given that he has had about one seizure per year and may need adjustment of his long term medications. Functionally he is doing well; eating, drinking, and ambulating normally. As long as blood cultures are negative today, he will be ready for discharge to home.

## 2015-05-18 NOTE — Care Management Note (Signed)
Case Management Note  Patient Details  Name: [name] MRN: [id number] Date of Birth: 01/25/1935  Subjective/Objective:     Acute Encephalopathy               Action/Plan: NCM spoke to pt and states he lives at home with his brother. Able to ambulate without DME. States he can afford his medications at home. No NCM needs identified.   Expected Discharge Date:                  Expected Discharge Plan:  Home/Self Care  In-House Referral:  NA  Discharge planning Services  NA  Post Acute Care Choice:  NA Choice offered to:  NA  DME Arranged:  N/A DME Agency:  NA  HH Arranged:  NA HH Agency:  NA  Status of Service:  Completed, signed off  Medicare Important Message Given:    Date Medicare IM Given:    Medicare IM give by:    Date Additional Medicare IM Given:    Additional Medicare Important Message give by:     If discussed at [place] of Stay Meetings, dates discussed:    Additional Comments:  Erenest Rasher, RN 05/18/2015, 2:57 PM

## 2015-05-18 NOTE — Progress Notes (Signed)
   Subjective: Patient with no complaints this morning. No further episodes of confusion. No fevers or chills. Denies any urinary symptoms.   Objective: Vital signs in last 24 hours: Filed Vitals:   05/17/15 0402 05/17/15 1327 05/17/15 2045 05/18/15 0424  BP: 106/62 139/69 139/93 146/86  Pulse: 74 80 72 77  Temp: 98.5 F (36.9 C) 98.7 F (37.1 C) 98.8 F (37.1 C) 98.4 F (36.9 C)  TempSrc: Oral Oral Oral Oral  Resp: 20 18 18 18   Height:      Weight:      SpO2: 97% 100% 99% 100%   Weight change:   Intake/Output Summary (Last 24 hours) at 05/18/15 [id number] Last data filed at 05/17/15 1731  Gross per 24 hour  Intake    480 ml  Output    551 ml  Net    -71 ml    Physical Exam GENERAL- alert, co-operative, appears as stated age, not in any distress. AAOx3. HEENT- Atraumatic, normocephalic, PERRL, EOMI, oral mucosa appears moist CARDIAC- RRR, no murmurs, rubs or gallops. RESP- Moving equal volumes of air, and clear to auscultation bilaterally, no wheezes or crackles. ABDOMEN- Soft, nontender, bowel sounds present. SKIN- Warm, dry, No rash or lesion. PSYCH- Normal mood and affect, appropriate thought content and speech.   Medications: I have reviewed the patient's current medications. Scheduled Meds: . edoxaban  60 mg Oral QHS  . phenytoin  160 mg Oral Q breakfast  . phenytoin  300 mg Oral QHS  . pravastatin  40 mg Oral QHS   Continuous Infusions:  PRN Meds:.acetaminophen **OR** acetaminophen Assessment/Plan:  Acute Encephalopathy possibly 2/2 UTI: No further episodes of confusion. Has remained afebrile since day of admission. Urine culture growing GNRs. Blood cultures with NGTD. Patient denies any urinary symptoms. Possible that UTI lowered his seizure threshold and he had a small seizure that cause his confusion. Will start Bactrim 800-160mg  bid for 10 day course. Likely discharge this afternoon once blood cultures NG at 2 days.  -HIV non-reactive -GC/CHlamydia  negative -Urine culture GNR -Blood culture NGTD  Hx of Seizures: Patient reports being on Phenytoin 100 mg qam, 300 mg qhs at home. Unable to recall last seizure though thought 02/2014 admission was due to possible seizure activity. Discharge summary from 02/2014 admission shows neurology recommendation of 160 mg qam.  -Phenytoin 160 mg qam, 300 mg qhs  Hx of DM II: A1C 6.2. Appears to be diet controlled at home. Will continue to monitor.   HTN: BP stable. -Restart home dose lisinopril 30 mg daily  HLD: Patient is on pravastatin 40 mg qhs at home. -Continue home does pravastain  Hx of PE: Patient with saddle PE in 10/2013. On chronic anticoagulation with Edoxaban 60 mg daily. -continue Edoxaban 60 mg daily  Dispo: Anticipated discharge today  The patient does have a current PCP (Leonides Sake, MD) and does need an [facility] hospital follow-up appointment after discharge.  The patient does not have transportation limitations that hinder transportation to clinic appointments.  .Services Needed at time of discharge: Y = Yes, Blank = No PT:   OT:   RN:   Equipment:   Other:       Maryellen Pile, MD IMTS PGY-1 [phone] 05/18/2015, 8:23 AM

## 2015-05-18 NOTE — Discharge Summary (Signed)
Name: [name] MRN: [id number] DOB: 02-Jan-1935 81 y.o. PCP: Leonides Sake, MD  Date of Admission: 05/16/2015 11:14 AM Date of Discharge: 05/18/2015 Attending Physician: Axel Filler, MD  Discharge Diagnosis: Principal Problem:   Acute encephalopathy Active Problems:   Seizures ([place])   Hx pulmonary embolism   HTN (hypertension)   History of loop recorder   Pyrexia  Discharge Medications:   Medication List    STOP taking these medications        TYLENOL 325 MG tablet  Generic drug:  acetaminophen      TAKE these medications        edoxaban 60 MG Tabs tablet  Commonly known as:  SAVAYSA  Take 60 mg by mouth daily.     lisinopril 30 MG tablet  Commonly known as:  PRINIVIL,ZESTRIL  Take 30 mg by mouth daily.     phenytoin 100 MG ER capsule  Commonly known as:  DILANTIN  Take 3 capsules (300 mg total) by mouth at bedtime. 172m in the morning and 3076mat bedtime     phenytoin 30 MG ER capsule  Commonly known as:  DILANTIN  Take 5 capsules (150 mg total) by mouth daily with breakfast.     pravastatin 40 MG tablet  Commonly known as:  PRAVACHOL  Take 40 mg by mouth at bedtime.     sulfamethoxazole-trimethoprim 800-160 MG tablet  Commonly known as:  BACTRIM DS,SEPTRA DS  Take 1 tablet by mouth every 12 (twelve) hours.        Disposition and follow-up:   [facility]  At the hospital follow up visit please address:  1.  Acute encephalopathy possibly 2/2 uti: enterobacter growing on urine culture. Started on bactrim for 10 day course. Adjusted phenytoin based on last neurology recs at last hospitalization (160 mg qam, 300 qhs).   2.  Labs / imaging needed at time of follow-up: None  3.  Pending labs/ test needing follow-up: blood cultures  Follow-up Appointments: Follow-up Information    [facility] Schedule an appointment as soon as possible for a visit on  05/25/2015.   Specialty:  Family Medicine   Why:  pt app is on 05/25/15 @1 :45pm    Contact information:   [id number] [phone]     Discharge Instructions: Discharge Instructions    Diet - low sodium heart healthy    Complete by:  As directed      Increase activity slowly    Complete by:  As directed            Consultations:    Procedures Performed:  Dg Chest 2 View  05/16/2015  CLINICAL DATA:  Headache.  History of blood clots in lungs. EXAM: CHEST  2 VIEW COMPARISON:  03/25/2014 FINDINGS: Normal heart size. Clear lungs. No pneumothorax. Advanced degenerative changes in the left glenohumeral joint. IMPRESSION: No active cardiopulmonary disease. Electronically Signed   By: ArMarybelle Killings.D.   On: 05/16/2015 13:26   Ct Head Wo Contrast  05/16/2015  CLINICAL DATA:  Pt states felt like he was going to pass out but did not pass out. Pt has left frontal headache. ED notes: EXAM: CT HEAD WITHOUT CONTRAST TECHNIQUE: Contiguous axial images were obtained from the base of the skull through the vertex without intravenous contrast. COMPARISON:  03/24/2014 FINDINGS: The ventricles are normal in size and configuration. There are  no parenchymal masses or mass effect. There is no evidence a cortical infarct. Patchy white matter hypoattenuation is noted consistent with mild chronic microvascular ischemic change, stable. There are no extra-axial masses or abnormal fluid collections. There is no intracranial hemorrhage. The visualized sinuses and mastoid air cells are clear. IMPRESSION: 1. No acute intracranial abnormalities. 2. Mild chronic microvascular ischemic change. Stable appearance from the prior study. Electronically Signed   By: Lajean Manes M.D.   On: 05/16/2015 13:11   Admission HPI: [name] is a 81 year old male with a history of generalized tonic clonic seizures on Phenytoin, loop recorder implant after possible syncopal episode in 02/2014, saddle PE  on 10/2013 currently on Edoxaban anticoagulation, HTN, Prostate cancer s/p radiation, DM type II diet controlled presents to [facility] ED for evaluation of altered mental status. Patient lives with his brother who provided history to the ED physician, was not at bedside at time of admission. Per ED physician, the brother reported the patient was his normal self yesterday but came into his room around 2 AM this morning confused. Reports he was staring off talking to people not in the room. Patient reports he can recall the events of last night and states he was confused. Denies having any seizure activity which normally occurs in his sleep. Usually bites his tongue when he has seizures. Cannot recall the last time he had a seizure. Reports compliance with his medications. Denies any LOC, loss of bowel or bladder function, tongue biting. Does report a bitemporal stabbing headache that is improving. Denies any recent fevers, chills, cough, chest pain, cough, shortness of breath, dysuria, abdominal pain, N/V/D. No sick contacts at home. No recent travel. Denies any recent outdoor activities. Reports he is in a monogamous relationship with last sexual intercourse 2-3 months ago. Denies any new rashes or penile discharge or lesions.   Hospital Course by problem list:   Acute Encephalopathy possibly 2/2 UTI: Patient with acute episode of confusion overnight prior to admission. Patient was in normal state of health before going to bed and woke up around 2 am and was found by brother confused. No witnessed seizures but patient reports they typically occur in his sleep. Unable to recall last seizure activity. Denies any loss of bowel or bladder function or tongue biting. Patient was still confused upon arrival to the ED but had no confusion by the time we were called to admit. He was febrile to 102.9 in the ED with tachycardia. CBC and CMP unremarkable. UA with sterile pyuria. Lactic acid was elevated to 3.47 in the ED but  resolved after 2 L NS boluses. CT head and CXR were unremarkable. Patient denies any signs/symptoms of infection. Phenytoin level at therapeutic levels. No focal deficits on neuro exam. Patient was admitted in 02/2014 with similar symptoms (febrile, tachycarida, tachypneia) with no source of infection or etiology found during that admission as well as similar admission in 2012. Patient did have loop recorder implanted in 02/2014 following possible syncopal episode. Investigation of his loop recorder shows 3 instances of sinus tachycardia since 03/2015 and no other events. No evidence of sinusitis, cellulitis, arthritis or new DVT on exam. No new medications. TSH, RPR, ESR, procalcitonin, HIV and GC/Chlamydia all negative. Urine culture did grow enterobacter. Started on Bactrim for 10 day course. Blood cultures were NGT at 2 days. No further fevers during hospitalization. Possible that UTI lowered his seizure threshold and he had a seizure that caused his initial confusion. Recommended close follow up with  neurology after discharge.   Hx of Seizures: Patient reports being on Phenytoin 100 mg qam, 300 mg qhs at home. Unable to recall last seizure though thought 02/2014 admission was due to possible seizure activity. Discharge summary from 02/2014 admission shows neurology recommendation of 160 mg qam. Increased morning dose to Phenytoin 160 mg qam, 300 mg qhs.  DM II: Patient with history of DM in EPIC. Not currently on any diabetic medications. A1c 6.2.   HTN: BP stable during hospitalization. Patient is on lisinopril 30 mg daily at home. Continued home dose lisinopril.  HLD: Patient is on pravastatin 40 mg qhs at home. Continued home dose pravastain.  Hx of PE: Patient with saddle PE in 10/2013. On chronic anticoagulation with Edoxaban 60 mg daily. Reports compliance with medications. Continued Edoxaban 60 mg daily.  Discharge Vitals:   BP 133/59 mmHg  Pulse 80  Temp(Src) 98.4 F (36.9 C) (Oral)  Resp  20  Ht 6' (1.829 m)  Wt 187 lb 1 oz (84.851 kg)  BMI 25.36 kg/m2  SpO2 99%  Discharge Labs:  No results found for this or any previous visit (from the past 24 hour(s)).  Signed: Maryellen Pile, MD 05/18/2015, 2:33 PM

## 2015-05-18 NOTE — Discharge Instructions (Signed)
[name], we found an infection in your urine. We are giving you a prescription for an antibiotic called Bactrim. You will take it twice a day for the next 10 days until the bottle runs out.   Please follow up with your PCP in the next week. Please also schedule an appointment with your Neurologist as it is possible you had another seizure that caused the confusion that brought you in to the hospital. It is possible that the urinary infection made you more susceptible to having a seizure.  Sulfamethoxazole; Trimethoprim, SMX-TMP tablets (AKA Bactrim) What is this medicine? SULFAMETHOXAZOLE; TRIMETHOPRIM or SMX-TMP (suhl fuh meth OK suh zohl; trye METH oh prim) is a combination of a sulfonamide antibiotic and a second antibiotic, trimethoprim. It is used to treat or prevent certain kinds of bacterial infections. It will not work for colds, flu, or other viral infections. This medicine may be used for other purposes; ask your health care provider or pharmacist if you have questions. What should I tell my health care provider before I take this medicine? They need to know if you have any of these conditions: -anemia -asthma -being treated with anticonvulsants -if you frequently drink alcohol containing drinks -kidney disease -liver disease -low level of folic acid or [id number] dehydrogenase -poor nutrition or malabsorption -porphyria -severe allergies -thyroid disorder -an unusual or allergic reaction to sulfamethoxazole, trimethoprim, sulfa drugs, other medicines, foods, dyes, or preservatives -pregnant or trying to get pregnant -breast-feeding How should I use this medicine? Take this medicine by mouth with a full glass of water. Follow the directions on the prescription label. Take your medicine at regular intervals. Do not take it more often than directed. Do not skip doses or stop your medicine early. Talk to your pediatrician regarding the use of this medicine in children.  Special care may be needed. This medicine has been used in children as young as 28 months of age. Overdosage: If you think you have taken too much of this medicine contact a poison control center or emergency room at once. NOTE: This medicine is only for you. Do not share this medicine with others. What if I miss a dose? If you miss a dose, take it as soon as you can. If it is almost time for your next dose, take only that dose. Do not take double or extra doses. What may interact with this medicine? Do not take this medicine with any of the following medications: -aminobenzoate potassium -dofetilide -metronidazole This medicine may also interact with the following medications: -ACE inhibitors like benazepril, enalapril, lisinopril, and ramipril -birth control pills -cyclosporine -digoxin -diuretics -indomethacin -medicines for diabetes -methenamine -methotrexate -phenytoin -potassium supplements -pyrimethamine -sulfinpyrazone -tricyclic antidepressants -warfarin This list may not describe all possible interactions. Give your health care provider a list of all the medicines, herbs, non-prescription drugs, or dietary supplements you use. Also tell them if you smoke, drink alcohol, or use illegal drugs. Some items may interact with your medicine. What should I watch for while using this medicine? Tell your doctor or health care professional if your symptoms do not improve. Drink several glasses of water a day to reduce the risk of kidney problems. Do not treat diarrhea with over the counter products. Contact your doctor if you have diarrhea that lasts more than 2 days or if it is severe and watery. This medicine can make you more sensitive to the sun. Keep out of the sun. If you cannot avoid being in the sun, wear protective clothing and  use a sunscreen. Do not use sun lamps or tanning beds/booths. What side effects may I notice from receiving this medicine? Side effects that you should  report to your doctor or health care professional as soon as possible: -allergic reactions like skin rash or hives, swelling of the face, lips, or tongue -breathing problems -fever or chills, sore throat -irregular heartbeat, chest pain -joint or muscle pain -pain or difficulty passing urine -red pinpoint spots on skin -redness, blistering, peeling or loosening of the skin, including inside the mouth -unusual bleeding or bruising -unusually weak or tired -yellowing of the eyes or skin Side effects that usually do not require medical attention (report to your doctor or health care professional if they continue or are bothersome): -diarrhea -dizziness -headache -loss of appetite -nausea, vomiting -nervousness This list may not describe all possible side effects. Call your doctor for medical advice about side effects. You may report side effects to FDA at 1-800-FDA-1088. Where should I keep my medicine? Keep out of the reach of children. Store at room temperature between 20 to 25 degrees C (68 to 77 degrees F). Protect from light. Throw away any unused medicine after the expiration date. NOTE: This sheet is a summary. It may not cover all possible information. If you have questions about this medicine, talk to your doctor, pharmacist, or health care provider.    2016, Elsevier/Gold Standard. (2012-10-18 14:38:26)

## 2015-05-20 ENCOUNTER — Ambulatory Visit (INDEPENDENT_AMBULATORY_CARE_PROVIDER_SITE_OTHER): Payer: Medicare Other | Admitting: *Deleted

## 2015-05-20 DIAGNOSIS — R55 Syncope and collapse: Secondary | ICD-10-CM

## 2015-05-20 LAB — CUP PACEART REMOTE DEVICE CHECK: Date Time Interrogation Session: 20170223220713

## 2015-05-21 LAB — CULTURE, BLOOD (ROUTINE X 2)
CULTURE: NO GROWTH
Culture: NO GROWTH

## 2015-05-21 NOTE — Progress Notes (Signed)
[facility] Summary Report / Loop Recorder 

## 2015-05-25 DIAGNOSIS — Z6827 Body mass index (BMI) 27.0-27.9, adult: Secondary | ICD-10-CM | POA: Diagnosis not present

## 2015-05-25 DIAGNOSIS — G40909 Epilepsy, unspecified, not intractable, without status epilepticus: Secondary | ICD-10-CM | POA: Diagnosis not present

## 2015-05-25 DIAGNOSIS — N390 Urinary tract infection, site not specified: Secondary | ICD-10-CM | POA: Diagnosis not present

## 2015-05-25 DIAGNOSIS — E663 Overweight: Secondary | ICD-10-CM | POA: Diagnosis not present

## 2015-05-25 DIAGNOSIS — G9340 Encephalopathy, unspecified: Secondary | ICD-10-CM | POA: Diagnosis not present

## 2015-06-04 DIAGNOSIS — N390 Urinary tract infection, site not specified: Secondary | ICD-10-CM | POA: Diagnosis not present

## 2015-06-06 LAB — CUP PACEART REMOTE DEVICE CHECK: Date Time Interrogation Session: 20170124220621

## 2015-06-07 ENCOUNTER — Other Ambulatory Visit: Payer: Self-pay | Admitting: Internal Medicine

## 2015-06-21 ENCOUNTER — Ambulatory Visit (INDEPENDENT_AMBULATORY_CARE_PROVIDER_SITE_OTHER): Payer: Medicare Other | Admitting: *Deleted

## 2015-06-21 DIAGNOSIS — R55 Syncope and collapse: Secondary | ICD-10-CM

## 2015-06-21 NOTE — Progress Notes (Signed)
[facility] Summary Report / Loop Recorder 

## 2015-07-15 DIAGNOSIS — E119 Type 2 diabetes mellitus without complications: Secondary | ICD-10-CM | POA: Diagnosis not present

## 2015-07-15 DIAGNOSIS — I10 Essential (primary) hypertension: Secondary | ICD-10-CM | POA: Diagnosis not present

## 2015-07-15 DIAGNOSIS — E7800 Pure hypercholesterolemia, unspecified: Secondary | ICD-10-CM | POA: Diagnosis not present

## 2015-07-15 DIAGNOSIS — Z79899 Other long term (current) drug therapy: Secondary | ICD-10-CM | POA: Diagnosis not present

## 2015-07-15 DIAGNOSIS — I2692 Saddle embolus of pulmonary artery without acute cor pulmonale: Secondary | ICD-10-CM | POA: Diagnosis not present

## 2015-07-15 DIAGNOSIS — G40909 Epilepsy, unspecified, not intractable, without status epilepticus: Secondary | ICD-10-CM | POA: Diagnosis not present

## 2015-07-15 DIAGNOSIS — Z6827 Body mass index (BMI) 27.0-27.9, adult: Secondary | ICD-10-CM | POA: Diagnosis not present

## 2015-07-19 ENCOUNTER — Ambulatory Visit (INDEPENDENT_AMBULATORY_CARE_PROVIDER_SITE_OTHER): Payer: Medicare Other | Admitting: *Deleted

## 2015-07-19 DIAGNOSIS — R55 Syncope and collapse: Secondary | ICD-10-CM | POA: Diagnosis not present

## 2015-07-20 NOTE — Progress Notes (Signed)
[facility] Summary Report / Loop Recorder 

## 2015-07-23 DIAGNOSIS — Z79899 Other long term (current) drug therapy: Secondary | ICD-10-CM | POA: Diagnosis not present

## 2015-08-04 ENCOUNTER — Other Ambulatory Visit: Payer: Self-pay | Admitting: Internal Medicine

## 2015-08-06 NOTE — Progress Notes (Signed)
Carelink summary report received. Battery status OK. Normal device function. No new symptom episodes, tachy episodes, brady, or pause episodes. 1 tachy- reg. VS. No new AF episodes. Monthly summary reports and ROV PRN.

## 2015-08-18 ENCOUNTER — Ambulatory Visit (INDEPENDENT_AMBULATORY_CARE_PROVIDER_SITE_OTHER): Payer: Medicare Other | Admitting: *Deleted

## 2015-08-18 DIAGNOSIS — R55 Syncope and collapse: Secondary | ICD-10-CM | POA: Diagnosis not present

## 2015-08-19 NOTE — Progress Notes (Signed)
[facility] Summary Report / Loop Recorder 

## 2015-08-21 LAB — CUP PACEART REMOTE DEVICE CHECK: Date Time Interrogation Session: 20170325223826

## 2015-08-21 NOTE — Progress Notes (Signed)
Carelink summary report received. Battery status OK. Normal device function. No new symptom episodes, brady, or pause episodes. No new AF episodes. 1 tachy- no ECG. Monthly summary reports and ROV/PRN

## 2015-08-23 LAB — CUP PACEART REMOTE DEVICE CHECK: Date Time Interrogation Session: 20170424230624

## 2015-08-23 NOTE — Progress Notes (Signed)
Carelink summary report received. Battery status OK. Normal device function. No new symptom episodes, tachy episodes, brady, or pause episodes. No new AF episodes. Monthly summary reports and ROV/PRN 

## 2015-09-17 ENCOUNTER — Ambulatory Visit (INDEPENDENT_AMBULATORY_CARE_PROVIDER_SITE_OTHER): Payer: Medicare Other | Admitting: *Deleted

## 2015-09-17 DIAGNOSIS — R55 Syncope and collapse: Secondary | ICD-10-CM | POA: Diagnosis not present

## 2015-09-20 NOTE — Progress Notes (Signed)
[facility] Summary Report / Loop Recorder 

## 2015-09-23 LAB — CUP PACEART REMOTE DEVICE CHECK: MDC IDC SESS DTM: 20170524230717

## 2015-10-08 LAB — CUP PACEART REMOTE DEVICE CHECK: MDC IDC SESS DTM: 20170623233752

## 2015-10-12 ENCOUNTER — Telehealth: Payer: Self-pay | Admitting: *Deleted

## 2015-10-18 ENCOUNTER — Ambulatory Visit (INDEPENDENT_AMBULATORY_CARE_PROVIDER_SITE_OTHER): Payer: Medicare Other | Admitting: *Deleted

## 2015-10-18 DIAGNOSIS — R55 Syncope and collapse: Secondary | ICD-10-CM

## 2015-10-18 NOTE — Progress Notes (Signed)
[facility] Summary Report / Loop Recorder 

## 2015-10-26 LAB — CUP PACEART REMOTE DEVICE CHECK: Date Time Interrogation Session: 20170724000623

## 2015-10-28 ENCOUNTER — Encounter: Payer: Self-pay | Admitting: Cardiovascular Disease

## 2015-11-16 ENCOUNTER — Encounter: Payer: Medicare Other | Admitting: *Deleted

## 2015-11-25 DIAGNOSIS — G40909 Epilepsy, unspecified, not intractable, without status epilepticus: Secondary | ICD-10-CM | POA: Diagnosis not present

## 2015-11-25 DIAGNOSIS — Z9181 History of falling: Secondary | ICD-10-CM | POA: Diagnosis not present

## 2015-11-25 DIAGNOSIS — Z79899 Other long term (current) drug therapy: Secondary | ICD-10-CM | POA: Diagnosis not present

## 2015-11-25 DIAGNOSIS — E7800 Pure hypercholesterolemia, unspecified: Secondary | ICD-10-CM | POA: Diagnosis not present

## 2015-11-25 DIAGNOSIS — I2692 Saddle embolus of pulmonary artery without acute cor pulmonale: Secondary | ICD-10-CM | POA: Diagnosis not present

## 2015-11-25 DIAGNOSIS — I10 Essential (primary) hypertension: Secondary | ICD-10-CM | POA: Diagnosis not present

## 2015-11-25 DIAGNOSIS — Z1389 Encounter for screening for other disorder: Secondary | ICD-10-CM | POA: Diagnosis not present

## 2015-11-25 DIAGNOSIS — Z6827 Body mass index (BMI) 27.0-27.9, adult: Secondary | ICD-10-CM | POA: Diagnosis not present

## 2015-11-25 DIAGNOSIS — E119 Type 2 diabetes mellitus without complications: Secondary | ICD-10-CM | POA: Diagnosis not present

## 2015-12-10 DIAGNOSIS — D649 Anemia, unspecified: Secondary | ICD-10-CM | POA: Diagnosis not present

## 2015-12-10 DIAGNOSIS — Z6827 Body mass index (BMI) 27.0-27.9, adult: Secondary | ICD-10-CM | POA: Diagnosis not present

## 2015-12-10 DIAGNOSIS — R21 Rash and other nonspecific skin eruption: Secondary | ICD-10-CM | POA: Diagnosis not present

## 2015-12-10 DIAGNOSIS — Z79899 Other long term (current) drug therapy: Secondary | ICD-10-CM | POA: Diagnosis not present

## 2015-12-10 DIAGNOSIS — D509 Iron deficiency anemia, unspecified: Secondary | ICD-10-CM | POA: Diagnosis not present

## 2015-12-16 ENCOUNTER — Ambulatory Visit (INDEPENDENT_AMBULATORY_CARE_PROVIDER_SITE_OTHER): Payer: Medicare Other | Admitting: *Deleted

## 2015-12-16 DIAGNOSIS — R55 Syncope and collapse: Secondary | ICD-10-CM | POA: Diagnosis not present

## 2015-12-17 NOTE — Progress Notes (Signed)
[facility] Summary Report / Loop Recorder 

## 2015-12-23 ENCOUNTER — Ambulatory Visit (INDEPENDENT_AMBULATORY_CARE_PROVIDER_SITE_OTHER): Payer: Medicare Other | Admitting: Cardiovascular Disease

## 2015-12-23 ENCOUNTER — Encounter: Payer: Self-pay | Admitting: Cardiovascular Disease

## 2015-12-23 VITALS — BP 120/62 | HR 77 | Ht 72.0 in | Wt 185.8 lb

## 2015-12-23 DIAGNOSIS — Z4509 Encounter for adjustment and management of other cardiac device: Secondary | ICD-10-CM

## 2015-12-23 DIAGNOSIS — R55 Syncope and collapse: Secondary | ICD-10-CM | POA: Diagnosis not present

## 2015-12-23 DIAGNOSIS — I10 Essential (primary) hypertension: Secondary | ICD-10-CM

## 2015-12-23 DIAGNOSIS — R21 Rash and other nonspecific skin eruption: Secondary | ICD-10-CM

## 2015-12-23 DIAGNOSIS — E785 Hyperlipidemia, unspecified: Secondary | ICD-10-CM | POA: Diagnosis not present

## 2015-12-23 DIAGNOSIS — Z7901 Long term (current) use of anticoagulants: Secondary | ICD-10-CM

## 2015-12-23 NOTE — Progress Notes (Signed)
Cardiology Office Note    Date:  12/23/2015   ID:  [name], DOB 1934/06/23, MRN [id number]  PCP:  Leonides Sake, MD  Cardiologist:   Sanda Klein, MD   Chief Complaint  Patient presents with  . Follow-up    patient reports a rash that has lasted ~1 month- on left side of neck and down left arm. patient states it feels like "something is crawling up and down my left side." reports no other complaints.    History of Present Illness:  [name] is a 81 y.o. male with a history of seizures and a separate event that appeared to be syncope, f.or which he received a loop recorder almost 2 years ago. The device has shown a few episodes of sinus tachycardia, but no significant bradycardia/pauses or ventricular arrhythmia to explain syncope. He has not had recurrent syncope since. Additional medical problems include pulmonary embolism, hypertension and hyperlipidemia. He is compliant with anticoagulant, antihypertensive and lipid-lowering therapy. Previous echocardiogram shows a mildly dilated ascending aorta at 45 mm but otherwise no structural heart problems. CT angiography did not confirm the presence of aortic dilation.  His only complaint today is a very pruritic rash over the left side of his neck, supraclavicular, shoulder and upper arm area. This has improved and is now hyperpigmented and scabbed over, but it was very itchy for a long while. He does not appear to be associated with any new medication. Has angina pectoris or other chest pain, palpitations, dyspnea, syncope, leg edema, new neurological complaints, claudication.  Loop recorder interrogation today shows no recent events. An episode of tachycardia was recorded in February and has an appearance strongly suggestive of sinus tachycardia with gradual onset.  Past Medical History:  Diagnosis Date  . Cancer [facility])    Prostate  . Cervicalgia   . Essential hypertension, benign   . History of loop recorder 07/14/2014  . Impotence of  organic origin   . Pulmonary embolism ([place])   . Pure hypercholesterolemia   . Seizures ([place])   . Type II or unspecified type diabetes mellitus without mention of complication, not stated as uncontrolled     Past Surgical History:  Procedure Laterality Date  . LOOP RECORDER IMPLANT N/A 03/26/2014   Procedure: LOOP RECORDER IMPLANT;  Surgeon: Sanda Klein, MD;  Location: [place] CATH LAB;  Service: Cardiovascular;  Laterality: N/A;    Current Medications: Outpatient Medications Prior to Visit  Medication Sig Dispense Refill  . edoxaban (SAVAYSA) 60 MG TABS tablet Take 60 mg by mouth daily. (Patient taking differently: Take 60 mg by mouth at bedtime. ) 30 tablet 11  . lisinopril (PRINIVIL,ZESTRIL) 30 MG tablet Take 30 mg by mouth daily.    . pravastatin (PRAVACHOL) 40 MG tablet Take 40 mg by mouth at bedtime.    . phenytoin (DILANTIN) 100 MG ER capsule Take 3 capsules (300 mg total) by mouth at bedtime. 100mg  in the morning and 300mg  at bedtime 90 capsule 0  . phenytoin (DILANTIN) 30 MG ER capsule Take 5 capsules (150 mg total) by mouth daily with breakfast. (Patient taking differently: Take 30 mg by mouth daily. With 100 mg tablet in the morning.) 150 capsule 2  . sulfamethoxazole-trimethoprim (BACTRIM DS,SEPTRA DS) 800-160 MG tablet Take 1 tablet by mouth every 12 (twelve) hours. 19 tablet 0   No facility-administered medications prior to visit.      Allergies:   Review of patient's allergies indicates no known allergies.   Social History   Social History  .  Marital status: Single    Spouse name: N/A  . Number of children: N/A  . Years of education: 24   Social History Main Topics  . Smoking status: Former Research scientist (life sciences)  . Smokeless tobacco: Never Used  . Alcohol use No  . Drug use: No  . Sexual activity: Not Asked   Other Topics Concern  . None   Social History Narrative   He lives with his brother, retired, driving, no smoking, no drinking.     Family History:  The patient's  family history includes Cancer in his father and mother.   ROS:   Please see the history of present illness.    ROS All other systems reviewed and are negative.   PHYSICAL EXAM:   VS:  BP 120/62 (BP Location: Right Arm, Patient Position: Sitting, Cuff Size: Normal)   Pulse 77   Ht 6' (1.829 m)   Wt 84.3 kg (185 lb 12.8 oz)   BMI 25.20 kg/m    GEN: Well nourished, well developed, in no acute distress  HEENT: normal  Neck: no JVD, carotid bruits, or masses Cardiac: RRR; no murmurs, rubs, or gallops,no edema  Respiratory:  clear to auscultation bilaterally, normal work of breathing GI: soft, nontender, nondistended, + BS MS: no deformity or atrophy  Skin: warm and dry, Hyperpigmented slightly raised rash especially over his lower neck and supraclavicular area on the left side only, extending to the shoulder and upper arm, some excoriations from scratching Neuro:  Alert and Oriented x 3, Strength and sensation are intact Psych: euthymic mood, full affect  Wt Readings from Last 3 Encounters:  12/23/15 84.3 kg (185 lb 12.8 oz)  05/16/15 84.9 kg (187 lb 1 oz)  07/14/14 89.1 kg (196 lb 8 oz)      Studies/Labs Reviewed:   EKG:  EKG is ordered today.  The ekg ordered today demonstrates Normal sinus rhythm with a single PVC  Recent Labs: 05/16/2015: ALT 15; TSH 0.460 05/17/2015: BUN 7; Creatinine, Ser 0.86; Hemoglobin 11.9; Platelets 165; Potassium 4.0; Sodium 140   Lipid Panel    Component Value Date/Time   CHOL 155 01/17/2011 1915   TRIG 63 01/17/2011 1915   HDL 50 01/17/2011 1915   CHOLHDL 3.1 01/17/2011 1915   VLDL 13 01/17/2011 1915   [place] 92 01/17/2011 1915     ASSESSMENT:    1. Syncope, unspecified syncope type   2. Encounter for loop recorder check   3. Essential hypertension   4. Dyslipidemia   5. Chronic anticoagulation   6. Rash of neck      PLAN:  In order of problems listed above:  1. No recurrence of syncope since Loop recorder implantation,  wonder if this was just another episode of seizure with an unusual presentation. 2. Normal loop recorder function, continue remote monitoring 3. Hypertension, well controlled 4. Hyperlipidemia on statin, I don't have a recent lipid profile. Followed by Dr. Lisbeth Ply. 5. History of pulmonary embolism on chronic direct oral anticoagulant without bleeding problems 6. Mildly dilated ascending aorta by echo, not confirmed by CT Angio. 7. Rash distribution suggests contact dermatitis. No obvious etiology identified. No recent medication changes. It seems to be improving spontaneously.    Medication Adjustments/Labs and Tests Ordered: Current medicines are reviewed at length with the patient today.  Concerns regarding medicines are outlined above.  Medication changes, Labs and Tests ordered today are listed in the Patient Instructions below. Patient Instructions  Dr Sallyanne Kuster recommends that you schedule a follow-up appointment in  1 year. You will receive a reminder letter in the mail two months in advance. If you don't receive a letter, please call our office to schedule the follow-up appointment.  If you need a refill on your cardiac medications before your next appointment, please call your pharmacy.    Signed, Sanda Klein, MD  12/23/2015 8:56 AM    [facility] [address], [place], [place]  [address] Phone: [phone]; Fax: [phone]

## 2015-12-23 NOTE — Patient Instructions (Signed)
Dr Croitoru recommends that you schedule a follow-up appointment in 1 year. You will receive a reminder letter in the mail two months in advance. If you don't receive a letter, please call our office to schedule the follow-up appointment.  If you need a refill on your cardiac medications before your next appointment, please call your pharmacy. 

## 2015-12-29 LAB — CUP PACEART INCLINIC DEVICE CHECK: Date Time Interrogation Session: 20170928110006

## 2016-01-13 LAB — CUP PACEART REMOTE DEVICE CHECK: MDC IDC SESS DTM: 20170922003914

## 2016-01-13 NOTE — Progress Notes (Signed)
Carelink summary report received. Battery status OK. Normal device function. No new symptom episodes, tachy episodes, brady, or pause episodes. No new AF episodes. Monthly summary reports and ROV/PRN 

## 2016-01-17 ENCOUNTER — Ambulatory Visit (INDEPENDENT_AMBULATORY_CARE_PROVIDER_SITE_OTHER): Payer: Medicare Other | Admitting: *Deleted

## 2016-01-17 DIAGNOSIS — R55 Syncope and collapse: Secondary | ICD-10-CM

## 2016-01-17 NOTE — Progress Notes (Signed)
[facility] Summary Report / Loop Recorder 

## 2016-02-12 LAB — CUP PACEART REMOTE DEVICE CHECK
Implantable Pulse Generator Implant Date: 20151231
MDC IDC SESS DTM: 20171022023917

## 2016-02-12 NOTE — Progress Notes (Signed)
Carelink summary report received. Battery status OK. Normal device function. No new symptom episodes, tachy episodes, brady, or pause episodes. No new AF episodes. Monthly summary reports and ROV/PRN 

## 2016-02-14 ENCOUNTER — Ambulatory Visit (INDEPENDENT_AMBULATORY_CARE_PROVIDER_SITE_OTHER): Payer: Medicare Other | Admitting: *Deleted

## 2016-02-14 DIAGNOSIS — R55 Syncope and collapse: Secondary | ICD-10-CM | POA: Diagnosis not present

## 2016-02-15 NOTE — Progress Notes (Signed)
[facility] Summary Report / Loop Recorder 

## 2016-03-15 ENCOUNTER — Ambulatory Visit (INDEPENDENT_AMBULATORY_CARE_PROVIDER_SITE_OTHER): Payer: Medicare Other | Admitting: *Deleted

## 2016-03-15 DIAGNOSIS — R55 Syncope and collapse: Secondary | ICD-10-CM | POA: Diagnosis not present

## 2016-03-16 NOTE — Progress Notes (Signed)
[facility] Summary Report / Loop Recorder 

## 2016-03-28 LAB — CUP PACEART REMOTE DEVICE CHECK
Date Time Interrogation Session: 20171121041021
MDC IDC PG IMPLANT DT: 20151231

## 2016-04-11 DIAGNOSIS — Z79899 Other long term (current) drug therapy: Secondary | ICD-10-CM | POA: Diagnosis not present

## 2016-04-11 DIAGNOSIS — G40909 Epilepsy, unspecified, not intractable, without status epilepticus: Secondary | ICD-10-CM | POA: Diagnosis not present

## 2016-04-11 DIAGNOSIS — E119 Type 2 diabetes mellitus without complications: Secondary | ICD-10-CM | POA: Diagnosis not present

## 2016-04-11 DIAGNOSIS — I10 Essential (primary) hypertension: Secondary | ICD-10-CM | POA: Diagnosis not present

## 2016-04-11 DIAGNOSIS — D509 Iron deficiency anemia, unspecified: Secondary | ICD-10-CM | POA: Diagnosis not present

## 2016-04-11 DIAGNOSIS — I2692 Saddle embolus of pulmonary artery without acute cor pulmonale: Secondary | ICD-10-CM | POA: Diagnosis not present

## 2016-04-11 DIAGNOSIS — E7800 Pure hypercholesterolemia, unspecified: Secondary | ICD-10-CM | POA: Diagnosis not present

## 2016-04-11 DIAGNOSIS — G629 Polyneuropathy, unspecified: Secondary | ICD-10-CM | POA: Diagnosis not present

## 2016-04-11 DIAGNOSIS — Z6828 Body mass index (BMI) 28.0-28.9, adult: Secondary | ICD-10-CM | POA: Diagnosis not present

## 2016-04-14 ENCOUNTER — Ambulatory Visit (INDEPENDENT_AMBULATORY_CARE_PROVIDER_SITE_OTHER): Payer: Medicare Other | Admitting: *Deleted

## 2016-04-14 DIAGNOSIS — R55 Syncope and collapse: Secondary | ICD-10-CM | POA: Diagnosis not present

## 2016-04-17 NOTE — Progress Notes (Signed)
[facility] Summary Report / Loop Recorder 

## 2016-04-27 DIAGNOSIS — Z79899 Other long term (current) drug therapy: Secondary | ICD-10-CM | POA: Diagnosis not present

## 2016-05-02 LAB — CUP PACEART REMOTE DEVICE CHECK
Date Time Interrogation Session: 20171221044313
MDC IDC PG IMPLANT DT: 20151231

## 2016-05-15 ENCOUNTER — Ambulatory Visit (INDEPENDENT_AMBULATORY_CARE_PROVIDER_SITE_OTHER): Payer: Medicare Other | Admitting: *Deleted

## 2016-05-15 DIAGNOSIS — R55 Syncope and collapse: Secondary | ICD-10-CM

## 2016-05-15 NOTE — Progress Notes (Signed)
[facility] Summary Report / Loop Recorder 

## 2016-05-18 LAB — CUP PACEART REMOTE DEVICE CHECK
Implantable Pulse Generator Implant Date: 20151231
MDC IDC SESS DTM: 20180120051318

## 2016-05-29 ENCOUNTER — Other Ambulatory Visit: Payer: Self-pay | Admitting: Cardiovascular Disease

## 2016-06-06 LAB — CUP PACEART REMOTE DEVICE CHECK
Date Time Interrogation Session: 20180219050823
MDC IDC PG IMPLANT DT: 20151231

## 2016-06-07 ENCOUNTER — Other Ambulatory Visit: Payer: Self-pay | Admitting: Internal Medicine

## 2016-06-14 ENCOUNTER — Ambulatory Visit (INDEPENDENT_AMBULATORY_CARE_PROVIDER_SITE_OTHER): Payer: Medicare Other | Admitting: *Deleted

## 2016-06-14 DIAGNOSIS — R55 Syncope and collapse: Secondary | ICD-10-CM

## 2016-06-14 NOTE — Progress Notes (Signed)
[facility] Summary Report / Loop Recorder 

## 2016-06-26 ENCOUNTER — Telehealth: Payer: Self-pay | Admitting: *Deleted

## 2016-06-26 LAB — CUP PACEART REMOTE DEVICE CHECK
Date Time Interrogation Session: 20180321053809
Implantable Pulse Generator Implant Date: 20151231

## 2016-06-26 NOTE — Telephone Encounter (Signed)
Spoke with patient, requested that he send a manual LINQ transmission for review.  Patient states that he is not at home right now and requests that I try back tomorrow.  Advised I will call back tomorrow afternoon to assist him.  Patient appreciative and denies additional questions or concerns at this time.  Need to review tachy episode ECG that did not transmit with 06/25/16 automatic transmission.

## 2016-06-28 NOTE — Telephone Encounter (Signed)
Manual transmission received, ECGs appear ST/SVT.

## 2016-06-28 NOTE — Telephone Encounter (Signed)
Spoke with patient, requested that he send a manual transmission for review of tachy episode. Manual transmission sent successfully. Advised patient I will review transmission and will call back if anything further is recommended. Patient verbalized understanding.

## 2016-07-04 ENCOUNTER — Encounter: Payer: Self-pay | Admitting: Cardiovascular Disease

## 2016-07-14 ENCOUNTER — Ambulatory Visit (INDEPENDENT_AMBULATORY_CARE_PROVIDER_SITE_OTHER): Payer: Medicare Other | Admitting: *Deleted

## 2016-07-14 DIAGNOSIS — R55 Syncope and collapse: Secondary | ICD-10-CM

## 2016-07-14 NOTE — Progress Notes (Signed)
[facility] Summary Report / Loop Recorder 

## 2016-07-20 LAB — CUP PACEART REMOTE DEVICE CHECK
Date Time Interrogation Session: 20180420053826
Implantable Pulse Generator Implant Date: 20151231

## 2016-08-04 DIAGNOSIS — H52223 Regular astigmatism, bilateral: Secondary | ICD-10-CM | POA: Diagnosis not present

## 2016-08-04 DIAGNOSIS — H2513 Age-related nuclear cataract, bilateral: Secondary | ICD-10-CM | POA: Diagnosis not present

## 2016-08-04 DIAGNOSIS — H5211 Myopia, right eye: Secondary | ICD-10-CM | POA: Diagnosis not present

## 2016-08-04 DIAGNOSIS — H524 Presbyopia: Secondary | ICD-10-CM | POA: Diagnosis not present

## 2016-08-04 DIAGNOSIS — H5202 Hypermetropia, left eye: Secondary | ICD-10-CM | POA: Diagnosis not present

## 2016-08-14 ENCOUNTER — Ambulatory Visit (INDEPENDENT_AMBULATORY_CARE_PROVIDER_SITE_OTHER): Payer: Medicare Other | Admitting: *Deleted

## 2016-08-14 DIAGNOSIS — R55 Syncope and collapse: Secondary | ICD-10-CM

## 2016-08-15 NOTE — Progress Notes (Signed)
[facility] Summary Report / Loop Recorder 

## 2016-08-16 DIAGNOSIS — H25811 Combined forms of age-related cataract, right eye: Secondary | ICD-10-CM | POA: Diagnosis not present

## 2016-08-16 DIAGNOSIS — H25812 Combined forms of age-related cataract, left eye: Secondary | ICD-10-CM | POA: Diagnosis not present

## 2016-08-16 DIAGNOSIS — E119 Type 2 diabetes mellitus without complications: Secondary | ICD-10-CM | POA: Diagnosis not present

## 2016-08-17 LAB — CUP PACEART REMOTE DEVICE CHECK
Implantable Pulse Generator Implant Date: 20151231
MDC IDC SESS DTM: 20180520060805

## 2016-08-18 DIAGNOSIS — Z79899 Other long term (current) drug therapy: Secondary | ICD-10-CM | POA: Diagnosis not present

## 2016-08-18 DIAGNOSIS — I10 Essential (primary) hypertension: Secondary | ICD-10-CM | POA: Diagnosis not present

## 2016-08-18 DIAGNOSIS — E119 Type 2 diabetes mellitus without complications: Secondary | ICD-10-CM | POA: Diagnosis not present

## 2016-08-18 DIAGNOSIS — H269 Unspecified cataract: Secondary | ICD-10-CM | POA: Diagnosis not present

## 2016-08-18 DIAGNOSIS — I2692 Saddle embolus of pulmonary artery without acute cor pulmonale: Secondary | ICD-10-CM | POA: Diagnosis not present

## 2016-08-18 DIAGNOSIS — E7800 Pure hypercholesterolemia, unspecified: Secondary | ICD-10-CM | POA: Diagnosis not present

## 2016-08-18 DIAGNOSIS — L309 Dermatitis, unspecified: Secondary | ICD-10-CM | POA: Diagnosis not present

## 2016-09-06 ENCOUNTER — Other Ambulatory Visit: Payer: Self-pay | Admitting: Internal Medicine

## 2016-09-07 DIAGNOSIS — H2511 Age-related nuclear cataract, right eye: Secondary | ICD-10-CM | POA: Diagnosis not present

## 2016-09-07 DIAGNOSIS — H25811 Combined forms of age-related cataract, right eye: Secondary | ICD-10-CM | POA: Diagnosis not present

## 2016-09-12 ENCOUNTER — Ambulatory Visit (INDEPENDENT_AMBULATORY_CARE_PROVIDER_SITE_OTHER): Payer: Medicare Other | Admitting: *Deleted

## 2016-09-12 DIAGNOSIS — R55 Syncope and collapse: Secondary | ICD-10-CM

## 2016-09-12 NOTE — Progress Notes (Signed)
[facility] Summary Report / Loop Recorder 

## 2016-09-20 LAB — CUP PACEART REMOTE DEVICE CHECK
Implantable Pulse Generator Implant Date: 20151231
MDC IDC SESS DTM: 20180619063553

## 2016-10-04 ENCOUNTER — Emergency Department (HOSPITAL_COMMUNITY)
Admission: EM | Admit: 2016-10-04 | Discharge: 2016-10-04 | Disposition: A | Discharge status: Home / Self Care | Payer: Medicare Other | Attending: Emergency Medicine | Admitting: Emergency Medicine

## 2016-10-04 ENCOUNTER — Encounter (HOSPITAL_COMMUNITY): Payer: Self-pay | Admitting: Emergency Medicine

## 2016-10-04 ENCOUNTER — Emergency Department (HOSPITAL_COMMUNITY): Payer: Medicare Other

## 2016-10-04 DIAGNOSIS — Z79899 Other long term (current) drug therapy: Secondary | ICD-10-CM | POA: Insufficient documentation

## 2016-10-04 DIAGNOSIS — Z8546 Personal history of malignant neoplasm of prostate: Secondary | ICD-10-CM | POA: Insufficient documentation

## 2016-10-04 DIAGNOSIS — J9811 Atelectasis: Secondary | ICD-10-CM | POA: Diagnosis not present

## 2016-10-04 DIAGNOSIS — G40901 Epilepsy, unspecified, not intractable, with status epilepticus: Secondary | ICD-10-CM | POA: Diagnosis not present

## 2016-10-04 DIAGNOSIS — E119 Type 2 diabetes mellitus without complications: Secondary | ICD-10-CM | POA: Insufficient documentation

## 2016-10-04 DIAGNOSIS — Z87891 Personal history of nicotine dependence: Secondary | ICD-10-CM | POA: Diagnosis not present

## 2016-10-04 DIAGNOSIS — R569 Unspecified convulsions: Secondary | ICD-10-CM | POA: Diagnosis not present

## 2016-10-04 DIAGNOSIS — I10 Essential (primary) hypertension: Secondary | ICD-10-CM | POA: Insufficient documentation

## 2016-10-04 LAB — CBC WITH DIFFERENTIAL/PLATELET
BASOS PCT: 0 %
Basophils Absolute: 0 10*3/uL (ref 0.0–0.1)
EOS ABS: 0 10*3/uL (ref 0.0–0.7)
Eosinophils Relative: 0 %
HCT: 33.5 % — ABNORMAL LOW (ref 39.0–52.0)
Hemoglobin: 10.5 g/dL — ABNORMAL LOW (ref 13.0–17.0)
Lymphocytes Relative: 9 %
Lymphs Abs: 0.6 10*3/uL — ABNORMAL LOW (ref 0.7–4.0)
MCH: 26.8 pg (ref 26.0–34.0)
MCHC: 31.3 g/dL (ref 30.0–36.0)
MCV: 85.5 fL (ref 78.0–100.0)
MONO ABS: 0.6 10*3/uL (ref 0.1–1.0)
MONOS PCT: 8 %
NEUTROS PCT: 83 %
Neutro Abs: 5.5 10*3/uL (ref 1.7–7.7)
Platelets: 278 10*3/uL (ref 150–400)
RBC: 3.92 MIL/uL — ABNORMAL LOW (ref 4.22–5.81)
RDW: 14.8 % (ref 11.5–15.5)
WBC: 6.7 10*3/uL (ref 4.0–10.5)

## 2016-10-04 LAB — COMPREHENSIVE METABOLIC PANEL
ALBUMIN: 3.8 g/dL (ref 3.5–5.0)
ALT: 9 U/L — ABNORMAL LOW (ref 17–63)
AST: 30 U/L (ref 15–41)
Alkaline Phosphatase: 92 U/L (ref 38–126)
Anion gap: 9 (ref 5–15)
BUN: 11 mg/dL (ref 6–20)
CO2: 20 mmol/L — AB (ref 22–32)
Calcium: 8.5 mg/dL — ABNORMAL LOW (ref 8.9–10.3)
Chloride: 106 mmol/L (ref 101–111)
Creatinine, Ser: 0.95 mg/dL (ref 0.61–1.24)
GFR calc Af Amer: >60 mL/min (ref >60)
GFR calc non Af Amer: >60 mL/min (ref >60)
GLUCOSE: 196 mg/dL — AB (ref 65–99)
POTASSIUM: 4.7 mmol/L (ref 3.5–5.1)
SODIUM: 135 mmol/L (ref 135–145)
Total Bilirubin: 0.2 mg/dL — ABNORMAL LOW (ref 0.3–1.2)
Total Protein: 7.6 g/dL (ref 6.5–8.1)

## 2016-10-04 MED ORDER — PHENYTOIN SODIUM EXTENDED 100 MG PO CAPS: ORAL_CAPSULE | ORAL | 0 refills | Status: DC

## 2016-10-04 MED ORDER — LORAZEPAM 2 MG/ML IJ SOLN
1.0000 mg | Freq: Once | INTRAMUSCULAR | Status: AC
  Administered 2016-10-04: 1 mg via INTRAVENOUS

## 2016-10-04 MED ORDER — SODIUM CHLORIDE 0.9 % IV SOLN
1000.0000 mg | Freq: Once | INTRAVENOUS | Status: AC
  Administered 2016-10-04: 1000 mg via INTRAVENOUS
  Filled 2016-10-04: qty 20

## 2016-10-04 MED ORDER — PHENYTOIN SODIUM EXTENDED 30 MG PO CAPS: 100.0000 mg | ORAL_CAPSULE | Freq: Every day | ORAL | Status: DC

## 2016-10-04 MED ORDER — LORAZEPAM 2 MG/ML IJ SOLN
INTRAMUSCULAR | Status: AC
  Administered 2016-10-04: 1 mg via INTRAVENOUS
  Filled 2016-10-04: qty 1

## 2016-10-04 MED ORDER — ACETAMINOPHEN 325 MG PO TABS
650.0000 mg | ORAL_TABLET | Freq: Once | ORAL | Status: AC
  Administered 2016-10-04: 650 mg via ORAL
  Filled 2016-10-04: qty 2

## 2016-10-04 NOTE — ED Notes (Signed)
Pharmacy contacted by RN to check status of IV med

## 2016-10-04 NOTE — ED Notes (Addendum)
Pt became tachycardic is the 140s for about 90 seconds and Pt became unable to respond to nurse or to make eye contact.  EDP notified, new orders received and given.  O2 began at 2L via [place].   Pt became alert & oriented again after about 5 minutes

## 2016-10-04 NOTE — ED Triage Notes (Signed)
Pt lives at home but was found today in his car following a grand mal seizure that lasted about 2 minutes.  Pt is post ictal on arrival to [facility].  Pt has Hx of seizures.  Pt is A&O x 2.

## 2016-10-04 NOTE — ED Notes (Signed)
Bed: WA04 Expected date:  Expected time:  Means of arrival:  Comments: EMS/h/a

## 2016-10-04 NOTE — Discharge Instructions (Signed)
Follow up with your primary care physician

## 2016-10-04 NOTE — ED Notes (Signed)
RN contacted pharmacy to check on IV med again.

## 2016-10-04 NOTE — ED Provider Notes (Signed)
[facility] DEPT Provider Note   CSN: [id number] Arrival date & time: 10/04/16  1102     History   Chief Complaint Chief Complaint  Patient presents with  . Seizures    HPI [name] is a 82 y.o. male. She complains seizure, stopped taking medications.  HPI 82 year old male. He lives alone. Had witnessed generalized seizure today. Has history of seizure disorder. States he stopped his medicines at the end of June because he ran out of them and states "couldn't get them refilled.  Upon arrival he was described as postictal period of my exam, he is awake and alert. Shortly after my exam he had an episode where his mental status drifted off into episode of unresponsiveness with right-sided gaze. This has resolved and is back to baseline.  Had a bite to his tongue from his single remaining anterior tooth with first episode.  Past Medical History:  Diagnosis Date  . Cancer [facility])    Prostate  . Cervicalgia   . Essential hypertension, benign   . History of loop recorder 07/14/2014  . Impotence of organic origin   . Pulmonary embolism ([place])   . Pure hypercholesterolemia   . Seizures ([place])   . Type II or unspecified type diabetes mellitus without mention of complication, not stated as uncontrolled     Patient Active Problem List   Diagnosis Date Noted  . Acute encephalopathy 05/17/2015  . Pyrexia   . UTI (lower urinary tract infection) 05/16/2015  . Syncope 07/14/2014  . History of loop recorder 07/14/2014  . Chronic anticoagulation 03/26/2014  . Dyslipidemia 03/24/2014  . Hx pulmonary embolism 10/25/2013  . HTN (hypertension) 10/25/2013  . Seizures ([place]) 09/10/2012    Past Surgical History:  Procedure Laterality Date  . LOOP RECORDER IMPLANT N/A 03/26/2014   Procedure: LOOP RECORDER IMPLANT;  Surgeon: Sanda Klein, MD;  Location: [facility] CATH LAB;  Service: Cardiovascular;  Laterality: N/A;       Home Medications    Prior to Admission medications   Medication Sig  Start Date End Date Taking? Authorizing Provider  edoxaban (SAVAYSA) 60 MG TABS tablet Take 60 mg by mouth daily. Patient taking differently: Take 60 mg by mouth at bedtime.  03/26/14   Burns, Arloa Koh, MD  lisinopril (PRINIVIL,ZESTRIL) 30 MG tablet Take 30 mg by mouth daily.    [provider]  phenytoin (DILANTIN) 100 MG ER capsule Take by mouth 3 (three) times daily. Take 1 tablet (100 mg total) by mouth every morning with a 30 mg capsule. Take 3 tablets (300 mg total) by mouth every evening.    [provider]  phenytoin (DILANTIN) 30 MG ER capsule Take 30 mg by mouth daily. with 100 mg capsule in the morning.    [provider]  pravastatin (PRAVACHOL) 40 MG tablet Take 40 mg by mouth at bedtime.    [provider]    Family History Family History  Problem Relation Age of Onset  . Cancer Mother   . Cancer Father     Social History Social History  Substance Use Topics  . Smoking status: Former Research scientist (life sciences)  . Smokeless tobacco: Never Used  . Alcohol use No     Allergies   Patient has no known allergies.   Review of Systems Review of Systems  Constitutional: Negative for appetite change, chills, diaphoresis, fatigue and fever.  HENT: Negative for mouth sores, sore throat and trouble swallowing.   Eyes: Negative for visual disturbance.  Respiratory: Negative for cough, chest  tightness, shortness of breath and wheezing.   Cardiovascular: Negative for chest pain.  Gastrointestinal: Negative for abdominal distention, abdominal pain, diarrhea, nausea and vomiting.  Endocrine: Negative for polydipsia, polyphagia and polyuria.  Genitourinary: Negative for dysuria, frequency and hematuria.  Musculoskeletal: Negative for gait problem.  Skin: Negative for color change, pallor and rash.  Neurological: Positive for seizures. Negative for dizziness, syncope, light-headedness and headaches.  Hematological: Does not bruise/bleed easily.    Psychiatric/Behavioral: Negative for behavioral problems and confusion.     Physical Exam Updated Vital Signs BP (!) 160/94 (BP Location: Left Arm)   Pulse 97   Temp 99 F (37.2 C) (Oral)   Resp 16   SpO2 96%   Physical Exam  Constitutional: He is oriented to person, place, and time. He appears well-developed and well-nourished. No distress.  HENT:  Head: Normocephalic.  Bite/contusion to left anterior tongue  Eyes: Conjunctivae are normal. Pupils are equal, round, and reactive to light. No scleral icterus.  Neck: Normal range of motion. Neck supple. No thyromegaly present.  Cardiovascular: Normal rate and regular rhythm.  Exam reveals no gallop and no friction rub.   No murmur heard. Pulmonary/Chest: Effort normal and breath sounds normal. No respiratory distress. He has no wheezes. He has no rales.  Abdominal: Soft. Bowel sounds are normal. He exhibits no distension. There is no tenderness. There is no rebound.  Musculoskeletal: Normal range of motion.  Neurological: He is alert and oriented to person, place, and time.  Skin: Skin is warm and dry. No rash noted.  Psychiatric: He has a normal mood and affect. His behavior is normal.     ED Treatments / Results  Labs (all labs ordered are listed, but only abnormal results are displayed) Labs Reviewed  CBC WITH DIFFERENTIAL/PLATELET - Abnormal; Notable for the following:       Result Value   RBC 3.92 (*)    Hemoglobin 10.5 (*)    HCT 33.5 (*)    Lymphs Abs 0.6 (*)    All other components within normal limits  COMPREHENSIVE METABOLIC PANEL - Abnormal; Notable for the following:    CO2 20 (*)    Glucose, Bld 196 (*)    Calcium 8.5 (*)    ALT 9 (*)    Total Bilirubin 0.2 (*)    All other components within normal limits    EKG  EKG Interpretation  Date/Time:  Wednesday October 04 2016 11:15:05 EDT Ventricular Rate:  94 PR Interval:    QRS Duration: 81 QT Interval:  376 QTC Calculation: 471 R Axis:   -2 Text  Interpretation:  Sinus rhythm Multiple ventricular premature complexes Prolonged PR interval Probable anteroseptal infarct, old Borderline T abnormalities, inferior leads Confirmed by Tanna Furry [phone]) on 10/04/2016 1:23:53 PM       Radiology Ct Head Wo Contrast  Result Date: 10/04/2016 CLINICAL DATA:  Grand mal seizure EXAM: CT HEAD WITHOUT CONTRAST TECHNIQUE: Contiguous axial images were obtained from the base of the skull through the vertex without intravenous contrast. COMPARISON:  May 16, 2015 FINDINGS: Brain: Mild diffuse atrophy is stable. There is somewhat greater cerebellar atrophy, likewise stable. There is no intracranial mass, hemorrhage, extra-axial fluid collection, or midline shift. There is patchy small vessel disease in the centra semiovale bilaterally. No acute infarct evident. Vascular: No hyperdense vessel. There is calcification in the carotid siphon regions bilaterally. Skull: The bony calvarium appears intact. Sinuses/Orbits: There is opacification of most of the left maxillary antrum. There is mucosal thickening in  multiple ethmoid air cells. There is opacification of a posterior left ethmoid air cell. Other visualized paranasal sinuses are clear. The patient appears to have had cataract removal on the right since prior CT. Orbits otherwise appear symmetric and unremarkable bilaterally. Other: Mastoid air cells are clear. IMPRESSION: Stable atrophy with atrophy somewhat greater in the cerebellum elsewhere. No intracranial mass, hemorrhage, or extra-axial fluid collection. Mild small vessel disease without acute infarct evident. Foci of arteriovascular calcification noted. Areas of paranasal sinus disease noted, most notably in the left maxillary antrum. Question cataract removal on the right. Electronically Signed   By: Lowella Grip III M.D.   On: 10/04/2016 13:16   Dg Chest Port 1 View  Result Date: 10/04/2016 CLINICAL DATA:  Seizure, desaturation EXAM: PORTABLE CHEST  1 VIEW COMPARISON:  05/16/2015 FINDINGS: Hypoventilation with mild bibasilar atelectasis. Negative for pneumonia. Negative for heart failure or effusion. Cardiac loop recorder noted. IMPRESSION: Hypoventilation with mild bibasilar atelectasis. Electronically Signed   By: Franchot Gallo M.D.   On: 10/04/2016 11:45    Procedures Procedures (including critical care time)  Medications Ordered in ED Medications  fosPHENYtoin (CEREBYX) 1,000 mg PE in sodium chloride 0.9 % 50 mL IVPB (1,000 mg PE Intravenous New Bag/Given 10/04/16 1502)  acetaminophen (TYLENOL) tablet 650 mg (650 mg Oral Given 10/04/16 1124)  LORazepam (ATIVAN) injection 1 mg (1 mg Intravenous Given 10/04/16 1135)     Initial Impression / Assessment and Plan / ED Course  I have reviewed the triage vital signs and the nursing notes.  Pertinent labs & imaging results that were available during my care of the patient were reviewed by me and considered in my medical decision making (see chart for details).   with second episode, and recent at the coagulation use will plan CT scan of head. Although not hypoxic at baseline he did desaturate procedure. No obvious emesis. Will give Ativan and Dilantin load.  Final Clinical Impressions(s) / ED Diagnoses   Final diagnoses:  Seizure [facility])    He has ambulate. Has completed Dilantin load. Plan discharge home. We'll prescribe for him to resume his Dilantin.  New Prescriptions New Prescriptions   No medications on file     Tanna Furry, MD 10/04/16 [phone]

## 2016-10-06 DIAGNOSIS — Z6827 Body mass index (BMI) 27.0-27.9, adult: Secondary | ICD-10-CM | POA: Diagnosis not present

## 2016-10-06 DIAGNOSIS — G40909 Epilepsy, unspecified, not intractable, without status epilepticus: Secondary | ICD-10-CM | POA: Diagnosis not present

## 2016-10-06 DIAGNOSIS — Z024 Encounter for examination for driving license: Secondary | ICD-10-CM | POA: Diagnosis not present

## 2016-10-12 ENCOUNTER — Ambulatory Visit (INDEPENDENT_AMBULATORY_CARE_PROVIDER_SITE_OTHER): Payer: Medicare Other | Admitting: *Deleted

## 2016-10-12 DIAGNOSIS — R55 Syncope and collapse: Secondary | ICD-10-CM

## 2016-10-17 NOTE — Progress Notes (Signed)
[facility] Summary Report / Loop Recorder 

## 2016-10-26 DIAGNOSIS — Z136 Encounter for screening for cardiovascular disorders: Secondary | ICD-10-CM | POA: Diagnosis not present

## 2016-10-26 DIAGNOSIS — Z125 Encounter for screening for malignant neoplasm of prostate: Secondary | ICD-10-CM | POA: Diagnosis not present

## 2016-10-26 DIAGNOSIS — Z9181 History of falling: Secondary | ICD-10-CM | POA: Diagnosis not present

## 2016-10-26 DIAGNOSIS — Z0000 Encounter for general adult medical examination without abnormal findings: Secondary | ICD-10-CM | POA: Diagnosis not present

## 2016-10-26 DIAGNOSIS — E785 Hyperlipidemia, unspecified: Secondary | ICD-10-CM | POA: Diagnosis not present

## 2016-10-26 DIAGNOSIS — Z1389 Encounter for screening for other disorder: Secondary | ICD-10-CM | POA: Diagnosis not present

## 2016-10-27 LAB — CUP PACEART REMOTE DEVICE CHECK
MDC IDC PG IMPLANT DT: 20151231
MDC IDC SESS DTM: 20180719223934

## 2016-11-01 ENCOUNTER — Other Ambulatory Visit: Payer: Self-pay | Admitting: Internal Medicine

## 2016-11-08 DIAGNOSIS — Z6827 Body mass index (BMI) 27.0-27.9, adult: Secondary | ICD-10-CM | POA: Diagnosis not present

## 2016-11-08 DIAGNOSIS — I10 Essential (primary) hypertension: Secondary | ICD-10-CM | POA: Diagnosis not present

## 2016-11-08 DIAGNOSIS — R7309 Other abnormal glucose: Secondary | ICD-10-CM | POA: Diagnosis not present

## 2016-11-08 DIAGNOSIS — E663 Overweight: Secondary | ICD-10-CM | POA: Diagnosis not present

## 2016-11-08 DIAGNOSIS — G40909 Epilepsy, unspecified, not intractable, without status epilepticus: Secondary | ICD-10-CM | POA: Diagnosis not present

## 2016-11-08 DIAGNOSIS — E7800 Pure hypercholesterolemia, unspecified: Secondary | ICD-10-CM | POA: Diagnosis not present

## 2016-11-08 DIAGNOSIS — I2692 Saddle embolus of pulmonary artery without acute cor pulmonale: Secondary | ICD-10-CM | POA: Diagnosis not present

## 2016-11-08 DIAGNOSIS — E119 Type 2 diabetes mellitus without complications: Secondary | ICD-10-CM | POA: Diagnosis not present

## 2016-11-08 DIAGNOSIS — D509 Iron deficiency anemia, unspecified: Secondary | ICD-10-CM | POA: Diagnosis not present

## 2016-11-08 DIAGNOSIS — E118 Type 2 diabetes mellitus with unspecified complications: Secondary | ICD-10-CM | POA: Diagnosis not present

## 2016-11-13 ENCOUNTER — Ambulatory Visit (INDEPENDENT_AMBULATORY_CARE_PROVIDER_SITE_OTHER): Payer: Medicare Other | Admitting: *Deleted

## 2016-11-13 DIAGNOSIS — R55 Syncope and collapse: Secondary | ICD-10-CM

## 2016-11-14 NOTE — Progress Notes (Signed)
[facility] Summary Report / Loop Recorder 

## 2016-11-18 LAB — CUP PACEART REMOTE DEVICE CHECK
Date Time Interrogation Session: 20180818224438
MDC IDC PG IMPLANT DT: 20151231

## 2016-11-18 NOTE — Progress Notes (Signed)
Carelink summary report received. Battery status OK. Normal device function. No new symptom episodes, brady, or pause episodes. No new AF episodes. 1 tachy- ECG appears ST/SVT, no symptoms reported per EPIC. Monthly summary reports and ROV/PRN

## 2016-11-29 DIAGNOSIS — H35351 Cystoid macular degeneration, right eye: Secondary | ICD-10-CM | POA: Diagnosis not present

## 2016-12-11 ENCOUNTER — Ambulatory Visit (INDEPENDENT_AMBULATORY_CARE_PROVIDER_SITE_OTHER): Payer: Medicare Other | Admitting: *Deleted

## 2016-12-11 DIAGNOSIS — R55 Syncope and collapse: Secondary | ICD-10-CM

## 2016-12-12 LAB — CUP PACEART REMOTE DEVICE CHECK
Implantable Pulse Generator Implant Date: 20151231
MDC IDC SESS DTM: 20180917233849

## 2016-12-12 NOTE — Progress Notes (Signed)
[facility] Summary Report / Loop Recorder 

## 2017-01-11 ENCOUNTER — Ambulatory Visit (INDEPENDENT_AMBULATORY_CARE_PROVIDER_SITE_OTHER): Payer: Medicare Other | Admitting: *Deleted

## 2017-01-11 DIAGNOSIS — R55 Syncope and collapse: Secondary | ICD-10-CM | POA: Diagnosis not present

## 2017-01-12 LAB — CUP PACEART REMOTE DEVICE CHECK
Date Time Interrogation Session: 20181017234052
MDC IDC PG IMPLANT DT: 20151231

## 2017-01-12 NOTE — Progress Notes (Signed)
[facility] Summary Report / Loop Recorder 

## 2017-02-09 ENCOUNTER — Ambulatory Visit (INDEPENDENT_AMBULATORY_CARE_PROVIDER_SITE_OTHER): Payer: Medicare Other | Admitting: *Deleted

## 2017-02-09 DIAGNOSIS — R55 Syncope and collapse: Secondary | ICD-10-CM | POA: Diagnosis not present

## 2017-02-12 NOTE — Progress Notes (Signed)
[facility] Summary Report / Loop Recorder 

## 2017-02-23 DIAGNOSIS — E119 Type 2 diabetes mellitus without complications: Secondary | ICD-10-CM | POA: Diagnosis not present

## 2017-02-23 DIAGNOSIS — I2692 Saddle embolus of pulmonary artery without acute cor pulmonale: Secondary | ICD-10-CM | POA: Diagnosis not present

## 2017-02-23 DIAGNOSIS — G40909 Epilepsy, unspecified, not intractable, without status epilepticus: Secondary | ICD-10-CM | POA: Diagnosis not present

## 2017-02-23 DIAGNOSIS — E7800 Pure hypercholesterolemia, unspecified: Secondary | ICD-10-CM | POA: Diagnosis not present

## 2017-02-23 DIAGNOSIS — Z6827 Body mass index (BMI) 27.0-27.9, adult: Secondary | ICD-10-CM | POA: Diagnosis not present

## 2017-02-23 DIAGNOSIS — Z79899 Other long term (current) drug therapy: Secondary | ICD-10-CM | POA: Diagnosis not present

## 2017-02-23 DIAGNOSIS — I10 Essential (primary) hypertension: Secondary | ICD-10-CM | POA: Diagnosis not present

## 2017-02-26 LAB — CUP PACEART REMOTE DEVICE CHECK
Implantable Pulse Generator Implant Date: 20151231
MDC IDC SESS DTM: 20181117004000

## 2017-03-08 ENCOUNTER — Telehealth: Payer: Self-pay | Admitting: Cardiovascular Disease

## 2017-03-08 NOTE — Telephone Encounter (Signed)
Closed Encounter  °

## 2017-03-12 ENCOUNTER — Ambulatory Visit (INDEPENDENT_AMBULATORY_CARE_PROVIDER_SITE_OTHER): Payer: Medicare Other | Admitting: *Deleted

## 2017-03-12 DIAGNOSIS — R55 Syncope and collapse: Secondary | ICD-10-CM | POA: Diagnosis not present

## 2017-03-12 NOTE — Progress Notes (Signed)
[facility] Summary Report / Loop Recorder 

## 2017-03-22 LAB — CUP PACEART REMOTE DEVICE CHECK
Implantable Pulse Generator Implant Date: 20151231
MDC IDC SESS DTM: 20181217003902

## 2017-04-06 DIAGNOSIS — D509 Iron deficiency anemia, unspecified: Secondary | ICD-10-CM | POA: Diagnosis not present

## 2017-04-06 DIAGNOSIS — Z79899 Other long term (current) drug therapy: Secondary | ICD-10-CM | POA: Diagnosis not present

## 2017-04-06 DIAGNOSIS — G40909 Epilepsy, unspecified, not intractable, without status epilepticus: Secondary | ICD-10-CM | POA: Diagnosis not present

## 2017-04-06 DIAGNOSIS — Z6827 Body mass index (BMI) 27.0-27.9, adult: Secondary | ICD-10-CM | POA: Diagnosis not present

## 2017-04-06 DIAGNOSIS — I2692 Saddle embolus of pulmonary artery without acute cor pulmonale: Secondary | ICD-10-CM | POA: Diagnosis not present

## 2017-04-10 ENCOUNTER — Ambulatory Visit (INDEPENDENT_AMBULATORY_CARE_PROVIDER_SITE_OTHER): Payer: Medicare Other | Admitting: *Deleted

## 2017-04-10 DIAGNOSIS — R55 Syncope and collapse: Secondary | ICD-10-CM

## 2017-04-11 NOTE — Progress Notes (Signed)
[facility] Summary Report / Loop Recorder 

## 2017-04-20 LAB — CUP PACEART REMOTE DEVICE CHECK
Date Time Interrogation Session: 20190116010845
MDC IDC PG IMPLANT DT: 20151231

## 2017-04-27 DIAGNOSIS — Z1212 Encounter for screening for malignant neoplasm of rectum: Secondary | ICD-10-CM | POA: Diagnosis not present

## 2017-05-10 ENCOUNTER — Ambulatory Visit (INDEPENDENT_AMBULATORY_CARE_PROVIDER_SITE_OTHER): Payer: Medicare Other | Admitting: *Deleted

## 2017-05-10 DIAGNOSIS — R55 Syncope and collapse: Secondary | ICD-10-CM | POA: Diagnosis not present

## 2017-05-14 NOTE — Progress Notes (Signed)
[facility] Summary Report / Loop Recorder 

## 2017-06-05 ENCOUNTER — Telehealth: Payer: Self-pay | Admitting: *Deleted

## 2017-06-05 NOTE — Telephone Encounter (Signed)
Spoke with patient regarding tachy episode noted on LINQ from 06/04/17 at 1433 (unsure if device time accurate due to Daylight Savings).  ECG appears SVT, duration 12sec, median V rate 171bpm (see below).  Previous available tachy episodes have indicated gradual onset, median V rates 158-162bpm.    Patient reports he had a dizzy spell yesterday afternoon, but denies syncope.  No other symptoms.  He does not recall the exact time of the episode and did not use his symptom activator.  He is taking all medications as prescribed, including edoxaban and lisinopril.  Patient is agreeable to scheduling appointment with Dr. Sallyanne Kuster (overdue since 11/2016 per recall), and is aware a scheduler will be reaching out to him.  He is appreciative of call and denies additional questions or concerns at this time.  Routed to Dr. Sallyanne Kuster for any additional recommendations.  ECG also printed and placed in Dr. Langley Gauss folder for review.

## 2017-06-06 NOTE — Telephone Encounter (Signed)
Thank you. Appears to be PAT. Discuss management at appointment MCr

## 2017-06-07 LAB — CUP PACEART REMOTE DEVICE CHECK
Date Time Interrogation Session: 20190215013916
MDC IDC PG IMPLANT DT: 20151231

## 2017-06-11 ENCOUNTER — Other Ambulatory Visit: Payer: Self-pay | Admitting: Cardiovascular Disease

## 2017-06-12 ENCOUNTER — Ambulatory Visit (INDEPENDENT_AMBULATORY_CARE_PROVIDER_SITE_OTHER): Payer: Medicare Other | Admitting: *Deleted

## 2017-06-12 DIAGNOSIS — R55 Syncope and collapse: Secondary | ICD-10-CM | POA: Diagnosis not present

## 2017-06-13 NOTE — Progress Notes (Signed)
[facility] Summary Report / Loop Recorder 

## 2017-06-21 ENCOUNTER — Ambulatory Visit: Payer: Medicare Other | Admitting: Cardiovascular Disease

## 2017-06-21 DIAGNOSIS — R0989 Other specified symptoms and signs involving the circulatory and respiratory systems: Secondary | ICD-10-CM

## 2017-06-22 ENCOUNTER — Encounter: Payer: Self-pay | Admitting: *Deleted

## 2017-07-09 DIAGNOSIS — E7800 Pure hypercholesterolemia, unspecified: Secondary | ICD-10-CM | POA: Diagnosis not present

## 2017-07-09 DIAGNOSIS — E119 Type 2 diabetes mellitus without complications: Secondary | ICD-10-CM | POA: Diagnosis not present

## 2017-07-09 DIAGNOSIS — I2692 Saddle embolus of pulmonary artery without acute cor pulmonale: Secondary | ICD-10-CM | POA: Diagnosis not present

## 2017-07-09 DIAGNOSIS — Z1331 Encounter for screening for depression: Secondary | ICD-10-CM | POA: Diagnosis not present

## 2017-07-09 DIAGNOSIS — Z6827 Body mass index (BMI) 27.0-27.9, adult: Secondary | ICD-10-CM | POA: Diagnosis not present

## 2017-07-09 DIAGNOSIS — D509 Iron deficiency anemia, unspecified: Secondary | ICD-10-CM | POA: Diagnosis not present

## 2017-07-09 DIAGNOSIS — Z79899 Other long term (current) drug therapy: Secondary | ICD-10-CM | POA: Diagnosis not present

## 2017-07-09 DIAGNOSIS — G40909 Epilepsy, unspecified, not intractable, without status epilepticus: Secondary | ICD-10-CM | POA: Diagnosis not present

## 2017-07-09 DIAGNOSIS — I10 Essential (primary) hypertension: Secondary | ICD-10-CM | POA: Diagnosis not present

## 2017-07-16 ENCOUNTER — Ambulatory Visit (INDEPENDENT_AMBULATORY_CARE_PROVIDER_SITE_OTHER): Payer: Medicare Other | Admitting: *Deleted

## 2017-07-16 DIAGNOSIS — R55 Syncope and collapse: Secondary | ICD-10-CM

## 2017-07-16 NOTE — Progress Notes (Signed)
[facility] Summary Report / Loop Recorder 

## 2017-07-21 LAB — CUP PACEART REMOTE DEVICE CHECK
Implantable Pulse Generator Implant Date: 20151231
MDC IDC SESS DTM: 20190320020700

## 2017-07-24 ENCOUNTER — Telehealth: Payer: Self-pay | Admitting: Cardiology

## 2017-07-24 NOTE — Telephone Encounter (Signed)
Spoke w/ pt and requested that he send a manual transmission b/c his home monitor has not updated in at least 14 days.   

## 2017-08-02 ENCOUNTER — Encounter: Payer: Self-pay | Admitting: Cardiology

## 2017-08-09 DIAGNOSIS — D509 Iron deficiency anemia, unspecified: Secondary | ICD-10-CM | POA: Diagnosis not present

## 2017-08-13 LAB — CUP PACEART REMOTE DEVICE CHECK
Date Time Interrogation Session: 20190422034029
Implantable Pulse Generator Implant Date: 20151231

## 2017-08-15 ENCOUNTER — Encounter: Payer: Self-pay | Admitting: Cardiology

## 2017-08-16 ENCOUNTER — Encounter: Payer: Medicare Other | Admitting: *Deleted

## 2017-08-24 ENCOUNTER — Telehealth: Payer: Self-pay | Admitting: *Deleted

## 2017-08-24 NOTE — Telephone Encounter (Signed)
Spoke with patient regarding LINQ at [facility] as of 07/26/17.  Patient is agreeable to appointment with Dr. Sallyanne Kuster to discuss plan.  Also overdue for 27yrf/u per notes (no-show for 06/21/17 OV).  Will send message to scheduler to request appointment.  Patient is agreeable to receiving return kit for Carelink monitor.  [place] return kit ordered to confirmed home address.

## 2017-08-30 NOTE — Telephone Encounter (Signed)
Patient is scheduled to see Dr. Sallyanne Kuster on 09/03/17.

## 2017-09-03 ENCOUNTER — Encounter: Payer: Self-pay | Admitting: Cardiovascular Disease

## 2017-09-03 ENCOUNTER — Ambulatory Visit (INDEPENDENT_AMBULATORY_CARE_PROVIDER_SITE_OTHER): Payer: Medicare Other | Admitting: Cardiovascular Disease

## 2017-09-03 VITALS — BP 120/60 | HR 98 | Ht 71.0 in | Wt 188.0 lb

## 2017-09-03 DIAGNOSIS — Z4509 Encounter for adjustment and management of other cardiac device: Secondary | ICD-10-CM | POA: Diagnosis not present

## 2017-09-03 DIAGNOSIS — Z86711 Personal history of pulmonary embolism: Secondary | ICD-10-CM | POA: Diagnosis not present

## 2017-09-03 DIAGNOSIS — I10 Essential (primary) hypertension: Secondary | ICD-10-CM

## 2017-09-03 DIAGNOSIS — E782 Mixed hyperlipidemia: Secondary | ICD-10-CM

## 2017-09-03 NOTE — Progress Notes (Signed)
Cardiology Office Note    Date:  09/03/2017   ID:  [name], DOB [birth year], MRN [id number]  PCP:  Anthony Sake, MD  Cardiologist:   Sanda Klein, MD   Chief Complaint  Patient presents with  . Follow-up  . Headache    History of Present Illness:  [name] is a 83 y.o. male with a history of seizures and possible syncope of other etiology, for which he received a loop recorder almost 3 years ago.   During 3 years of follow-up the device has not shown any arrhythmia capable of causing syncope.  He has had a few episodes of atrial tachycardia, most of them consistent with sinus tachycardia although cannot exclude brief ectopic atrial tachycardia.    Additional medical problems include pulmonary embolism, hypertension and hyperlipidemia. He is compliant with anticoagulant, antihypertensive and lipid-lowering therapy.   The patient specifically denies any chest pain at rest exertion, dyspnea at rest or with exertion, orthopnea, paroxysmal nocturnal dyspnea, syncope, palpitations, focal neurological deficits, intermittent claudication, lower extremity edema, unexplained weight gain, cough, hemoptysis or wheezing.  Loop recorder is very close to RRT and we will stop monitoring.  Past Medical History:  Diagnosis Date  . Cancer [facility])    Prostate  . Cervicalgia   . Essential hypertension, benign   . History of loop recorder 07/14/2014  . Impotence of organic origin   . Pulmonary embolism ([place])   . Pure hypercholesterolemia   . Seizures ([place])   . Type II or unspecified type diabetes mellitus without mention of complication, not stated as uncontrolled     Past Surgical History:  Procedure Laterality Date  . LOOP RECORDER IMPLANT N/A 03/26/2014   Procedure: LOOP RECORDER IMPLANT;  Surgeon: Sanda Klein, MD;  Location: [facility] [place] CATH LAB;  Service: Cardiovascular;  Laterality: N/A;    Current Medications: Outpatient Medications Prior to Visit  Medication Sig Dispense Refill  .  edoxaban (SAVAYSA) 60 MG TABS tablet Take 60 mg by mouth daily. (Patient taking differently: Take 60 mg by mouth at bedtime. ) 30 tablet 11  . ferrous sulfate 325 (65 FE) MG EC tablet Take 1 tablet by mouth daily.  5  . lisinopril (PRINIVIL,ZESTRIL) 30 MG tablet Take 30 mg by mouth daily.    . phenytoin (DILANTIN) 100 MG ER capsule Dilantin total 130 milligrams every morning. 300 mg every afternoon 100 capsule 0  . phenytoin (DILANTIN) 30 MG ER capsule Take 3 capsules (90 mg total) by mouth daily. Dilantin dosage 130 every morning. 300 every afternoon 100 capsule o  . pravastatin (PRAVACHOL) 40 MG tablet Take 40 mg by mouth at bedtime.     No facility-administered medications prior to visit.      Allergies:   Patient has no known allergies.   Social History   Socioeconomic History  . Marital status: Single    Spouse name: Not on file  . Number of children: Not on file  . Years of education: 66  . Highest education level: Not on file  Occupational History  . Not on file  Social Needs  . Financial resource strain: Not on file  . Food insecurity:    Worry: Not on file    Inability: Not on file  . Transportation needs:    Medical: Not on file    Non-medical: Not on file  Tobacco Use  . Smoking status: Former Research scientist (life sciences)  . Smokeless tobacco: Never Used  Substance and Sexual Activity  . Alcohol use: No  .  Drug use: No  . Sexual activity: Not on file  Lifestyle  . Physical activity:    Days per week: Not on file    Minutes per session: Not on file  . Stress: Not on file  Relationships  . Social connections:    Talks on phone: Not on file    Gets together: Not on file    Attends religious service: Not on file    Active member of club or organization: Not on file    Attends meetings of clubs or organizations: Not on file    Relationship status: Not on file  Other Topics Concern  . Not on file  Social History Narrative   He lives with his brother, retired, driving, no smoking,  no drinking.     Family History:  The patient's family history includes Cancer in his father and mother.   ROS:   Please see the history of present illness.    ROS All other systems reviewed and are negative.   PHYSICAL EXAM:   VS:  BP 120/60 (BP Location: Left Arm, Patient Position: Sitting, Cuff Size: Normal)   Pulse 98   Ht 5\' 11"  (1.803 m)   Wt 188 lb (85.3 kg)   BMI 26.22 kg/m     General: Alert, oriented x3, no distress, appears lean and fit Head: no evidence of trauma, PERRL, EOMI, no exophtalmos or lid lag, no myxedema, no xanthelasma; normal ears, nose and oropharynx Neck: normal jugular venous pulsations and no hepatojugular reflux; brisk carotid pulses without delay and no carotid bruits Chest: clear to auscultation, no signs of consolidation by percussion or palpation, normal fremitus, symmetrical and full respiratory excursions.  Well-healed loop recorder site Cardiovascular: normal position and quality of the apical impulse, regular rhythm, normal first and second heart sounds, no murmurs, rubs or gallops Abdomen: no tenderness or distention, no masses by palpation, no abnormal pulsatility or arterial bruits, normal bowel sounds, no hepatosplenomegaly Extremities: no clubbing, cyanosis or edema; 2+ radial, ulnar and brachial pulses bilaterally; 2+ right femoral, posterior tibial and dorsalis pedis pulses; 2+ left femoral, posterior tibial and dorsalis pedis pulses; no subclavian or femoral bruits Neurological: grossly nonfocal Psych: Normal mood and affect   Wt Readings from Last 3 Encounters:  09/03/17 188 lb (85.3 kg)  12/23/15 185 lb 12.8 oz (84.3 kg)  05/16/15 187 lb 1 oz (84.9 kg)      Studies/Labs Reviewed:   EKG:  EKG is ordered today.  The ekg ordered today demonstrates normal sinus rhythm with a couple of PVCs, left ventricular secondary repolarization abnormalities Recent Labs: 10/04/2016: ALT 9; BUN 11; Creatinine, Ser 0.95; Hemoglobin 10.5; Platelets  278; Potassium 4.7; Sodium 135   Lipid Panel    Component Value Date/Time   CHOL 155 01/17/2011 1915   TRIG 63 01/17/2011 1915   HDL 50 01/17/2011 1915   CHOLHDL 3.1 01/17/2011 1915   VLDL 13 01/17/2011 1915   [place] 92 01/17/2011 1915   Labs from 11/08/2017 [facility] show: total cholesterol 205, triglycerides 101, HDL 73, LDL 112 Glucose 120, potassium 4.4, creatinine 0.92, normal liver function tests Hemoglobin 10.9  ASSESSMENT:    1. Encounter for loop recorder at end of battery life   2. Essential hypertension   3. Mixed hyperlipidemia   4. History of pulmonary embolism      PLAN:  In order of problems listed above:  1. ILR: 3 years of monitoring the device has not shown any meaningful.  Likely that  his episode of "syncope" was just an unusual presentation of a seizure.  He prefers to leave the device in place and not have it removed. 2. Hypertension: Controlled 3. Hyperlipidemia on statin: Target LDL less than 100 in the absence of known coronary or peripheral arterial disorders. 4. History of pulmonary embolism on chronic direct oral anticoagulant without bleeding problems.  Note mild anemia.  I do not have the erythrocyte indices available, so not sure if his iron deficiency pattern.  At this point he does not require routine cardiology follow-up, but will be delighted to see him back in clinic if there are new developments.   Medication Adjustments/Labs and Tests Ordered: Current medicines are reviewed at length with the patient today.  Concerns regarding medicines are outlined above.  Medication changes, Labs and Tests ordered today are listed in the Patient Instructions below. Patient Instructions  Dr Sallyanne Kuster recommends that you follow-up with him as needed.    Signed, Sanda Klein, MD  09/03/2017 1:29 PM    [facility] [place], [place], [place]  [address] Phone: [phone]; Fax: [phone]

## 2017-09-03 NOTE — Patient Instructions (Signed)
Dr Croitoru recommends that you follow-up with him as needed. 

## 2017-09-05 NOTE — Addendum Note (Signed)
Addended by: Margree Gimbel L on: 09/05/2017 01:36 PM   Modules accepted: Orders  

## 2017-09-14 DIAGNOSIS — D509 Iron deficiency anemia, unspecified: Secondary | ICD-10-CM | POA: Diagnosis not present

## 2017-10-17 ENCOUNTER — Other Ambulatory Visit: Payer: Self-pay

## 2017-10-31 DIAGNOSIS — Z6827 Body mass index (BMI) 27.0-27.9, adult: Secondary | ICD-10-CM | POA: Diagnosis not present

## 2017-10-31 DIAGNOSIS — G40909 Epilepsy, unspecified, not intractable, without status epilepticus: Secondary | ICD-10-CM | POA: Diagnosis not present

## 2017-10-31 DIAGNOSIS — Z79899 Other long term (current) drug therapy: Secondary | ICD-10-CM | POA: Diagnosis not present

## 2017-11-01 DIAGNOSIS — G40909 Epilepsy, unspecified, not intractable, without status epilepticus: Secondary | ICD-10-CM | POA: Diagnosis not present

## 2017-11-09 DIAGNOSIS — D509 Iron deficiency anemia, unspecified: Secondary | ICD-10-CM | POA: Diagnosis not present

## 2017-11-09 DIAGNOSIS — G40909 Epilepsy, unspecified, not intractable, without status epilepticus: Secondary | ICD-10-CM | POA: Diagnosis not present

## 2017-11-09 DIAGNOSIS — Z79899 Other long term (current) drug therapy: Secondary | ICD-10-CM | POA: Diagnosis not present

## 2017-11-09 DIAGNOSIS — Z9181 History of falling: Secondary | ICD-10-CM | POA: Diagnosis not present

## 2017-11-09 DIAGNOSIS — E7800 Pure hypercholesterolemia, unspecified: Secondary | ICD-10-CM | POA: Diagnosis not present

## 2017-11-09 DIAGNOSIS — Z1339 Encounter for screening examination for other mental health and behavioral disorders: Secondary | ICD-10-CM | POA: Diagnosis not present

## 2017-11-09 DIAGNOSIS — E119 Type 2 diabetes mellitus without complications: Secondary | ICD-10-CM | POA: Diagnosis not present

## 2017-11-09 DIAGNOSIS — I2692 Saddle embolus of pulmonary artery without acute cor pulmonale: Secondary | ICD-10-CM | POA: Diagnosis not present

## 2017-11-12 DIAGNOSIS — L989 Disorder of the skin and subcutaneous tissue, unspecified: Secondary | ICD-10-CM | POA: Diagnosis not present

## 2017-11-27 DIAGNOSIS — G40909 Epilepsy, unspecified, not intractable, without status epilepticus: Secondary | ICD-10-CM | POA: Diagnosis not present

## 2017-12-28 DIAGNOSIS — G40909 Epilepsy, unspecified, not intractable, without status epilepticus: Secondary | ICD-10-CM | POA: Diagnosis not present

## 2018-03-12 DIAGNOSIS — G40909 Epilepsy, unspecified, not intractable, without status epilepticus: Secondary | ICD-10-CM | POA: Diagnosis not present

## 2018-03-12 DIAGNOSIS — E7800 Pure hypercholesterolemia, unspecified: Secondary | ICD-10-CM | POA: Diagnosis not present

## 2018-03-12 DIAGNOSIS — E119 Type 2 diabetes mellitus without complications: Secondary | ICD-10-CM | POA: Diagnosis not present

## 2018-03-12 DIAGNOSIS — Z79899 Other long term (current) drug therapy: Secondary | ICD-10-CM | POA: Diagnosis not present

## 2018-03-12 DIAGNOSIS — I2692 Saddle embolus of pulmonary artery without acute cor pulmonale: Secondary | ICD-10-CM | POA: Diagnosis not present

## 2018-03-12 DIAGNOSIS — I10 Essential (primary) hypertension: Secondary | ICD-10-CM | POA: Diagnosis not present

## 2018-03-12 DIAGNOSIS — D509 Iron deficiency anemia, unspecified: Secondary | ICD-10-CM | POA: Diagnosis not present

## 2018-07-12 DIAGNOSIS — E119 Type 2 diabetes mellitus without complications: Secondary | ICD-10-CM | POA: Diagnosis not present

## 2018-07-12 DIAGNOSIS — I2692 Saddle embolus of pulmonary artery without acute cor pulmonale: Secondary | ICD-10-CM | POA: Diagnosis not present

## 2018-07-12 DIAGNOSIS — G40909 Epilepsy, unspecified, not intractable, without status epilepticus: Secondary | ICD-10-CM | POA: Diagnosis not present

## 2018-07-12 DIAGNOSIS — I10 Essential (primary) hypertension: Secondary | ICD-10-CM | POA: Diagnosis not present

## 2018-07-12 DIAGNOSIS — Z1331 Encounter for screening for depression: Secondary | ICD-10-CM | POA: Diagnosis not present

## 2018-09-25 DIAGNOSIS — I10 Essential (primary) hypertension: Secondary | ICD-10-CM | POA: Diagnosis not present

## 2018-09-25 DIAGNOSIS — E7800 Pure hypercholesterolemia, unspecified: Secondary | ICD-10-CM | POA: Diagnosis not present

## 2018-09-25 DIAGNOSIS — I2692 Saddle embolus of pulmonary artery without acute cor pulmonale: Secondary | ICD-10-CM | POA: Diagnosis not present

## 2018-09-25 DIAGNOSIS — Z139 Encounter for screening, unspecified: Secondary | ICD-10-CM | POA: Diagnosis not present

## 2018-09-25 DIAGNOSIS — E119 Type 2 diabetes mellitus without complications: Secondary | ICD-10-CM | POA: Diagnosis not present

## 2018-09-25 DIAGNOSIS — D509 Iron deficiency anemia, unspecified: Secondary | ICD-10-CM | POA: Diagnosis not present

## 2018-09-25 DIAGNOSIS — G40909 Epilepsy, unspecified, not intractable, without status epilepticus: Secondary | ICD-10-CM | POA: Diagnosis not present

## 2018-09-25 DIAGNOSIS — Z79899 Other long term (current) drug therapy: Secondary | ICD-10-CM | POA: Diagnosis not present

## 2018-10-29 ENCOUNTER — Emergency Department (HOSPITAL_COMMUNITY): Payer: Medicare Other

## 2018-10-29 ENCOUNTER — Emergency Department (HOSPITAL_COMMUNITY)
Admission: EM | Admit: 2018-10-29 | Discharge: 2018-10-29 | Disposition: A | Discharge status: Home / Self Care | Payer: Medicare Other | Attending: Emergency Medicine | Admitting: Emergency Medicine

## 2018-10-29 ENCOUNTER — Other Ambulatory Visit: Payer: Self-pay

## 2018-10-29 ENCOUNTER — Encounter (HOSPITAL_COMMUNITY): Payer: Self-pay | Admitting: Emergency Medicine

## 2018-10-29 DIAGNOSIS — Z8546 Personal history of malignant neoplasm of prostate: Secondary | ICD-10-CM | POA: Diagnosis not present

## 2018-10-29 DIAGNOSIS — I10 Essential (primary) hypertension: Secondary | ICD-10-CM | POA: Insufficient documentation

## 2018-10-29 DIAGNOSIS — R569 Unspecified convulsions: Secondary | ICD-10-CM | POA: Diagnosis not present

## 2018-10-29 DIAGNOSIS — Z87891 Personal history of nicotine dependence: Secondary | ICD-10-CM | POA: Insufficient documentation

## 2018-10-29 DIAGNOSIS — R404 Transient alteration of awareness: Secondary | ICD-10-CM | POA: Diagnosis not present

## 2018-10-29 DIAGNOSIS — R531 Weakness: Secondary | ICD-10-CM | POA: Diagnosis not present

## 2018-10-29 DIAGNOSIS — E119 Type 2 diabetes mellitus without complications: Secondary | ICD-10-CM | POA: Insufficient documentation

## 2018-10-29 DIAGNOSIS — G40909 Epilepsy, unspecified, not intractable, without status epilepticus: Secondary | ICD-10-CM | POA: Diagnosis not present

## 2018-10-29 DIAGNOSIS — Z79899 Other long term (current) drug therapy: Secondary | ICD-10-CM | POA: Insufficient documentation

## 2018-10-29 DIAGNOSIS — R000 Tachycardia, unspecified: Secondary | ICD-10-CM | POA: Diagnosis not present

## 2018-10-29 LAB — CBC WITH DIFFERENTIAL/PLATELET
Abs Immature Granulocytes: 0.03 10*3/uL (ref 0.00–0.07)
Basophils Absolute: 0.1 10*3/uL (ref 0.0–0.1)
Basophils Relative: 1 %
Eosinophils Absolute: 0.1 10*3/uL (ref 0.0–0.5)
Eosinophils Relative: 2 %
HCT: 45.2 % (ref 39.0–52.0)
Hemoglobin: 14.8 g/dL (ref 13.0–17.0)
Immature Granulocytes: 1 %
Lymphocytes Relative: 35 %
Lymphs Abs: 1.9 10*3/uL (ref 0.7–4.0)
MCH: 32.7 pg (ref 26.0–34.0)
MCHC: 32.7 g/dL (ref 30.0–36.0)
MCV: 99.8 fL (ref 80.0–100.0)
Monocytes Absolute: 0.5 10*3/uL (ref 0.1–1.0)
Monocytes Relative: 10 %
Neutro Abs: 2.8 10*3/uL (ref 1.7–7.7)
Neutrophils Relative %: 51 %
Platelets: 211 10*3/uL (ref 150–400)
RBC: 4.53 MIL/uL (ref 4.22–5.81)
RDW: 12.1 % (ref 11.5–15.5)
WBC: 5.4 10*3/uL (ref 4.0–10.5)
nRBC: 0 % (ref 0.0–0.2)

## 2018-10-29 LAB — POCT I-STAT EG7
Acid-base deficit: 3 mmol/L — ABNORMAL HIGH (ref 0.0–2.0)
Bicarbonate: 22.4 mmol/L (ref 20.0–28.0)
Calcium, Ion: 1.12 mmol/L — ABNORMAL LOW (ref 1.15–1.40)
HCT: 46 % (ref 39.0–52.0)
Hemoglobin: 15.6 g/dL (ref 13.0–17.0)
O2 Saturation: 65 %
Potassium: 4 mmol/L (ref 3.5–5.1)
Sodium: 139 mmol/L (ref 135–145)
TCO2: 24 mmol/L (ref 22–32)
pCO2, Ven: 41.2 mmHg — ABNORMAL LOW (ref 44.0–60.0)
pH, Ven: 7.344 (ref 7.250–7.430)
pO2, Ven: 36 mmHg (ref 32.0–45.0)

## 2018-10-29 LAB — URINALYSIS, ROUTINE W REFLEX MICROSCOPIC
Bilirubin Urine: NEGATIVE
Glucose, UA: NEGATIVE mg/dL
Ketones, ur: NEGATIVE mg/dL
Leukocytes,Ua: NEGATIVE
Nitrite: NEGATIVE
Protein, ur: 30 mg/dL — AB
Specific Gravity, Urine: 1.014 (ref 1.005–1.030)
pH: 5 (ref 5.0–8.0)

## 2018-10-29 LAB — I-STAT CHEM 8, ED
BUN: 13 mg/dL (ref 8–23)
Calcium, Ion: 1.13 mmol/L — ABNORMAL LOW (ref 1.15–1.40)
Chloride: 103 mmol/L (ref 98–111)
Creatinine, Ser: 0.9 mg/dL (ref 0.61–1.24)
Glucose, Bld: 207 mg/dL — ABNORMAL HIGH (ref 70–99)
HCT: 47 % (ref 39.0–52.0)
Hemoglobin: 16 g/dL (ref 13.0–17.0)
Potassium: 4 mmol/L (ref 3.5–5.1)
Sodium: 139 mmol/L (ref 135–145)
TCO2: 22 mmol/L (ref 22–32)

## 2018-10-29 LAB — PHENYTOIN LEVEL, TOTAL: Phenytoin Lvl: 12.9 ug/mL (ref 10.0–20.0)

## 2018-10-29 LAB — CBG MONITORING, ED: Glucose-Capillary: 174 mg/dL — ABNORMAL HIGH (ref 70–99)

## 2018-10-29 MED ORDER — FOSFOMYCIN TROMETHAMINE 3 G PO PACK
3.0000 g | PACK | Freq: Once | ORAL | Status: AC
  Administered 2018-10-29: 3 g via ORAL
  Filled 2018-10-29: qty 3

## 2018-10-29 MED ORDER — LEVETIRACETAM IN NACL 1000 MG/100ML IV SOLN
1000.0000 mg | Freq: Once | INTRAVENOUS | Status: AC
  Administered 2018-10-29: 1000 mg via INTRAVENOUS
  Filled 2018-10-29: qty 100

## 2018-10-29 NOTE — ED Triage Notes (Signed)
[name] for seizures. EMS reports the pt's roommate heard some commotion and woke up. When he went into the room the pt was unconscious and not responding. EMS reports when they arrived the pt was postictal and incontinent of urine. EMS advised the pt remained postictal up to the arrival in the ED. EMS reports IV as noted, 12 lead unremarkable, and no oral trauma. EMS advised the pt is CAO x4.   Pt does not remember the event and cannot remember how long ago his last seizure was.

## 2018-10-29 NOTE — ED Notes (Signed)
Please call brother Jlynn Langille @ [phone] for a status update--Deklen Popelka

## 2018-10-29 NOTE — ED Notes (Signed)
Patient transported to CT 

## 2018-10-29 NOTE — ED Provider Notes (Signed)
[facility] EMERGENCY DEPARTMENT Provider Note   CSN: [id number] Arrival date & time: 10/29/18  0455     History   Chief Complaint Chief Complaint  Patient presents with  . Seizures    HPI [name] is a 84 y.o. male.     The history is provided by the EMS personnel. The history is limited by the condition of the patient (post ictal).  Seizures Seizure activity on arrival: no   Seizure type:  Grand mal Preceding symptoms: no sensation of an aura present, no dizziness and no euphoria   Initial focality:  None Episode characteristics: abnormal movements   Postictal symptoms: confusion and somnolence   Severity:  Moderate Timing:  Once Number of seizures this episode:  1 Progression:  Resolved Context: not alcohol withdrawal, not cerebral palsy, not drug use, not fever, medical compliance, not possible hypoglycemia, not possible medication ingestion, not pregnant, not previous head injury and not stress   Recent head injury:  No recent head injuries PTA treatment:  None History of seizures: yes   Similar to previous episodes: yes   Severity:  Moderate Seizure control level:  Well controlled Current therapy:  Phenytoin Compliance with current therapy:  Good   Past Medical History:  Diagnosis Date  . Cancer [facility])    Prostate  . Cervicalgia   . Essential hypertension, benign   . History of loop recorder 07/14/2014  . Impotence of organic origin   . Pulmonary embolism ([place])   . Pure hypercholesterolemia   . Seizures ([place])   . Type II or unspecified type diabetes mellitus without mention of complication, not stated as uncontrolled     Patient Active Problem List   Diagnosis Date Noted  . Acute encephalopathy 05/17/2015  . Pyrexia   . UTI (lower urinary tract infection) 05/16/2015  . Syncope 07/14/2014  . History of loop recorder 07/14/2014  . Chronic anticoagulation 03/26/2014  . Dyslipidemia 03/24/2014  . Hx pulmonary embolism 10/25/2013  .  HTN (hypertension) 10/25/2013  . Seizures ([place]) 09/10/2012    Past Surgical History:  Procedure Laterality Date  . LOOP RECORDER IMPLANT N/A 03/26/2014   Procedure: LOOP RECORDER IMPLANT;  Surgeon: Sanda Klein, MD;  Location: [facility] CATH LAB;  Service: Cardiovascular;  Laterality: N/A;        Home Medications    Prior to Admission medications   Medication Sig Start Date End Date Taking? Authorizing Provider  edoxaban (SAVAYSA) 60 MG TABS tablet Take 60 mg by mouth daily. Patient taking differently: Take 60 mg by mouth at bedtime.  03/26/14   Burns, Arloa Koh, MD  ferrous sulfate 325 (65 FE) MG EC tablet Take 1 tablet by mouth daily. 08/14/17   [provider]  lisinopril (PRINIVIL,ZESTRIL) 30 MG tablet Take 30 mg by mouth daily.    [provider]  phenytoin (DILANTIN) 100 MG ER capsule Dilantin total 130 milligrams every morning. 300 mg every afternoon 10/04/16   Tanna Furry, MD  phenytoin (DILANTIN) 30 MG ER capsule Take 3 capsules (90 mg total) by mouth daily. Dilantin dosage 130 every morning. 300 every afternoon 10/04/16   Tanna Furry, MD  pravastatin (PRAVACHOL) 40 MG tablet Take 40 mg by mouth at bedtime.    [provider]    Family History Family History  Problem Relation Age of Onset  . Cancer Mother   . Cancer Father     Social History Social History   Tobacco Use  . Smoking status: Former Research scientist (life sciences)  .  Smokeless tobacco: Never Used  Substance Use Topics  . Alcohol use: No  . Drug use: No     Allergies   Patient has no known allergies.   Review of Systems Review of Systems  Unable to perform ROS: Other (post ictal seizure occured while patient was asleep in beed.  )  Constitutional: Negative for fever.  Eyes: Negative for visual disturbance.  Respiratory: Negative for cough and shortness of breath.   Cardiovascular: Negative for chest pain.  Genitourinary: Negative for flank pain.  Neurological: Positive for seizures. Negative for  dizziness, facial asymmetry and headaches.     Physical Exam Updated Vital Signs There were no vitals taken for this visit.  Physical Exam Vitals signs and nursing note reviewed.  Constitutional:      General: He is not in acute distress.    Appearance: He is normal weight.  HENT:     Head: Normocephalic and atraumatic.     Right Ear: Tympanic membrane normal.     Left Ear: Tympanic membrane normal.     Nose: Nose normal.  Eyes:     Extraocular Movements: Extraocular movements intact.     Conjunctiva/sclera: Conjunctivae normal.     Pupils: Pupils are equal, round, and reactive to light.  Neck:     Musculoskeletal: Normal range of motion and neck supple.  Cardiovascular:     Rate and Rhythm: Normal rate and regular rhythm.     Pulses: Normal pulses.     Heart sounds: Normal heart sounds.  Pulmonary:     Effort: Pulmonary effort is normal.     Breath sounds: Normal breath sounds.  Abdominal:     General: Abdomen is flat. Bowel sounds are normal.     Tenderness: There is no abdominal tenderness. There is no guarding or rebound.  Musculoskeletal: Normal range of motion.  Skin:    General: Skin is warm and dry.     Capillary Refill: Capillary refill takes less than 2 seconds.  Neurological:     General: No focal deficit present.     Mental Status: He is alert.  Psychiatric:        Mood and Affect: Mood normal.        Behavior: Behavior normal.      ED Treatments / Results  Labs (all labs ordered are listed, but only abnormal results are displayed) Results for orders placed or performed during the hospital encounter of 10/29/18  CBC with Differential  Result Value Ref Range   WBC 5.4 4.0 - 10.5 K/uL   RBC 4.53 4.22 - 5.81 MIL/uL   Hemoglobin 14.8 13.0 - 17.0 g/dL   HCT 45.2 39.0 - 52.0 %   MCV 99.8 80.0 - 100.0 fL   MCH 32.7 26.0 - 34.0 pg   MCHC 32.7 30.0 - 36.0 g/dL   RDW 12.1 11.5 - 15.5 %   Platelets 211 150 - 400 K/uL   nRBC 0.0 0.0 - 0.2 %    Neutrophils Relative % 51 %   Neutro Abs 2.8 1.7 - 7.7 K/uL   Lymphocytes Relative 35 %   Lymphs Abs 1.9 0.7 - 4.0 K/uL   Monocytes Relative 10 %   Monocytes Absolute 0.5 0.1 - 1.0 K/uL   Eosinophils Relative 2 %   Eosinophils Absolute 0.1 0.0 - 0.5 K/uL   Basophils Relative 1 %   Basophils Absolute 0.1 0.0 - 0.1 K/uL   Immature Granulocytes 1 %   Abs Immature Granulocytes 0.03 0.00 - 0.07 K/uL  Phenytoin level, total  Result Value Ref Range   Phenytoin Lvl 12.9 10.0 - 20.0 ug/mL  CBG monitoring, ED  Result Value Ref Range   Glucose-Capillary 174 (H) 70 - 99 mg/dL  I-stat chem 8, ED (not at [facility] or [facility])  Result Value Ref Range   Sodium 139 135 - 145 mmol/L   Potassium 4.0 3.5 - 5.1 mmol/L   Chloride 103 98 - 111 mmol/L   BUN 13 8 - 23 mg/dL   Creatinine, Ser 0.90 0.61 - 1.24 mg/dL   Glucose, Bld 207 (H) 70 - 99 mg/dL   Calcium, Ion 1.13 (L) 1.15 - 1.40 mmol/L   TCO2 22 22 - 32 mmol/L   Hemoglobin 16.0 13.0 - 17.0 g/dL   HCT 47.0 39.0 - 52.0 %  POCT I-Stat EG7  Result Value Ref Range   pH, Ven 7.344 7.250 - 7.430   pCO2, Ven 41.2 (L) 44.0 - 60.0 mmHg   pO2, Ven 36.0 32.0 - 45.0 mmHg   Bicarbonate 22.4 20.0 - 28.0 mmol/L   TCO2 24 22 - 32 mmol/L   O2 Saturation 65.0 %   Acid-base deficit 3.0 (H) 0.0 - 2.0 mmol/L   Sodium 139 135 - 145 mmol/L   Potassium 4.0 3.5 - 5.1 mmol/L   Calcium, Ion 1.12 (L) 1.15 - 1.40 mmol/L   HCT 46.0 39.0 - 52.0 %   Hemoglobin 15.6 13.0 - 17.0 g/dL   Patient temperature HIDE    Sample type VENOUS    Comment NOTIFIED PHYSICIAN    No results found.  EKG  Date: 10/29/2018  Rate: 102  Rhythm:  sinus tachycardia  QRS Axis: normal  Intervals: normal  ST/T Wave abnormalities: normal  Conduction Disutrbances: none  Narrative Interpretation: sinus tachycardia    Radiology No results found.  Procedures Procedures (including critical care time)  Medications Ordered in ED Medications  levETIRAcetam (KEPPRA) IVPB 1000 mg/100 mL premix  (has no administration in time range)       Case d/w Dr. Lorraine Lax no change in meds at this time for one break through seizure.  Follow up with your neurologist.    Final Clinical Impressions(s) / ED Diagnoses   Final diagnoses:  Seizure disorder ([place])    Return for intractable cough, coughing up blood,fevers >100.4 unrelieved by medication, shortness of breath, intractable vomiting, chest pain, shortness of breath, weakness,numbness, changes in speech, facial asymmetry,abdominal pain, passing out,Inability to tolerate liquids or food, cough, altered mental status or any concerns. No signs of systemic illness or infection. The patient is nontoxic-appearing on exam and vital signs are within normal limits.   I have reviewed the triage vital signs and the nursing notes. Pertinent labs &imaging results that were available during my care of the patient were reviewed by me and considered in my medical decision making (see chart for details).  After history, exam, and medical workup I feel the patient has been appropriately medically screened and is safe for discharge home. Pertinent diagnoses were discussed with the patient. Patient was given return precautions    Twilia Yaklin, MD 10/29/18 [phone]

## 2018-10-29 NOTE — ED Notes (Signed)
Patient verbalizes understanding of discharge instructions. Opportunity for questioning and answers were provided. Armband removed by staff, pt discharged from ED home via POV.  

## 2018-10-30 LAB — URINE CULTURE
Culture: NO GROWTH
Special Requests: NORMAL

## 2018-11-04 DIAGNOSIS — Z9181 History of falling: Secondary | ICD-10-CM | POA: Diagnosis not present

## 2018-11-04 DIAGNOSIS — Z1331 Encounter for screening for depression: Secondary | ICD-10-CM | POA: Diagnosis not present

## 2018-11-04 DIAGNOSIS — Z0000 Encounter for general adult medical examination without abnormal findings: Secondary | ICD-10-CM | POA: Diagnosis not present

## 2018-11-04 DIAGNOSIS — E785 Hyperlipidemia, unspecified: Secondary | ICD-10-CM | POA: Diagnosis not present

## 2018-11-13 DIAGNOSIS — I2692 Saddle embolus of pulmonary artery without acute cor pulmonale: Secondary | ICD-10-CM | POA: Diagnosis not present

## 2018-11-13 DIAGNOSIS — G40909 Epilepsy, unspecified, not intractable, without status epilepticus: Secondary | ICD-10-CM | POA: Diagnosis not present

## 2018-11-13 DIAGNOSIS — I10 Essential (primary) hypertension: Secondary | ICD-10-CM | POA: Diagnosis not present

## 2018-11-13 DIAGNOSIS — Z6827 Body mass index (BMI) 27.0-27.9, adult: Secondary | ICD-10-CM | POA: Diagnosis not present

## 2018-12-26 ENCOUNTER — Ambulatory Visit: Payer: Medicare Other | Admitting: Neurology

## 2018-12-26 ENCOUNTER — Encounter: Payer: Self-pay | Admitting: Neurology

## 2018-12-26 ENCOUNTER — Telehealth: Payer: Self-pay | Admitting: *Deleted

## 2018-12-26 NOTE — Telephone Encounter (Signed)
No showed new patient appointment. 

## 2019-01-02 ENCOUNTER — Emergency Department (HOSPITAL_COMMUNITY)
Admission: EM | Admit: 2019-01-02 | Discharge: 2019-01-02 | Disposition: A | Discharge status: Home / Self Care | Payer: Medicare Other | Attending: Emergency Medicine | Admitting: Emergency Medicine

## 2019-01-02 ENCOUNTER — Other Ambulatory Visit: Payer: Self-pay

## 2019-01-02 DIAGNOSIS — E119 Type 2 diabetes mellitus without complications: Secondary | ICD-10-CM | POA: Diagnosis not present

## 2019-01-02 DIAGNOSIS — Z8546 Personal history of malignant neoplasm of prostate: Secondary | ICD-10-CM | POA: Insufficient documentation

## 2019-01-02 DIAGNOSIS — R456 Violent behavior: Secondary | ICD-10-CM | POA: Diagnosis not present

## 2019-01-02 DIAGNOSIS — R5383 Other fatigue: Secondary | ICD-10-CM | POA: Diagnosis not present

## 2019-01-02 DIAGNOSIS — I10 Essential (primary) hypertension: Secondary | ICD-10-CM | POA: Diagnosis not present

## 2019-01-02 DIAGNOSIS — Z79899 Other long term (current) drug therapy: Secondary | ICD-10-CM | POA: Insufficient documentation

## 2019-01-02 DIAGNOSIS — R569 Unspecified convulsions: Secondary | ICD-10-CM | POA: Insufficient documentation

## 2019-01-02 DIAGNOSIS — Z87891 Personal history of nicotine dependence: Secondary | ICD-10-CM | POA: Diagnosis not present

## 2019-01-02 DIAGNOSIS — R4020 Unspecified coma: Secondary | ICD-10-CM | POA: Diagnosis not present

## 2019-01-02 DIAGNOSIS — G40909 Epilepsy, unspecified, not intractable, without status epilepticus: Secondary | ICD-10-CM | POA: Diagnosis not present

## 2019-01-02 DIAGNOSIS — R404 Transient alteration of awareness: Secondary | ICD-10-CM | POA: Diagnosis not present

## 2019-01-02 DIAGNOSIS — R000 Tachycardia, unspecified: Secondary | ICD-10-CM | POA: Diagnosis not present

## 2019-01-02 LAB — COMPREHENSIVE METABOLIC PANEL
ALT: 13 U/L (ref 0–44)
AST: 22 U/L (ref 15–41)
Albumin: 4.1 g/dL (ref 3.5–5.0)
Alkaline Phosphatase: 97 U/L (ref 38–126)
Anion gap: 11 (ref 5–15)
BUN: 8 mg/dL (ref 8–23)
CO2: 25 mmol/L (ref 22–32)
Calcium: 8.5 mg/dL — ABNORMAL LOW (ref 8.9–10.3)
Chloride: 103 mmol/L (ref 98–111)
Creatinine, Ser: 0.83 mg/dL (ref 0.61–1.24)
GFR calc Af Amer: >60 mL/min (ref >60)
GFR calc non Af Amer: >60 mL/min (ref >60)
Glucose, Bld: 186 mg/dL — ABNORMAL HIGH (ref 70–99)
Potassium: 3.9 mmol/L (ref 3.5–5.1)
Sodium: 139 mmol/L (ref 135–145)
Total Bilirubin: 0.6 mg/dL (ref 0.3–1.2)
Total Protein: 7.2 g/dL (ref 6.5–8.1)

## 2019-01-02 LAB — CBC WITH DIFFERENTIAL/PLATELET
Abs Immature Granulocytes: 0.02 10*3/uL (ref 0.00–0.07)
Basophils Absolute: 0.1 10*3/uL (ref 0.0–0.1)
Basophils Relative: 1 %
Eosinophils Absolute: 0.1 10*3/uL (ref 0.0–0.5)
Eosinophils Relative: 1 %
HCT: 45.3 % (ref 39.0–52.0)
Hemoglobin: 14.7 g/dL (ref 13.0–17.0)
Immature Granulocytes: 0 %
Lymphocytes Relative: 14 %
Lymphs Abs: 1 10*3/uL (ref 0.7–4.0)
MCH: 33 pg (ref 26.0–34.0)
MCHC: 32.5 g/dL (ref 30.0–36.0)
MCV: 101.6 fL — ABNORMAL HIGH (ref 80.0–100.0)
Monocytes Absolute: 0.6 10*3/uL (ref 0.1–1.0)
Monocytes Relative: 9 %
Neutro Abs: 5.6 10*3/uL (ref 1.7–7.7)
Neutrophils Relative %: 75 %
Platelets: 204 10*3/uL (ref 150–400)
RBC: 4.46 MIL/uL (ref 4.22–5.81)
RDW: 12.9 % (ref 11.5–15.5)
WBC: 7.3 10*3/uL (ref 4.0–10.5)
nRBC: 0 % (ref 0.0–0.2)

## 2019-01-02 LAB — URINALYSIS, ROUTINE W REFLEX MICROSCOPIC
Bacteria, UA: NONE SEEN
Bilirubin Urine: NEGATIVE
Glucose, UA: NEGATIVE mg/dL
Ketones, ur: NEGATIVE mg/dL
Leukocytes,Ua: NEGATIVE
Nitrite: NEGATIVE
Protein, ur: 30 mg/dL — AB
Specific Gravity, Urine: 1.013 (ref 1.005–1.030)
pH: 6 (ref 5.0–8.0)

## 2019-01-02 LAB — PHENYTOIN LEVEL, TOTAL: Phenytoin Lvl: 13.2 ug/mL (ref 10.0–20.0)

## 2019-01-02 MED ORDER — LEVETIRACETAM 500 MG PO TABS: 500.0000 mg | ORAL_TABLET | Freq: Two times a day (BID) | ORAL | 0 refills | Status: DC

## 2019-01-02 MED ORDER — ACETAMINOPHEN 500 MG PO TABS
1000.0000 mg | ORAL_TABLET | Freq: Once | ORAL | Status: AC
  Administered 2019-01-02: 1000 mg via ORAL
  Filled 2019-01-02: qty 2

## 2019-01-02 MED ORDER — SODIUM CHLORIDE 0.9 % IV BOLUS
500.0000 mL | Freq: Once | INTRAVENOUS | Status: AC
  Administered 2019-01-02: 500 mL via INTRAVENOUS

## 2019-01-02 MED ORDER — LEVETIRACETAM IN NACL 1000 MG/100ML IV SOLN
1000.0000 mg | Freq: Once | INTRAVENOUS | Status: AC
  Administered 2019-01-02: 1000 mg via INTRAVENOUS
  Filled 2019-01-02: qty 100

## 2019-01-02 NOTE — ED Provider Notes (Signed)
[facility] DEPT Provider Note   CSN: [id number] Arrival date & time: 01/02/19  0750     History   Chief Complaint No chief complaint on file.   HPI [name] is a 84 y.o. male.     HPI   84yo male with history of hypertension, PE, hypercholesterolemia, seziures, DM, presents with concern for seizure. History is limited as patient does not remember details.  Attempted to call brother but unable to get in touch.  Per MES, they were called with concern for seizure activity at 655, grandmal for 4 minutes.  Per EMS, pt also postictal in transport until just prior to arrival.  On my history, patient reports he does not remember what happened, just feels tired now.  Denies recent illness, fevers, vomiting, diarrhea, urinary symptoms, cough, neurologic symptoms. Denies headache initially but them reports left facial pain which has been going on for months, no acute changes, no assoc neuro symptoms n/v.  No trauma.   Past Medical History:  Diagnosis Date  . Cancer [facility])    Prostate  . Cervicalgia   . Essential hypertension, benign   . History of loop recorder 07/14/2014  . Impotence of organic origin   . Pulmonary embolism ([place])   . Pure hypercholesterolemia   . Seizures ([place])   . Type II or unspecified type diabetes mellitus without mention of complication, not stated as uncontrolled     Patient Active Problem List   Diagnosis Date Noted  . Acute encephalopathy 05/17/2015  . Pyrexia   . UTI (lower urinary tract infection) 05/16/2015  . Syncope 07/14/2014  . History of loop recorder 07/14/2014  . Chronic anticoagulation 03/26/2014  . Dyslipidemia 03/24/2014  . Hx pulmonary embolism 10/25/2013  . HTN (hypertension) 10/25/2013  . Seizures ([place]) 09/10/2012    Past Surgical History:  Procedure Laterality Date  . LOOP RECORDER IMPLANT N/A 03/26/2014   Procedure: LOOP RECORDER IMPLANT;  Surgeon: Sanda Klein, MD;  Location: [facility] CATH LAB;  Service:  Cardiovascular;  Laterality: N/A;        Home Medications    Prior to Admission medications   Medication Sig Start Date End Date Taking? Authorizing Provider  edoxaban (SAVAYSA) 60 MG TABS tablet Take 60 mg by mouth daily. Patient taking differently: Take 60 mg by mouth at bedtime.  03/26/14   Burns, Arloa Koh, MD  ferrous sulfate 325 (65 FE) MG EC tablet Take 325 mg by mouth 2 (two) times daily.  08/14/17   [provider]  levETIRAcetam (KEPPRA) 500 MG tablet Take 1 tablet (500 mg total) by mouth 2 (two) times daily. 01/02/19   Gareth Morgan, MD  lisinopril (PRINIVIL,ZESTRIL) 30 MG tablet Take 30 mg by mouth daily.    [provider]  phenytoin (DILANTIN) 100 MG ER capsule Dilantin total 130 milligrams every morning. 300 mg every afternoon Patient taking differently: Take 160-200 mg by mouth 2 (two) times daily. Dilantin total 160 milligrams every morning. 200 mg every afternoon 10/04/16   Tanna Furry, MD  phenytoin (DILANTIN) 30 MG ER capsule Take 3 capsules (90 mg total) by mouth daily. Dilantin dosage 130 every morning. 300 every afternoon Patient taking differently: Take 160 mg by mouth daily. Takes along with a 100 mg capsule every morning 10/04/16   Tanna Furry, MD  pravastatin (PRAVACHOL) 80 MG tablet Take 80 mg by mouth daily. 09/26/18   [provider]    Family History Family History  Problem Relation Age of Onset  . Cancer  Mother   . Cancer Father     Social History Social History   Tobacco Use  . Smoking status: Former Research scientist (life sciences)  . Smokeless tobacco: Never Used  Substance Use Topics  . Alcohol use: No  . Drug use: No     Allergies   Patient has no known allergies.   Review of Systems Review of Systems  Constitutional: Positive for fatigue. Negative for fever.  HENT: Negative for sore throat.   Eyes: Negative for visual disturbance.  Respiratory: Negative for shortness of breath.   Cardiovascular: Negative for chest pain.   Gastrointestinal: Negative for abdominal pain.  Genitourinary: Negative for difficulty urinating.  Musculoskeletal: Negative for back pain and neck stiffness.  Skin: Negative for rash.  Neurological: Positive for seizures. Negative for syncope and headaches.     Physical Exam Updated Vital Signs BP (!) 188/98 Comment: MD at bedside for DC  Pulse 94 Comment: MD at bedside for DC  Temp 98.6 F (37 C) (Oral)   Resp 17 Comment: MD at bedside for DC  Ht 6' (1.829 m)   Wt 86.2 kg   SpO2 97% Comment: MD at bedside for DC  BMI 25.77 kg/m   Physical Exam Vitals signs and nursing note reviewed.  Constitutional:      General: He is not in acute distress.    Appearance: He is well-developed. He is not diaphoretic.  HENT:     Head: Normocephalic and atraumatic.  Eyes:     General: No visual field deficit.    Conjunctiva/sclera: Conjunctivae normal.  Neck:     Musculoskeletal: Normal range of motion.  Cardiovascular:     Rate and Rhythm: Normal rate and regular rhythm.     Heart sounds: Normal heart sounds. No murmur. No friction rub. No gallop.   Pulmonary:     Effort: Pulmonary effort is normal. No respiratory distress.     Breath sounds: Normal breath sounds. No wheezing or rales.  Abdominal:     General: There is no distension.     Palpations: Abdomen is soft.     Tenderness: There is no abdominal tenderness. There is no guarding.  Skin:    General: Skin is warm and dry.  Neurological:     Mental Status: He is alert and oriented to person, place, and time.     GCS: GCS eye subscore is 4. GCS verbal subscore is 5. GCS motor subscore is 6.     Cranial Nerves: Cranial nerves are intact. No cranial nerve deficit, dysarthria or facial asymmetry.     Sensory: Sensation is intact.     Motor: Motor function is intact.     Coordination: Coordination is intact. Finger-Nose-Finger Test and Heel to [place] Test normal.      ED Treatments / Results  Labs (all labs ordered are listed,  but only abnormal results are displayed) Labs Reviewed  CBC WITH DIFFERENTIAL/PLATELET - Abnormal; Notable for the following components:      Result Value   MCV 101.6 (*)    All other components within normal limits  COMPREHENSIVE METABOLIC PANEL - Abnormal; Notable for the following components:   Glucose, Bld 186 (*)    Calcium 8.5 (*)    All other components within normal limits  URINALYSIS, ROUTINE W REFLEX MICROSCOPIC - Abnormal; Notable for the following components:   Hgb urine dipstick SMALL (*)    Protein, ur 30 (*)    All other components within normal limits  URINE CULTURE  PHENYTOIN LEVEL, TOTAL  EKG EKG Interpretation  Date/Time:  Thursday January 02 2019 08:02:06 EDT Ventricular Rate:  110 PR Interval:    QRS Duration: 81 QT Interval:  368 QTC Calculation: 498 R Axis:   46 Text Interpretation:  Sinus tachycardia Prolonged PR interval Anteroseptal infarct, old Nonspecific T abnormalities, lateral leads No significant change since last tracing Confirmed by Gareth Morgan [phone]) on 01/02/2019 10:16:15 AM   Radiology No results found.  Procedures Procedures (including critical care time)  Medications Ordered in ED Medications  levETIRAcetam (KEPPRA) IVPB 1000 mg/100 mL premix (0 mg Intravenous Stopped 01/02/19 0923)  sodium chloride 0.9 % bolus 500 mL (0 mLs Intravenous Stopped 01/02/19 1200)  acetaminophen (TYLENOL) tablet 1,000 mg (1,000 mg Oral Given 01/02/19 1051)     Initial Impression / Assessment and Plan / ED Course  I have reviewed the triage vital signs and the nursing notes.  Pertinent labs & imaging results that were available during my care of the patient were reviewed by me and considered in my medical decision making (see chart for details).        84yo male with history of hypertension, PE, hypercholesterolemia, seziures, DM, presents with concern for seizure.  Patient awake, alert, denies neurologic or infectious symptoms.  No history of  trauma or fall, doubt acute ICH.  Labs without acute findings, UA without infection.    Given keppra IV. Will initiate keppra 500mg  BID given breakthrough seizure on medications and recommend patient follow up with Neurology.   Final Clinical Impressions(s) / ED Diagnoses   Final diagnoses:  Seizure [facility])    ED Discharge Orders         Ordered    levETIRAcetam (KEPPRA) 500 MG tablet  2 times daily     01/02/19 1156           Gareth Morgan, MD 01/02/19 2023

## 2019-01-02 NOTE — ED Triage Notes (Signed)
Pt arrived via [facility] from home CC seizure activity this morning 0655 grandmal duration 4 min. Pt denies falling was in bed incontinent of urine with secretions on pillow. Hx of seizures and HTN pt may or may not be compliant with medication.   Per EMS upon arrival postical in transport until arrival A/o 2 to Person and place  18G Left forearm CGB @710  was 177

## 2019-01-02 NOTE — ED Notes (Signed)
Pt verbalized understanding of DC instructions. Pt assisted to wheelchair for transport out of ED. Brother OTW for pick up. Pt was provided shirt from ED clothes closet. VSS

## 2019-01-03 LAB — URINE CULTURE: Culture: <10000 — AB

## 2019-01-28 DIAGNOSIS — G40909 Epilepsy, unspecified, not intractable, without status epilepticus: Secondary | ICD-10-CM | POA: Diagnosis not present

## 2019-01-28 DIAGNOSIS — E119 Type 2 diabetes mellitus without complications: Secondary | ICD-10-CM | POA: Diagnosis not present

## 2019-01-28 DIAGNOSIS — Z79899 Other long term (current) drug therapy: Secondary | ICD-10-CM | POA: Diagnosis not present

## 2019-01-28 DIAGNOSIS — E7800 Pure hypercholesterolemia, unspecified: Secondary | ICD-10-CM | POA: Diagnosis not present

## 2019-01-28 DIAGNOSIS — I2692 Saddle embolus of pulmonary artery without acute cor pulmonale: Secondary | ICD-10-CM | POA: Diagnosis not present

## 2019-01-28 DIAGNOSIS — I10 Essential (primary) hypertension: Secondary | ICD-10-CM | POA: Diagnosis not present

## 2019-01-28 DIAGNOSIS — D509 Iron deficiency anemia, unspecified: Secondary | ICD-10-CM | POA: Diagnosis not present

## 2019-02-05 ENCOUNTER — Encounter: Payer: Self-pay | Admitting: *Deleted

## 2019-02-06 ENCOUNTER — Ambulatory Visit: Payer: Medicare Other | Admitting: Neurology

## 2019-03-07 ENCOUNTER — Observation Stay (HOSPITAL_COMMUNITY)
Admission: EM | Admit: 2019-03-07 | Discharge: 2019-03-08 | Disposition: A | Discharge status: Home / Self Care | Payer: Medicare Other | Attending: Student in an Organized Health Care Education/Training Program | Admitting: Student in an Organized Health Care Education/Training Program

## 2019-03-07 ENCOUNTER — Emergency Department (HOSPITAL_COMMUNITY): Payer: Medicare Other

## 2019-03-07 ENCOUNTER — Encounter (HOSPITAL_COMMUNITY): Payer: Self-pay

## 2019-03-07 ENCOUNTER — Other Ambulatory Visit: Payer: Self-pay

## 2019-03-07 ENCOUNTER — Other Ambulatory Visit: Payer: Self-pay | Admitting: Internal Medicine

## 2019-03-07 DIAGNOSIS — Z923 Personal history of irradiation: Secondary | ICD-10-CM | POA: Insufficient documentation

## 2019-03-07 DIAGNOSIS — J984 Other disorders of lung: Secondary | ICD-10-CM | POA: Diagnosis not present

## 2019-03-07 DIAGNOSIS — Z87891 Personal history of nicotine dependence: Secondary | ICD-10-CM | POA: Diagnosis not present

## 2019-03-07 DIAGNOSIS — Z8546 Personal history of malignant neoplasm of prostate: Secondary | ICD-10-CM | POA: Diagnosis not present

## 2019-03-07 DIAGNOSIS — I071 Rheumatic tricuspid insufficiency: Secondary | ICD-10-CM | POA: Diagnosis not present

## 2019-03-07 DIAGNOSIS — W19XXXA Unspecified fall, initial encounter: Secondary | ICD-10-CM | POA: Diagnosis not present

## 2019-03-07 DIAGNOSIS — Z809 Family history of malignant neoplasm, unspecified: Secondary | ICD-10-CM | POA: Diagnosis not present

## 2019-03-07 DIAGNOSIS — G40909 Epilepsy, unspecified, not intractable, without status epilepticus: Principal | ICD-10-CM | POA: Insufficient documentation

## 2019-03-07 DIAGNOSIS — Z20828 Contact with and (suspected) exposure to other viral communicable diseases: Secondary | ICD-10-CM | POA: Diagnosis not present

## 2019-03-07 DIAGNOSIS — R0902 Hypoxemia: Secondary | ICD-10-CM | POA: Insufficient documentation

## 2019-03-07 DIAGNOSIS — I700 Atherosclerosis of aorta: Secondary | ICD-10-CM | POA: Diagnosis not present

## 2019-03-07 DIAGNOSIS — Z79899 Other long term (current) drug therapy: Secondary | ICD-10-CM | POA: Diagnosis not present

## 2019-03-07 DIAGNOSIS — E7800 Pure hypercholesterolemia, unspecified: Secondary | ICD-10-CM | POA: Insufficient documentation

## 2019-03-07 DIAGNOSIS — Z86711 Personal history of pulmonary embolism: Secondary | ICD-10-CM | POA: Insufficient documentation

## 2019-03-07 DIAGNOSIS — I6782 Cerebral ischemia: Secondary | ICD-10-CM | POA: Diagnosis not present

## 2019-03-07 DIAGNOSIS — G4700 Insomnia, unspecified: Secondary | ICD-10-CM | POA: Diagnosis not present

## 2019-03-07 DIAGNOSIS — G40919 Epilepsy, unspecified, intractable, without status epilepticus: Secondary | ICD-10-CM | POA: Insufficient documentation

## 2019-03-07 DIAGNOSIS — E785 Hyperlipidemia, unspecified: Secondary | ICD-10-CM | POA: Diagnosis not present

## 2019-03-07 DIAGNOSIS — I119 Hypertensive heart disease without heart failure: Secondary | ICD-10-CM | POA: Diagnosis not present

## 2019-03-07 DIAGNOSIS — R55 Syncope and collapse: Secondary | ICD-10-CM

## 2019-03-07 DIAGNOSIS — Z7901 Long term (current) use of anticoagulants: Secondary | ICD-10-CM | POA: Diagnosis not present

## 2019-03-07 DIAGNOSIS — G9340 Encephalopathy, unspecified: Secondary | ICD-10-CM | POA: Diagnosis present

## 2019-03-07 DIAGNOSIS — E1136 Type 2 diabetes mellitus with diabetic cataract: Secondary | ICD-10-CM | POA: Diagnosis not present

## 2019-03-07 DIAGNOSIS — R569 Unspecified convulsions: Secondary | ICD-10-CM | POA: Diagnosis present

## 2019-03-07 LAB — URINALYSIS, ROUTINE W REFLEX MICROSCOPIC
Bilirubin Urine: NEGATIVE
Glucose, UA: NEGATIVE mg/dL
Ketones, ur: NEGATIVE mg/dL
Leukocytes,Ua: NEGATIVE
Nitrite: NEGATIVE
Protein, ur: NEGATIVE mg/dL
Specific Gravity, Urine: 1.025 (ref 1.005–1.030)
pH: 5 (ref 5.0–8.0)

## 2019-03-07 LAB — BASIC METABOLIC PANEL
Anion gap: 10 (ref 5–15)
BUN: 10 mg/dL (ref 8–23)
CO2: 23 mmol/L (ref 22–32)
Calcium: 8.6 mg/dL — ABNORMAL LOW (ref 8.9–10.3)
Chloride: 108 mmol/L (ref 98–111)
Creatinine, Ser: 1.23 mg/dL (ref 0.61–1.24)
GFR calc Af Amer: >60 mL/min (ref >60)
GFR calc non Af Amer: 54 mL/min — ABNORMAL LOW (ref >60)
Glucose, Bld: 180 mg/dL — ABNORMAL HIGH (ref 70–99)
Potassium: 3.9 mmol/L (ref 3.5–5.1)
Sodium: 141 mmol/L (ref 135–145)

## 2019-03-07 LAB — RAPID URINE DRUG SCREEN, HOSP PERFORMED
Amphetamines: NOT DETECTED
Barbiturates: NOT DETECTED
Benzodiazepines: NOT DETECTED
Cocaine: NOT DETECTED
Opiates: NOT DETECTED
Tetrahydrocannabinol: NOT DETECTED

## 2019-03-07 LAB — CBC
HCT: 42.8 % (ref 39.0–52.0)
Hemoglobin: 14.3 g/dL (ref 13.0–17.0)
MCH: 33.3 pg (ref 26.0–34.0)
MCHC: 33.4 g/dL (ref 30.0–36.0)
MCV: 99.5 fL (ref 80.0–100.0)
Platelets: 185 10*3/uL (ref 150–400)
RBC: 4.3 MIL/uL (ref 4.22–5.81)
RDW: 12.9 % (ref 11.5–15.5)
WBC: 10.4 10*3/uL (ref 4.0–10.5)
nRBC: 0 % (ref 0.0–0.2)

## 2019-03-07 LAB — SARS CORONAVIRUS 2 (TAT 6-24 HRS): SARS Coronavirus 2: NEGATIVE

## 2019-03-07 LAB — PHENYTOIN LEVEL, TOTAL: Phenytoin Lvl: 12.7 ug/mL (ref 10.0–20.0)

## 2019-03-07 LAB — URINALYSIS, MICROSCOPIC (REFLEX)
Bacteria, UA: NONE SEEN
RBC / HPF: NONE SEEN RBC/hpf (ref 0–5)
Squamous Epithelial / HPF: NONE SEEN (ref 0–5)
WBC, UA: NONE SEEN WBC/hpf (ref 0–5)

## 2019-03-07 LAB — MAGNESIUM: Magnesium: 1.9 mg/dL (ref 1.7–2.4)

## 2019-03-07 MED ORDER — PHENYTOIN SODIUM EXTENDED 100 MG PO CAPS: 100.0000 mg | ORAL_CAPSULE | Freq: Every day | ORAL | Status: DC

## 2019-03-07 MED ORDER — IOHEXOL 350 MG/ML SOLN
100.0000 mL | Freq: Once | INTRAVENOUS | Status: AC | PRN
  Administered 2019-03-07: 100 mL via INTRAVENOUS

## 2019-03-07 MED ORDER — LISINOPRIL 20 MG PO TABS
30.0000 mg | ORAL_TABLET | Freq: Every day | ORAL | Status: DC
  Administered 2019-03-07 – 2019-03-08 (×2): 30 mg via ORAL
  Filled 2019-03-07 (×2): qty 1

## 2019-03-07 MED ORDER — LEVETIRACETAM IN NACL 500 MG/100ML IV SOLN
500.0000 mg | Freq: Once | INTRAVENOUS | Status: AC
  Administered 2019-03-07: 500 mg via INTRAVENOUS
  Filled 2019-03-07: qty 100

## 2019-03-07 MED ORDER — FERROUS SULFATE 325 (65 FE) MG PO TABS
325.0000 mg | ORAL_TABLET | Freq: Two times a day (BID) | ORAL | Status: DC
  Administered 2019-03-07 – 2019-03-08 (×2): 325 mg via ORAL
  Filled 2019-03-07 (×2): qty 1

## 2019-03-07 MED ORDER — PHENYTOIN 50 MG PO CHEW
200.0000 mg | CHEWABLE_TABLET | Freq: Every evening | ORAL | Status: DC
  Filled 2019-03-07: qty 4

## 2019-03-07 MED ORDER — PRAVASTATIN SODIUM 40 MG PO TABS: 80.0000 mg | ORAL_TABLET | Freq: Every day | ORAL | Status: DC

## 2019-03-07 MED ORDER — PHENYTOIN SODIUM EXTENDED 30 MG PO CAPS
130.0000 mg | ORAL_CAPSULE | Freq: Every morning | ORAL | Status: DC
  Administered 2019-03-08: 130 mg via ORAL
  Filled 2019-03-07 (×2): qty 1

## 2019-03-07 MED ORDER — PHENYTOIN SODIUM EXTENDED 100 MG PO CAPS
200.0000 mg | ORAL_CAPSULE | Freq: Every day | ORAL | Status: DC
  Administered 2019-03-07: 200 mg via ORAL
  Filled 2019-03-07 (×2): qty 2

## 2019-03-07 MED ORDER — LEVETIRACETAM 500 MG PO TABS
750.0000 mg | ORAL_TABLET | Freq: Two times a day (BID) | ORAL | Status: DC
  Administered 2019-03-07 – 2019-03-08 (×2): 750 mg via ORAL
  Filled 2019-03-07 (×2): qty 1

## 2019-03-07 MED ORDER — PRAVASTATIN SODIUM 40 MG PO TABS
80.0000 mg | ORAL_TABLET | Freq: Every day | ORAL | Status: DC
  Administered 2019-03-08: 80 mg via ORAL
  Filled 2019-03-07: qty 2

## 2019-03-07 MED ORDER — ACETAMINOPHEN 325 MG PO TABS: 650.0000 mg | ORAL_TABLET | Freq: Four times a day (QID) | ORAL | Status: DC | PRN

## 2019-03-07 MED ORDER — EDOXABAN TOSYLATE 30 MG PO TABS
60.0000 mg | ORAL_TABLET | ORAL | Status: DC
  Filled 2019-03-07 (×2): qty 2
  Filled 2019-03-07: qty 60

## 2019-03-07 NOTE — H&P (Signed)
Date: 03/07/2019               Patient Name:  [name] MRN: [id number]  DOB: 05/08/1934 Age / Sex: 84 y.o., male   PCP: Cyndi Bender, PA-C         Medical Service: Internal Medicine Teaching Service         Attending Physician: Dr. Evette Doffing, Mallie Mussel, *    First Contact: Dr. Court Joy Pager: [phone]  Second Contact: Dr. Eileen Stanford Pager: [phone]       After Hours (After 5p/  First Contact Pager: [phone]  weekends / holidays): Second Contact Pager: [phone]   Chief Complaint: Seizure  History of Present Illness: [name] is a 84 year old male with significant past medical history for HTN, HLD,  seizure disorder on Dilantin and recently also Keppra , saddle pulmonary embolus in 2015 on endoxaban, and prostate cancer s/p radiation therapy who presents after a witnessed seizure. Called brother for collateral but unable to reach. Patient expresses mild confusion and word finding difficulty during interview. Called patient pharmacy to see in regards to seizure regimen and he has filled both Keppra and Dilantin.  [name] reports that he was in his usual state of health until this am when apparently he was noted to have a seizure like episode by his family. He does not know triggers for his seizures, but states that usually he experiences seizures in his sleep. He reports that he has seizures about every 2 months. He was seen in August and October in our ED per chart review. On the second presentation Keppra was added to the Dilantin regimen. In regards to his pulmonary embolism, he states that he has experiencing dyspnea but its been going on for "quite a while" about a month or more. Currently, he denies chest pain and SOB. He does endorse compliance to all his medications and denies missing any doses. He denies any recent illness, abdominal pain, recent changes in medication,  but reports of urinary incontinence.    Meds:  No current facility-administered medications on file prior to  encounter.   Current Outpatient Medications on File Prior to Encounter  Medication Sig Dispense Refill  . edoxaban (SAVAYSA) 60 MG TABS tablet Take 60 mg by mouth daily. (Patient taking differently: Take 60 mg by mouth at bedtime. ) 30 tablet 11  . ferrous sulfate 325 (65 FE) MG EC tablet Take 325 mg by mouth 2 (two) times daily.   5  . levETIRAcetam (KEPPRA) 500 MG tablet Take 1 tablet (500 mg total) by mouth 2 (two) times daily. 60 tablet 0  . lisinopril (PRINIVIL,ZESTRIL) 30 MG tablet Take 30 mg by mouth daily.    . phenytoin (DILANTIN) 100 MG ER capsule Dilantin total 130 milligrams every morning. 300 mg every afternoon (Patient taking differently: Take 160-200 mg by mouth 2 (two) times daily. Dilantin total 160 milligrams every morning. 200 mg every afternoon) 100 capsule 0  . phenytoin (DILANTIN) 30 MG ER capsule Take 3 capsules (90 mg total) by mouth daily. Dilantin dosage 130 every morning. 300 every afternoon (Patient taking differently: Take 160 mg by mouth daily. Takes along with a 100 mg capsule every morning) 100 capsule o  . pravastatin (PRAVACHOL) 80 MG tablet Take 80 mg by mouth daily.      No outpatient medications have been marked as taking for the 03/07/19 encounter [facility] Encounter).     Allergies: Allergies as of 03/07/2019  . (No Known Allergies)   Past Medical  History:  Diagnosis Date  . Anemia   . Cancer [facility])    Prostate  . Cervicalgia   . Essential hypertension, benign   . History of loop recorder 07/14/2014  . Impotence of organic origin   . Insomnia   . Pulmonary embolism ([place])   . Pure hypercholesterolemia   . Saddle pulmonary embolus ([place])   . Seizures ([place])   . Type II or unspecified type diabetes mellitus without mention of complication, not stated as uncontrolled     Family History:  Family History  Problem Relation Age of Onset  . Cancer Mother   . Cancer Father       Social History: Lives with brother. 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 Social History   Tobacco Use  . Smoking status: Former Research scientist (life sciences)  . Smokeless tobacco: Never Used  Substance Use Topics  . Alcohol use: No  . Drug use: No     Review of Systems: A complete ROS was negative except as per HPI.   Physical Exam: Blood pressure (!) 154/91, pulse 93, temperature 98 F (36.7 C), temperature source Oral, resp. rate (!) 26, SpO2 93 %.  Physical Exam  Constitutional: He appears well-developed and well-nourished. No distress.  HENT:  Head: Normocephalic and atraumatic.  Tongue laceration with mild edema around area  Eyes: Pupils are equal, round, and reactive to light. Conjunctivae and EOM are normal. No scleral icterus.  Neck: No JVD present. No tracheal deviation present.  Cardiovascular: Normal rate and regular rhythm. Exam reveals no gallop and no friction rub.  No murmur heard. Respiratory: No respiratory distress. He has no wheezes. He has no rales.  GI: Bowel sounds are normal. There is no abdominal tenderness. There is no guarding.  Musculoskeletal:        General: No deformity or edema.  Neurological:  Mental Status: Patient is awake, alert, oriented to self, place, situation and to time with prompting ( Answered 2002 ).   No signs of aphasia or neglect Cranial Nerves: II: Pupils equal, round, and reactive to light.  III,IV, VI: EOMI without ptosis or diploplia.  V: Facial sensation is symmetric tolight touch andTemperature. VII: Facial movement is symmetric.  VIII: hearing is intact to voice X: Uvula elevates symmetrically XI: Shoulder shrug is symmetric. XII: tongue is midline without atrophy or fasciculations.  Motor: Good effort thorughout, at Least 5/5 bilateral UE, 5/5 bilateral lower extremitiy Sensory: Sensation is grossly intact , bilateral UEs & LEs   Skin: Skin is warm and dry.  Psychiatric: He has a normal mood and affect. His behavior is normal.    EKG: personally reviewed my interpretation is Sinus rhythm,  prolonged PR interval. No change compared to prior EKG.    CXR: personally reviewed my interpretation is appreciated loop recorder. No focal opacities, low volume xray.   CTPE on 12/11  COMPARISON:  CT PA 03/24/2014  FINDINGS: Cardiovascular: Satisfactory opacification the pulmonary arteries to the segmental level. Somewhat like opacities in the distal right main pulmonary artery with marginated filling defects seen in the segmental branches of the right lower lobe and right middle lobe favor remote emboli particularly given the region of persistent clot burden on comparison study from 2015. Mild elevation of the RV LV ratio at 1.2. Central pulmonary arteries are normal caliber. Normal heart size. No pericardial effusion. Minimal coronary atherosclerosis. Normal caliber thoracic aorta. Normal 3 vessel branching of the arch. Minimal atherosclerotic plaque of the arch and great vessels.  Mediastinum/Nodes: No  enlarged mediastinal, hilar, or axillary lymph nodes. Thyroid gland and thoracic inlet are unremarkable. Crescentic lucency seen anterolateral to the descending thoracic aorta is stable from prior and may reflect a small amount of lung parenchyma posterior to the right hilum. Distal thoracic esophagus is fluid-filled. Bowing of the trachea compatible with imaging during exhalation.  Lungs/Pleura: There are dependent atelectatic changes in the lungs. Diffuse airways thickening and scattered secretions are noted. Some patchy ground ground-glass and tree-in-bud opacity is present in the right upper lobe including a scattered part solid nodularities in the posterior right upper lobe and right middle lobe, largest measuring up to 8 mm in size (6/69) no pneumothorax. No effusion.  Upper Abdomen: No acute abnormalities present in the visualized portions of the upper abdomen.  Musculoskeletal: Multilevel degenerative changes are present in the imaged portions of the spine. No  acute osseous abnormality or suspicious osseous lesion. No suspicious chest wall lesions.  Review of the MIP images confirms the above findings.  IMPRESSION: 1. Web-like and peripheral filling defects opacities in the distal right main pulmonary artery and segmental branches of the right lower lobe and right middle lobe favor remote emboli particularly given the region of persistent clot burden on comparison study from [id number], acute embolic disease cannot be fully excluded 2. Mild elevation of the RV/LV ratio to 1.2. While this could reflect some right heart strain this is similar to only minimally increased from comparison in 2015. Could consider echocardiogram if there is persisting concern for right heart strain. 3. Patchy ground-glass and tree-in-bud opacity upper and middle lobes with more diffuse airways thickening and mucous plugging may represent an infectious or inflammatory process. 4. Scattered part solid nodularities in the right upper lobe and right middle lobe, largest measuring up to 8 mm in size. Non-contrast chest CT at 3-6 months is recommended. If nodules persist, subsequent management will be based upon the most suspicious nodule(s). This recommendation follows the consensus statement: Guidelines for Management of Incidental Pulmonary Nodules Detected on CT Images: From the Fleischner Society 2017; Radiology 2017; 284:228-243. 5. Aortic Atherosclerosis (ICD10-I70.0).  Assessment & Plan by Problem: Active Problems:   Breakthrough seizure ([place])  Erric Balsiger is a 84 year old male with significant PMhx for HTN, HLD, seizure disorder on Dilantin and recently also Keppra, saddle pulmonary embolus in 2015 on endoxaban, and prostate cancer s/p radiation therapy who presents after a witnessed seizure.  #Acute encephalopathy Patient presents after witness seizure in likely a post ictal state. No acute findings on CT Head. No evidence of metabolic, infectious or toxic causes.  Will check Mg.  Dilantin level low-normal.  - Dilantin 130 am - Dilantin 200 mg qhs - Kepra 750 mg BID  #Acute hypoxic respiratory failure  Noted to have desatted per ED report. Tachypnea appreciated on review of vitals, but sating well on room air. Breathing comfortably on exam. Denies acute change but patient is post ictal.CT angio pretty much unchanged with minimally elevated RV/LV ratio. Bowing of the trachea noted on CT with expiration. Could explain brief moment of desaturation reported.  - TTE - Continuous Pulse Ox -Continue edoxaban -If TTE shows significant heart strain plan to start Heparin.   Dispo: Admit patient to Observation with expected length of stay less than 2 midnights.  Signed: Tamsen Snider, MD PGY1  [phone]

## 2019-03-07 NOTE — Plan of Care (Signed)
  Problem: Safety: Goal: Ability to remain free from injury will improve Outcome: Progressing   

## 2019-03-07 NOTE — ED Notes (Signed)
Unable to give report before no fly zone.

## 2019-03-07 NOTE — Progress Notes (Signed)
New Admission Note: ? Arrival Method: Stretcher Mental Orientation: Alert and Oriented x4 Telemetry: Box [id number] Assessment: Completed Skin: Refer to flowsheet IV: Left Antecubital Pain: 5/10  Tubes: Condom Catheter Safety Measures: Safety Fall Prevention Plan discussed with patient. Admission: Completed 5 [place] Orientation: Patient has been orientated to the room, unit and the staff. Family: None at the bedside at this time Orders have been reviewed and are being implemented. Will continue to monitor the patient. Call light has been placed within reach and bed alarm has been activated.  ? Milagros Loll, RN  Phone Number: [phone]

## 2019-03-07 NOTE — ED Notes (Signed)
EEG being performed at bedside

## 2019-03-07 NOTE — ED Notes (Signed)
Patient transported to CT 

## 2019-03-07 NOTE — ED Provider Notes (Signed)
[facility] EMERGENCY DEPARTMENT Provider Note   CSN: [id number] Arrival date & time: 03/07/19  [id number]     History Chief Complaint  Patient presents with  . Seizures    [name] is a 84 y.o. male.  HPI Level 5 caveat due to altered mental status.  Patient presented after fall and reported seizure.  Reportedly family heard a thud in the bathroom and found him on the floor with a seizure for minutes.  Reportedly was "spaced out".  Previous history of seizures.  Is on edoxaban for pulmonary embolisms.  History of seizures but unknown compliance.  Patient still somewhat confused and cannot really provide much history.    Past Medical History:  Diagnosis Date  . Anemia   . Cancer [facility])    Prostate  . Cervicalgia   . Essential hypertension, benign   . History of loop recorder 07/14/2014  . Impotence of organic origin   . Insomnia   . Pulmonary embolism ([place])   . Pure hypercholesterolemia   . Saddle pulmonary embolus ([place])   . Seizures ([place])   . Type II or unspecified type diabetes mellitus without mention of complication, not stated as uncontrolled     Patient Active Problem List   Diagnosis Date Noted  . Acute encephalopathy 05/17/2015  . Pyrexia   . UTI (lower urinary tract infection) 05/16/2015  . Syncope 07/14/2014  . History of loop recorder 07/14/2014  . Chronic anticoagulation 03/26/2014  . Dyslipidemia 03/24/2014  . Hx pulmonary embolism 10/25/2013  . HTN (hypertension) 10/25/2013  . Seizures ([place]) 09/10/2012    Past Surgical History:  Procedure Laterality Date  . LOOP RECORDER IMPLANT N/A 03/26/2014   Procedure: LOOP RECORDER IMPLANT;  Surgeon: Sanda Klein, MD;  Location: [facility] CATH LAB;  Service: Cardiovascular;  Laterality: N/A;       Family History  Problem Relation Age of Onset  . Cancer Mother   . Cancer Father     Social History   Tobacco Use  . Smoking status: Former Research scientist (life sciences)  . Smokeless tobacco: Never Used  Substance Use  Topics  . Alcohol use: No  . Drug use: No    Home Medications Prior to Admission medications   Medication Sig Start Date End Date Taking? Authorizing Provider  edoxaban (SAVAYSA) 60 MG TABS tablet Take 60 mg by mouth daily. Patient taking differently: Take 60 mg by mouth at bedtime.  03/26/14   Burns, Arloa Koh, MD  ferrous sulfate 325 (65 FE) MG EC tablet Take 325 mg by mouth 2 (two) times daily.  08/14/17   [provider]  levETIRAcetam (KEPPRA) 500 MG tablet Take 1 tablet (500 mg total) by mouth 2 (two) times daily. 01/02/19   Gareth Morgan, MD  lisinopril (PRINIVIL,ZESTRIL) 30 MG tablet Take 30 mg by mouth daily.    [provider]  phenytoin (DILANTIN) 100 MG ER capsule Dilantin total 130 milligrams every morning. 300 mg every afternoon Patient taking differently: Take 160-200 mg by mouth 2 (two) times daily. Dilantin total 160 milligrams every morning. 200 mg every afternoon 10/04/16   Tanna Furry, MD  phenytoin (DILANTIN) 30 MG ER capsule Take 3 capsules (90 mg total) by mouth daily. Dilantin dosage 130 every morning. 300 every afternoon Patient taking differently: Take 160 mg by mouth daily. Takes along with a 100 mg capsule every morning 10/04/16   Tanna Furry, MD  pravastatin (PRAVACHOL) 80 MG tablet Take 80 mg by mouth daily. 09/26/18   [provider]  Allergies    Patient has no known allergies.  Review of Systems   Review of Systems  Unable to perform ROS: Mental status change    Physical Exam Updated Vital Signs BP (!) 174/97   Pulse 94   Temp 98 F (36.7 C) (Oral)   Resp (!) 29   SpO2 93%   Physical Exam Vitals and nursing note reviewed.  HENT:     Head: Normocephalic and atraumatic.     Mouth/Throat:     Comments: Patient has apparent bite mark on tongue. Eyes:     Pupils: Pupils are equal, round, and reactive to light.  Cardiovascular:     Rate and Rhythm: Regular rhythm.  Pulmonary:     Breath sounds: No wheezing or rhonchi.    Abdominal:     Tenderness: There is no abdominal tenderness.  Musculoskeletal:        General: No tenderness.     Cervical back: Neck supple.  Skin:    General: Skin is warm.     Capillary Refill: Capillary refill takes less than 2 seconds.  Neurological:     Mental Status: He is alert.     Comments: Awake and pleasant but somewhat confused.  Unable to tell me if he has had his medicines today or yesterday.  Does not really remember what happened but states he feels little bad.     ED Results / Procedures / Treatments   Labs (all labs ordered are listed, but only abnormal results are displayed) Labs Reviewed  BASIC METABOLIC PANEL - Abnormal; Notable for the following components:      Result Value   Glucose, Bld 180 (*)    Calcium 8.6 (*)    GFR calc non Af Amer 54 (*)    All other components within normal limits  URINALYSIS, ROUTINE W REFLEX MICROSCOPIC - Abnormal; Notable for the following components:   APPearance CLOUDY (*)    Hgb urine dipstick SMALL (*)    All other components within normal limits  SARS CORONAVIRUS 2 (TAT 6-24 HRS)  PHENYTOIN LEVEL, TOTAL  CBC  RAPID URINE DRUG SCREEN, HOSP PERFORMED  URINALYSIS, MICROSCOPIC (REFLEX)    EKG None  Radiology CT Head Wo Contrast  Result Date: 03/07/2019 CLINICAL DATA:  Minor head trauma.  Seizure. EXAM: CT HEAD WITHOUT CONTRAST TECHNIQUE: Contiguous axial images were obtained from the base of the skull through the vertex without intravenous contrast. COMPARISON:  10/04/2016 FINDINGS: Brain: No evidence of acute infarction, hemorrhage, hydrocephalus, extra-axial collection or mass lesion/mass effect. Cerebral volume loss and chronic small vessel ischemic change in keeping with age. Vascular: No hyperdense vessel.  Atherosclerotic calcification. Skull: No acute or aggressive finding. Sinuses/Orbits: Right cataract resection. No significant sinus disease. IMPRESSION: Aging brain without acute finding. Electronically Signed    By: Monte Fantasia M.D.   On: 03/07/2019 07:50   CT Angio Chest PE W and/or Wo Contrast  Result Date: 03/07/2019 CLINICAL DATA:  Unwitnessed fall, reported seizure EXAM: CT ANGIOGRAPHY CHEST WITH CONTRAST TECHNIQUE: Multidetector CT imaging of the chest was performed using the standard protocol during bolus administration of intravenous contrast. Multiplanar CT image reconstructions and MIPs were obtained to evaluate the vascular anatomy. CONTRAST:  164mL OMNIPAQUE IOHEXOL 350 MG/ML SOLN COMPARISON:  CT PA 03/24/2014 FINDINGS: Cardiovascular: Satisfactory opacification the pulmonary arteries to the segmental level. Somewhat like opacities in the distal right main pulmonary artery with marginated filling defects seen in the segmental branches of the right lower lobe and right middle lobe  favor remote emboli particularly given the region of persistent clot burden on comparison study from 2015. Mild elevation of the RV LV ratio at 1.2. Central pulmonary arteries are normal caliber. Normal heart size. No pericardial effusion. Minimal coronary atherosclerosis. Normal caliber thoracic aorta. Normal 3 vessel branching of the arch. Minimal atherosclerotic plaque of the arch and great vessels. Mediastinum/Nodes: No enlarged mediastinal, hilar, or axillary lymph nodes. Thyroid gland and thoracic inlet are unremarkable. Crescentic lucency seen anterolateral to the descending thoracic aorta is stable from prior and may reflect a small amount of lung parenchyma posterior to the right hilum. Distal thoracic esophagus is fluid-filled. Bowing of the trachea compatible with imaging during exhalation. Lungs/Pleura: There are dependent atelectatic changes in the lungs. Diffuse airways thickening and scattered secretions are noted. Some patchy ground ground-glass and tree-in-bud opacity is present in the right upper lobe including a scattered part solid nodularities in the posterior right upper lobe and right middle lobe,  largest measuring up to 8 mm in size (6/69) no pneumothorax. No effusion. Upper Abdomen: No acute abnormalities present in the visualized portions of the upper abdomen. Musculoskeletal: Multilevel degenerative changes are present in the imaged portions of the spine. No acute osseous abnormality or suspicious osseous lesion. No suspicious chest wall lesions. Review of the MIP images confirms the above findings. IMPRESSION: 1. Web-like and peripheral filling defects opacities in the distal right main pulmonary artery and segmental branches of the right lower lobe and right middle lobe favor remote emboli particularly given the region of persistent clot burden on comparison study from 123456, acute embolic disease cannot be fully excluded 2. Mild elevation of the RV/LV ratio to 1.2. While this could reflect some right heart strain this is similar to only minimally increased from comparison in 2015. Could consider echocardiogram if there is persisting concern for right heart strain. 3. Patchy ground-glass and tree-in-bud opacity upper and middle lobes with more diffuse airways thickening and mucous plugging may represent an infectious or inflammatory process. 4. Scattered part solid nodularities in the right upper lobe and right middle lobe, largest measuring up to 8 mm in size. Non-contrast chest CT at 3-6 months is recommended. If nodules persist, subsequent management will be based upon the most suspicious nodule(s). This recommendation follows the consensus statement: Guidelines for Management of Incidental Pulmonary Nodules Detected on CT Images: From the Fleischner Society 2017; Radiology 2017; 284:228-243. 5. Aortic Atherosclerosis (ICD10-I70.0). These results were called by telephone at the time of interpretation on 03/07/2019 at 2:06 pm to provider [facility] , who verbally acknowledged these results. Electronically Signed   By: Lovena Le M.D.   On: 03/07/2019 14:01   DG Chest Portable 1 View  Result  Date: 03/07/2019 CLINICAL DATA:  Altered mental status, seizure EXAM: PORTABLE CHEST 1 VIEW COMPARISON:  Chest x-ray of 10/05/2018 FINDINGS: Stable appearance of cardiomediastinal contours with loop recorder projecting over left heart border. Subtle added density in the right middle lobe and right lower lobe. Left lung is clear. Low volume chest with buckling of the trachea. No acute bone process. Marked glenohumeral degenerative changes. IMPRESSION: 1. Subtle added density in the right middle lobe and right lower lobe, suspicious for pneumonia. Electronically Signed   By: Zetta Bills M.D.   On: 03/07/2019 10:26    Procedures Procedures (including critical care time)  Medications Ordered in ED Medications  levETIRAcetam (KEPPRA) IVPB 500 mg/100 mL premix ( Intravenous Stopped 03/07/19 0945)  iohexol (OMNIPAQUE) 350 MG/ML injection 100 mL (100 mLs Intravenous  Contrast Given 03/07/19 1346)    ED Course  I have reviewed the triage vital signs and the nursing notes.  Pertinent labs & imaging results that were available during my care of the patient were reviewed by me and considered in my medical decision making (see chart for details).    MDM Rules/Calculators/A&P     CHA2DS2/VAS Stroke Risk Points      N/A >= 2 Points: High Risk  1 - 1.99 Points: Medium Risk  0 Points: Low Risk    A final score could not be computed because of missing components.: Last  Change: N/A     This score determines the patient's risk of having a stroke if the  patient has atrial fibrillation.      This score is not applicable to this patient. Components are not  calculated.                   Patient presented with likely seizure.  Was witnessed last around 4 minutes.  Was more confused after however has not come back to baseline like he usually does.  Still improving on the ER but not back to baseline.  Has a normal Dilantin level but loaded with 500 mg of Keppra.  However patient was found to be hypoxic.   Sats went down to the 80s at rest.  X-ray reassuring.  CT scan done due to previous pulmonary embolisms that showed likely chronic PEs but could not rule out acute on the same area.  Does have some right heart strain potentially on the CT scan.  Also some bronchitis but not frank Covid type findings.  With the mental status change being slower and hypoxia feels the patient benefit from [place] the hospital.  Has Cyndi Bender, PA as his primary.  Will discuss with unassigned medicine. Final Clinical Impression(s) / ED Diagnoses Final diagnoses:  Seizure ([place])  Syncope, unspecified syncope type    Rx / DC Orders ED Discharge Orders    None       Davonna Belling, MD 03/07/19 1430

## 2019-03-07 NOTE — ED Triage Notes (Signed)
Pt arrives from home via [facility] Pt family heard a thud from the bathroom at 0600 when pt had seizure that lasted 4 min. Per family pt had a "spacing out" seizure. Pt appears to sustain no trauma from fall. Pt has hx of seizures with his previous seizure being in oct. Compliance with medication is unknown.

## 2019-03-08 ENCOUNTER — Observation Stay (HOSPITAL_BASED_OUTPATIENT_CLINIC_OR_DEPARTMENT_OTHER): Payer: Medicare Other

## 2019-03-08 DIAGNOSIS — Z7901 Long term (current) use of anticoagulants: Secondary | ICD-10-CM | POA: Diagnosis not present

## 2019-03-08 DIAGNOSIS — I361 Nonrheumatic tricuspid (valve) insufficiency: Secondary | ICD-10-CM | POA: Diagnosis not present

## 2019-03-08 DIAGNOSIS — Z86711 Personal history of pulmonary embolism: Secondary | ICD-10-CM

## 2019-03-08 DIAGNOSIS — G40909 Epilepsy, unspecified, not intractable, without status epilepticus: Principal | ICD-10-CM

## 2019-03-08 DIAGNOSIS — Z9114 Patient's other noncompliance with medication regimen: Secondary | ICD-10-CM | POA: Diagnosis not present

## 2019-03-08 DIAGNOSIS — Z79899 Other long term (current) drug therapy: Secondary | ICD-10-CM | POA: Diagnosis not present

## 2019-03-08 LAB — BASIC METABOLIC PANEL
Anion gap: 11 (ref 5–15)
BUN: 10 mg/dL (ref 8–23)
CO2: 24 mmol/L (ref 22–32)
Calcium: 9 mg/dL (ref 8.9–10.3)
Chloride: 107 mmol/L (ref 98–111)
Creatinine, Ser: 0.98 mg/dL (ref 0.61–1.24)
GFR calc Af Amer: >60 mL/min (ref >60)
GFR calc non Af Amer: >60 mL/min (ref >60)
Glucose, Bld: 112 mg/dL — ABNORMAL HIGH (ref 70–99)
Potassium: 3.6 mmol/L (ref 3.5–5.1)
Sodium: 142 mmol/L (ref 135–145)

## 2019-03-08 LAB — CBC
HCT: 40.7 % (ref 39.0–52.0)
Hemoglobin: 13.6 g/dL (ref 13.0–17.0)
MCH: 32.9 pg (ref 26.0–34.0)
MCHC: 33.4 g/dL (ref 30.0–36.0)
MCV: 98.3 fL (ref 80.0–100.0)
Platelets: 171 10*3/uL (ref 150–400)
RBC: 4.14 MIL/uL — ABNORMAL LOW (ref 4.22–5.81)
RDW: 13 % (ref 11.5–15.5)
WBC: 8.2 10*3/uL (ref 4.0–10.5)
nRBC: 0 % (ref 0.0–0.2)

## 2019-03-08 LAB — ECHOCARDIOGRAM COMPLETE

## 2019-03-08 MED ORDER — PHENYTOIN SODIUM EXTENDED 100 MG PO CAPS: ORAL_CAPSULE | ORAL | 0 refills | Status: DC

## 2019-03-08 MED ORDER — LEVETIRACETAM 750 MG PO TABS: 750.0000 mg | ORAL_TABLET | Freq: Two times a day (BID) | ORAL | 3 refills | Status: DC

## 2019-03-08 MED ORDER — PHENYTOIN SODIUM EXTENDED 30 MG PO CAPS: 100.0000 mg | ORAL_CAPSULE | Freq: Every day | ORAL | Status: DC

## 2019-03-08 MED ORDER — PHENYTOIN SODIUM EXTENDED 30 MG PO CAPS: 160.0000 mg | ORAL_CAPSULE | Freq: Every morning | ORAL | Status: DC

## 2019-03-08 NOTE — Progress Notes (Signed)
Avaan Vink to be discharged Home per MD order. Discussed prescriptions and follow up appointments with the patient. Medication list explained in detail. Patient verbalized understanding.  Skin clean, dry and intact without evidence of skin break down, no evidence of skin tears noted. IV catheter discontinued intact. Site without signs and symptoms of complications. Dressing and pressure applied. Pt denies pain at the site currently. No complaints noted.  Patient free of lines, drains, and wounds.   An After Visit Summary (AVS) was printed and given to the patient. Patient escorted via wheelchair, and discharged home via private auto.  Amaryllis Dyke, RN

## 2019-03-08 NOTE — Progress Notes (Signed)
  Echocardiogram 2D Echocardiogram has been performed.  [name] 03/08/2019, 9:08 AM

## 2019-03-08 NOTE — Progress Notes (Signed)
Tried to reach patient's brother to notify about discharge x 2, phone keeps ringing, no voicemail setup.  According to the patient brother called him so he notified him regarding the discharge.

## 2019-03-08 NOTE — Progress Notes (Signed)
SLP Cancellation Note  Patient Details Name: [name] MRN: [id number] DOB: [birth year]   Cancelled treatment: Pt passed [facility] swallow screen, therefore no SLP swallow eval warranted per protocol.    [name] L. Tivis Ringer, [place] CCC/SLP Acute Rehabilitation Services Office number [phone] Pager [phone]           [name] 03/08/2019, 10:41 AM

## 2019-03-08 NOTE — Progress Notes (Signed)
   Subjective: Patient has resting in bed this morning reports he is feeling back to normal.  He ran out of his medication last week and he had no refills when he went to pharmacy. His PCP sent prescription after he notified him, but the patient reports he had a seizure before picking up medication.  In addition patient reports he had a neurology appointment this past month to establish care, but he was unable to make it.  The importance of following up with his PCP was discussed and picking up his prescriptions today.   Objective:  Vital signs in last 24 hours: Vitals:   03/07/19 1900 03/07/19 1930 03/07/19 2007 03/08/19 0515  BP:  (!) 152/89 (!) 161/93 134/86  Pulse:   88 86  Resp:   18 18  Temp: 98 F (36.7 C)  99.2 F (37.3 C) 98.9 F (37.2 C)  TempSrc:   Oral   SpO2:   95% 94%   Physical Exam Constitutional:      Appearance: Normal appearance. He is not ill-appearing.  Cardiovascular:     Rate and Rhythm: Normal rate and regular rhythm.     Heart sounds: No murmur. No friction rub. No gallop.   Neurological:     Mental Status: He is alert and oriented to person, place, and time.      Assessment/Plan:  Active Problems:   Breakthrough seizure ([place])  Coagulase 66-year-old male with significant history of seizure disorder on antiepileptics and history of PE on edoxaban who presents after a witnessed seizure secondary to running out of antiepileptic medication.  #Seizure disorder Patient is out of his postictal state reports he ran out of his medication.   -Continue Dilantin and Keppra  Dispo: Anticipated discharge in approximately today   Tamsen Snider, MD PGY1  [phone]

## 2019-03-08 NOTE — Discharge Summary (Addendum)
Name: [name] MRN: [id number] DOB: 12-08-1934 84 y.o. PCP: Cyndi Bender, PA-C  Date of Admission: 03/07/2019  6:51 AM Date of Discharge: 03/08/2019 Attending Physician: Lalla Brothers T  Discharge Diagnosis: 1. Epilepsy  Discharge Medications: Allergies as of 03/08/2019   No Known Allergies      Medication List     TAKE these medications    edoxaban 60 MG Tabs tablet Commonly known as: SAVAYSA Take 60 mg by mouth daily. What changed: when to take this   ferrous sulfate 325 (65 FE) MG EC tablet Take 325 mg by mouth 2 (two) times daily.   levETIRAcetam 750 MG tablet Commonly known as: KEPPRA Take 1 tablet (750 mg total) by mouth 2 (two) times daily. What changed:  medication strength how much to take   lisinopril 30 MG tablet Commonly known as: ZESTRIL Take 30 mg by mouth daily.   phenytoin 100 MG ER capsule Commonly known as: DILANTIN Dilantin total 160 milligrams every morning. 200 mg every afternoon What changed: additional instructions   phenytoin 30 MG ER capsule Commonly known as: DILANTIN Take 3 capsules (90 mg total) by mouth daily. Dilantin dosage 160 every morning. 200 every afternoon What changed: additional instructions   pravastatin 80 MG tablet Commonly known as: PRAVACHOL Take 80 mg by mouth at bedtime.        Disposition and follow-up:   [name] was discharged from [facility] in Stable condition.  At the hospital follow up visit please address:  1.  Epilepsy  - Patient missed last referral to neurology  - Ran out of antiepeliptics and presented in post ictal state after witnessed seizure  - Continued home regimen on discharge. Phenytoin 160 in am, 200 in pm. Keppra 500 mg BID.   2.  Labs / imaging needed at time of follow-up:   3.  Pending labs/ test needing follow-up:   Follow-up Appointments:    Hospital Course by problem list: 1. History of seizure disorder on antiepileptics presented after  witnessed seizure. Neurology evaluated the patient, determined likely cause of seizure was non-adherence to antiepileptic medications. Patient was loaded with Keppra and continued on oral dosing. Mental status improved on day 2 and and he returned to his usual functional state. We discussed importance of notifying his PCP before he runs out of medications in the future. We discussed not driving for at least 6 months which he agrees to.     Discharge Vitals:   BP 128/78 (BP Location: Right Arm)   Pulse 89   Temp 98.6 F (37 C) (Oral)   Resp 18   SpO2 98%   Pertinent Labs, Studies, and Procedures:  CBC Latest Ref Rng & Units 03/08/2019 03/07/2019 01/02/2019  WBC 4.0 - 10.5 K/uL 8.2 10.4 7.3  Hemoglobin 13.0 - 17.0 g/dL 13.6 14.3 14.7  Hematocrit 39.0 - 52.0 % 40.7 42.8 45.3  Platelets 150 - 400 K/uL 171 185 204   BMP Latest Ref Rng & Units 03/08/2019 03/07/2019 01/02/2019  Glucose 70 - 99 mg/dL 112(H) 180(H) 186(H)  BUN 8 - 23 mg/dL 10 10 8   Creatinine 0.61 - 1.24 mg/dL 0.98 1.23 0.83  Sodium 135 - 145 mmol/L 142 141 139  Potassium 3.5 - 5.1 mmol/L 3.6 3.9 3.9  Chloride 98 - 111 mmol/L 107 108 103  CO2 22 - 32 mmol/L 24 23 25   Calcium 8.9 - 10.3 mg/dL 9.0 8.6(L) 8.5(L)   Phenytoin level : 12.7  Discharge Instructions: Discharge Instructions  Diet - low sodium heart healthy   Complete by: As directed    Discharge instructions   Complete by: As directed    Mr. Dubon,  It was a pleasure taking care of you here in the hospital.  You were admitted because of seizures.  This was due to you missing several doses of your medications.  We started you on IV seizure medication and also gave you oral seizure medications as well.  As we discussed, please go straight to the pharmacy to pick up your medication in [place].   Per [facility] statutes, patients with seizures are not allowed to drive until they have been seizure-free for six months.   Use caution when using heavy  equipment or power tools. Avoid working on ladders or at heights. Take showers instead of baths. Ensure the water temperature is not too high on the home water heater. Do not go swimming alone. Do not lock yourself in a room alone (i.e. bathroom). When caring for infants or small children, sit down when holding, feeding, or changing them to minimize risk of injury to the child in the event you have a seizure. Maintain good sleep hygiene. Avoid alcohol.    If patient has another seizure, call 911 and bring them back to the ED if: A.  The seizure lasts longer than 5 minutes.      B.  The patient doesn't wake shortly after the seizure or has new problems such as difficulty seeing, speaking or moving following the seizure C.  The patient was injured during the seizure D.  The patient has a temperature over 102 F (39C) E.  The patient vomited during the seizure and now is having trouble breathing  Take care!   Increase activity slowly   Complete by: As directed        Signed:  Tamsen Snider, MD PGY1  [phone]

## 2019-03-08 NOTE — Progress Notes (Addendum)
MD was called regarding Dilantin dosage, Dr. Court Joy came and explained to the patient dosages 160 mg morning and 200 mg in the night, patient was able to repeat back and verbalized understanding.

## 2019-03-24 ENCOUNTER — Ambulatory Visit (INDEPENDENT_AMBULATORY_CARE_PROVIDER_SITE_OTHER): Payer: Medicare Other | Admitting: Neurology

## 2019-03-24 ENCOUNTER — Encounter

## 2019-03-24 ENCOUNTER — Encounter: Payer: Self-pay | Admitting: Neurology

## 2019-03-24 ENCOUNTER — Other Ambulatory Visit: Payer: Self-pay

## 2019-03-24 VITALS — BP 155/93 | HR 104 | Temp 97.7°F | Ht 72.0 in | Wt 182.0 lb

## 2019-03-24 DIAGNOSIS — R569 Unspecified convulsions: Secondary | ICD-10-CM

## 2019-03-24 MED ORDER — LEVETIRACETAM 750 MG PO TABS: 1500.0000 mg | ORAL_TABLET | Freq: Two times a day (BID) | ORAL | 4 refills | Status: DC

## 2019-03-24 NOTE — Patient Instructions (Signed)
Week Dilantin Keppra 750mg   1st week 2 of 100mg  every night 2 tablets twice a day  2nd week 100mg  every night 2 tablets twice a day  3rd week Stop  2 tablets twice a day

## 2019-03-24 NOTE — Progress Notes (Signed)
PATIENT: [name] DOB: Apr 04, 1934  Chief Complaint  Patient presents with  . Seizures    Last seen 09/10/2012.  He is here following his ED visit for a seizure event on 03/07/2019.  Says he is now taking Dilantin 160mg  in am and 200mg  in pm, along with Keppra 750mg  BID.  No further issues. He has not been driving since the incident.  Marland Kitchen PCP    Cyndi Bender, PA-C     HISTORICAL  [name] is a 84 years old man, seen in request by his primary care PA Cyndi Bender for evaluation of seizure, he was discharged from hospital on March 08, 2019 for recurrent seizure  I saw him initially in June 2014, but has lost follow-up since then,  He had a past medical history of hypertension, hyperlipidemia, diabetes, GERD multiple recurrent seizures,  He woke up one day in May 2014, chewed his tongue, achy all over, was told he had a seizure, he lives with his brother, who also suffered complex partial [name], Anthony ( Feb 9th 1939) is a patient of ours  This is not his first episode, initial one was in 2009, he woke up on his way to hospital on the ambulance, he was found by his brother having a seizure,  He also had one minor episode in April 2014, he woke up with tongue biting, suspicious he might have a seizure as well,  He never had daytime passing out, No history of head trauma, or stroke,  MRI of the brain in December 2012 without contrast for evaluation of seizure-like activity, reported atrophy, small vessel disease, no contrast was given,  EEG in 2012 was normal  He had recurrent seizure on March 07, 2019 during his sleep, woke up at the hospital, home regimen was Dilantin 160 in the morning, 200 at night, Keppra 500 mg twice daily,  Laboratory in December 2020, normal CBC, hemoglobin of 13.6, BMP, glucose of 112, Dilantin level was 12.7, UDS was negative  I personally reviewed CT head without contrast December 2020, generalized atrophy, supratentorium small vessel  disease.  He is now taking Dilantin 30 mg +100 mg, Keppra 750 mg twice a day  REVIEW OF SYSTEMS: Full 14 system review of systems performed and notable only for as above All other review of systems were negative.  ALLERGIES: No Known Allergies  HOME MEDICATIONS: Current Outpatient Medications  Medication Sig Dispense Refill  . edoxaban (SAVAYSA) 60 MG TABS tablet Take 60 mg by mouth daily. (Patient taking differently: Take 60 mg by mouth daily. ) 30 tablet 11  . ferrous sulfate 325 (65 FE) MG EC tablet Take 325 mg by mouth 2 (two) times daily.   5  . levETIRAcetam (KEPPRA) 750 MG tablet Take 1 tablet (750 mg total) by mouth 2 (two) times daily. 120 tablet 3  . lisinopril (PRINIVIL,ZESTRIL) 30 MG tablet Take 30 mg by mouth daily.    . phenytoin (DILANTIN) 100 MG ER capsule Dilantin total 160 milligrams every morning. 200 mg every afternoon 100 capsule 0  . phenytoin (DILANTIN) 30 MG ER capsule Take 3 capsules (90 mg total) by mouth daily. Dilantin dosage 160 every morning. 200 every afternoon 100 capsule o  . pravastatin (PRAVACHOL) 80 MG tablet Take 80 mg by mouth at bedtime.      No current facility-administered medications for this visit.    PAST MEDICAL HISTORY: Past Medical History:  Diagnosis Date  . Anemia   . Cancer [facility])    Prostate  .  Cervicalgia   . Essential hypertension, benign   . History of loop recorder 07/14/2014  . Impotence of organic origin   . Insomnia   . Pulmonary embolism ([place])   . Pure hypercholesterolemia   . Saddle pulmonary embolus ([place])   . Seizures ([place])   . Type II or unspecified type diabetes mellitus without mention of complication, not stated as uncontrolled     PAST SURGICAL HISTORY: Past Surgical History:  Procedure Laterality Date  . LOOP RECORDER IMPLANT N/A 03/26/2014   Procedure: LOOP RECORDER IMPLANT;  Surgeon: Sanda Klein, MD;  Location: [facility];  Service: Cardiovascular;  Laterality: N/A;    FAMILY HISTORY: Family  History  Problem Relation Age of Onset  . Cancer Mother   . Cancer Father     SOCIAL HISTORY: Social History   Socioeconomic History  . Marital status: Single    Spouse name: Not on file  . Number of children: Not on file  . Years of education: 42  . Highest education level: Not on file  Occupational History  . Not on file  Tobacco Use  . Smoking status: Former Research scientist (life sciences)  . Smokeless tobacco: Never Used  Substance and Sexual Activity  . Alcohol use: No  . Drug use: No  . Sexual activity: Not on file  Other Topics Concern  . Not on file  Social History Narrative   He lives with his brother, retired, driving, no smoking, no drinking.   Social Determinants of Health   Financial Resource Strain:   . Difficulty of Paying Living Expenses: Not on file  Food Insecurity:   . Worried About Charity fundraiser in the Last Year: Not on file  . Ran Out of Food in the Last Year: Not on file  Transportation Needs:   . Lack of Transportation (Medical): Not on file  . Lack of Transportation (Non-Medical): Not on file  Physical Activity:   . Days of Exercise per Week: Not on file  . Minutes of Exercise per Session: Not on file  Stress:   . Feeling of Stress : Not on file  Social Connections:   . Frequency of Communication with Friends and Family: Not on file  . Frequency of Social Gatherings with Friends and Family: Not on file  . Attends Religious Services: Not on file  . Active Member of Clubs or Organizations: Not on file  . Attends Archivist Meetings: Not on file  . Marital Status: Not on file  Intimate Partner Violence:   . Fear of Current or Ex-Partner: Not on file  . Emotionally Abused: Not on file  . Physically Abused: Not on file  . Sexually Abused: Not on file     PHYSICAL EXAM   Vitals:   03/24/19 1247  BP: (!) 155/93  Pulse: (!) 104  Temp: 97.7 F (36.5 C)  Weight: 182 lb (82.6 kg)  Height: 6' (1.829 m)    Not recorded      Body mass index  is 24.68 kg/m.  PHYSICAL EXAMNIATION:  Gen: NAD, conversant, well nourised, well groomed                     Cardiovascular: Regular rate rhythm, no peripheral edema, warm, nontender. Eyes: Conjunctivae clear without exudates or hemorrhage Neck: Supple, no carotid bruits. Pulmonary: Clear to auscultation bilaterally   NEUROLOGICAL EXAM:  MENTAL STATUS: Speech:    Speech is normal; fluent and spontaneous with normal comprehension.  Cognition:  Orientation to time, place and person     Normal recent and remote memory     Normal Attention span and concentration     Normal Language, naming, repeating,spontaneous speech     Fund of knowledge   CRANIAL NERVES: CN II: Visual fields are full to confrontation. Pupils are round equal and briskly reactive to light. CN III, IV, VI: extraocular movement are normal. No ptosis. CN V: Facial sensation is intact to light touch CN VII: Face is symmetric with normal eye closure  CN VIII: Hearing is normal to causal conversation. CN IX, X: Phonation is normal. CN XI: Head turning and shoulder shrug are intact  MOTOR: There is no pronator drift of out-stretched arms. Muscle bulk and tone are normal. Muscle strength is normal.  REFLEXES: Reflexes are 2+ and symmetric at the biceps, triceps, knees, and ankles. Plantar responses are flexor.  SENSORY: Intact to light touch, pinprick and vibratory sensation are intact in fingers and toes.  COORDINATION: There is no trunk or limb dysmetria noted.  GAIT/STANCE: Dragging his right leg, he contributed to his previous right leg wound, right foot deformity   DIAGNOSTIC DATA (LABS, IMAGING, TESTING) - I reviewed patient records, labs, notes, testing and imaging myself where available.   ASSESSMENT AND PLAN  [name] is a 84 y.o. male   Epilepsy  Most recent seizure was on March 07, 2019  Will increase Keppra to 750 mg 2 tablets twice a day, stop Dilantin  Repeat EEG      Marcial Pacas,  M.D. Ph.D.  [facility] [address] [place], [place] [address] Ph: [phone] Fax: [phone]  CC: Referring Provider

## 2019-04-07 ENCOUNTER — Other Ambulatory Visit: Payer: Self-pay

## 2019-04-07 ENCOUNTER — Ambulatory Visit (INDEPENDENT_AMBULATORY_CARE_PROVIDER_SITE_OTHER): Payer: Medicare Other

## 2019-04-07 DIAGNOSIS — R569 Unspecified convulsions: Secondary | ICD-10-CM

## 2019-04-11 ENCOUNTER — Telehealth (HOSPITAL_COMMUNITY): Payer: Self-pay | Admitting: Neurology

## 2019-04-11 ENCOUNTER — Other Ambulatory Visit (HOSPITAL_COMMUNITY): Payer: Self-pay | Admitting: Neurology

## 2019-04-11 MED ORDER — LEVETIRACETAM 750 MG PO TABS: 1500.0000 mg | ORAL_TABLET | Freq: Two times a day (BID) | ORAL | 4 refills | Status: DC

## 2019-04-11 NOTE — Telephone Encounter (Signed)
I returned call from [facility] pharmacist verfying that patient`s intended dose of keppra was 750 mg two tablets twice daily.he requested updated prescription

## 2019-05-01 DIAGNOSIS — R519 Headache, unspecified: Secondary | ICD-10-CM | POA: Diagnosis not present

## 2019-05-01 DIAGNOSIS — H532 Diplopia: Secondary | ICD-10-CM | POA: Diagnosis not present

## 2019-05-01 DIAGNOSIS — H499 Unspecified paralytic strabismus: Secondary | ICD-10-CM | POA: Diagnosis not present

## 2019-05-01 DIAGNOSIS — G40909 Epilepsy, unspecified, not intractable, without status epilepticus: Secondary | ICD-10-CM | POA: Diagnosis not present

## 2019-05-01 DIAGNOSIS — R634 Abnormal weight loss: Secondary | ICD-10-CM | POA: Diagnosis not present

## 2019-05-01 DIAGNOSIS — R7989 Other specified abnormal findings of blood chemistry: Secondary | ICD-10-CM | POA: Diagnosis not present

## 2019-05-02 DIAGNOSIS — R519 Headache, unspecified: Secondary | ICD-10-CM | POA: Diagnosis not present

## 2019-05-02 NOTE — Procedures (Signed)
   HISTORY: 85 years old male, presented with seizure  TECHNIQUE:  This is a routine 16 channel EEG recording with one channel devoted to a limited EKG recording.  It was performed during wakefulness, drowsiness and asleep.  Hyperventilation and photic stimulation were performed as activating procedures.  There are minimum muscle and movement artifact noted.  Upon maximum arousal, posterior dominant waking rhythm consistent of mildly dysrhythmic low amplitude activity 9 Hz, activities are symmetric over the bilateral posterior derivations and attenuated with eye opening.  Hyperventilation produced mild/moderate buildup with higher amplitude and the slower activities noted.  Photic stimulation did not alter the tracing.  During EEG recording, patient developed drowsiness and entered no deeper stage of sleep was achieved During EEG recording, there was no epileptiform discharge noted.  EKG demonstrate sinus rhythm, with heart rate of 84 bpm  CONCLUSION: This is essentially a normal awake EEG.  There is no electrodiagnostic evidence of epileptiform discharge.  Marcial Pacas, M.D. Ph.D.  [facility] [place], [place] [address] Phone: [phone] Fax:      [phone]

## 2019-05-07 DIAGNOSIS — H25812 Combined forms of age-related cataract, left eye: Secondary | ICD-10-CM | POA: Diagnosis not present

## 2019-05-07 DIAGNOSIS — H532 Diplopia: Secondary | ICD-10-CM | POA: Diagnosis not present

## 2019-06-22 ENCOUNTER — Encounter (HOSPITAL_COMMUNITY): Payer: Self-pay | Admitting: Emergency Medicine

## 2019-06-22 ENCOUNTER — Emergency Department (HOSPITAL_COMMUNITY): Payer: Medicare Other

## 2019-06-22 ENCOUNTER — Other Ambulatory Visit: Payer: Self-pay

## 2019-06-22 ENCOUNTER — Inpatient Hospital Stay (HOSPITAL_COMMUNITY)
Admission: EM | Admit: 2019-06-22 | Discharge: 2019-06-24 | DRG: 101 | Disposition: A | Discharge status: Home Health | Payer: Medicare Other | Attending: Internal Medicine | Admitting: Internal Medicine

## 2019-06-22 DIAGNOSIS — R519 Headache, unspecified: Secondary | ICD-10-CM | POA: Diagnosis not present

## 2019-06-22 DIAGNOSIS — G4489 Other headache syndrome: Secondary | ICD-10-CM | POA: Diagnosis not present

## 2019-06-22 DIAGNOSIS — Z20822 Contact with and (suspected) exposure to covid-19: Secondary | ICD-10-CM | POA: Diagnosis not present

## 2019-06-22 DIAGNOSIS — E7800 Pure hypercholesterolemia, unspecified: Secondary | ICD-10-CM | POA: Diagnosis not present

## 2019-06-22 DIAGNOSIS — Z87891 Personal history of nicotine dependence: Secondary | ICD-10-CM

## 2019-06-22 DIAGNOSIS — I10 Essential (primary) hypertension: Secondary | ICD-10-CM | POA: Diagnosis not present

## 2019-06-22 DIAGNOSIS — R4020 Unspecified coma: Secondary | ICD-10-CM | POA: Diagnosis not present

## 2019-06-22 DIAGNOSIS — Z86711 Personal history of pulmonary embolism: Secondary | ICD-10-CM | POA: Diagnosis not present

## 2019-06-22 DIAGNOSIS — R569 Unspecified convulsions: Secondary | ICD-10-CM | POA: Diagnosis not present

## 2019-06-22 DIAGNOSIS — R404 Transient alteration of awareness: Secondary | ICD-10-CM | POA: Diagnosis not present

## 2019-06-22 DIAGNOSIS — Z8673 Personal history of transient ischemic attack (TIA), and cerebral infarction without residual deficits: Secondary | ICD-10-CM

## 2019-06-22 DIAGNOSIS — Z8546 Personal history of malignant neoplasm of prostate: Secondary | ICD-10-CM

## 2019-06-22 DIAGNOSIS — Z79899 Other long term (current) drug therapy: Secondary | ICD-10-CM

## 2019-06-22 DIAGNOSIS — G40909 Epilepsy, unspecified, not intractable, without status epilepticus: Secondary | ICD-10-CM | POA: Diagnosis not present

## 2019-06-22 DIAGNOSIS — R000 Tachycardia, unspecified: Secondary | ICD-10-CM | POA: Diagnosis not present

## 2019-06-22 LAB — URINALYSIS, ROUTINE W REFLEX MICROSCOPIC
Bacteria, UA: NONE SEEN
Bilirubin Urine: NEGATIVE
Glucose, UA: NEGATIVE mg/dL
Ketones, ur: NEGATIVE mg/dL
Leukocytes,Ua: NEGATIVE
Nitrite: NEGATIVE
Protein, ur: 100 mg/dL — AB
Specific Gravity, Urine: 1.015 (ref 1.005–1.030)
pH: 5 (ref 5.0–8.0)

## 2019-06-22 LAB — COMPREHENSIVE METABOLIC PANEL
ALT: 12 U/L (ref 0–44)
AST: 21 U/L (ref 15–41)
Albumin: 3.8 g/dL (ref 3.5–5.0)
Alkaline Phosphatase: 68 U/L (ref 38–126)
Anion gap: 9 (ref 5–15)
BUN: 9 mg/dL (ref 8–23)
CO2: 24 mmol/L (ref 22–32)
Calcium: 9 mg/dL (ref 8.9–10.3)
Chloride: 103 mmol/L (ref 98–111)
Creatinine, Ser: 0.98 mg/dL (ref 0.61–1.24)
GFR calc Af Amer: >60 mL/min (ref >60)
GFR calc non Af Amer: >60 mL/min (ref >60)
Glucose, Bld: 158 mg/dL — ABNORMAL HIGH (ref 70–99)
Potassium: 3.6 mmol/L (ref 3.5–5.1)
Sodium: 136 mmol/L (ref 135–145)
Total Bilirubin: 0.7 mg/dL (ref 0.3–1.2)
Total Protein: 7 g/dL (ref 6.5–8.1)

## 2019-06-22 LAB — CBC WITH DIFFERENTIAL/PLATELET
Abs Immature Granulocytes: 0.03 10*3/uL (ref 0.00–0.07)
Basophils Absolute: 0 10*3/uL (ref 0.0–0.1)
Basophils Relative: 0 %
Eosinophils Absolute: 0 10*3/uL (ref 0.0–0.5)
Eosinophils Relative: 0 %
HCT: 41 % (ref 39.0–52.0)
Hemoglobin: 13.8 g/dL (ref 13.0–17.0)
Immature Granulocytes: 0 %
Lymphocytes Relative: 6 %
Lymphs Abs: 0.5 10*3/uL — ABNORMAL LOW (ref 0.7–4.0)
MCH: 32.7 pg (ref 26.0–34.0)
MCHC: 33.7 g/dL (ref 30.0–36.0)
MCV: 97.2 fL (ref 80.0–100.0)
Monocytes Absolute: 0.5 10*3/uL (ref 0.1–1.0)
Monocytes Relative: 6 %
Neutro Abs: 7.3 10*3/uL (ref 1.7–7.7)
Neutrophils Relative %: 88 %
Platelets: 200 10*3/uL (ref 150–400)
RBC: 4.22 MIL/uL (ref 4.22–5.81)
RDW: 12.9 % (ref 11.5–15.5)
WBC: 8.3 10*3/uL (ref 4.0–10.5)
nRBC: 0 % (ref 0.0–0.2)

## 2019-06-22 LAB — PHENYTOIN LEVEL, TOTAL: Phenytoin Lvl: <2.5 ug/mL — ABNORMAL LOW (ref 10.0–20.0)

## 2019-06-22 LAB — RAPID URINE DRUG SCREEN, HOSP PERFORMED
Amphetamines: NOT DETECTED
Barbiturates: NOT DETECTED
Benzodiazepines: NOT DETECTED
Cocaine: NOT DETECTED
Opiates: NOT DETECTED
Tetrahydrocannabinol: NOT DETECTED

## 2019-06-22 LAB — CBG MONITORING, ED: Glucose-Capillary: 190 mg/dL — ABNORMAL HIGH (ref 70–99)

## 2019-06-22 LAB — ETHANOL: Alcohol, Ethyl (B): <10 mg/dL (ref <10)

## 2019-06-22 MED ORDER — LACOSAMIDE 50 MG PO TABS: 50.0000 mg | ORAL_TABLET | Freq: Two times a day (BID) | ORAL | 0 refills | Status: DC

## 2019-06-22 MED ORDER — LORAZEPAM 2 MG/ML IJ SOLN
1.0000 mg | Freq: Once | INTRAMUSCULAR | Status: AC
  Administered 2019-06-22: 1 mg via INTRAVENOUS
  Filled 2019-06-22: qty 1

## 2019-06-22 MED ORDER — LEVETIRACETAM IN NACL 1000 MG/100ML IV SOLN
1000.0000 mg | Freq: Once | INTRAVENOUS | Status: AC
  Administered 2019-06-22: 1000 mg via INTRAVENOUS
  Filled 2019-06-22: qty 100

## 2019-06-22 NOTE — ED Notes (Signed)
Kourtland Shieh brother [id number] looking for an update on the pt

## 2019-06-22 NOTE — ED Notes (Signed)
Patient remains disoriented; looking around the room and unable to recall where he is, why he is here, etc. Super sensitive to noises and rapid movements, pupils dilated to 37mm. Provider notified.

## 2019-06-22 NOTE — ED Notes (Signed)
Seizure pads in place, yellow socks, yellow bracelet placed on patient, fall risk signage in place.

## 2019-06-22 NOTE — ED Provider Notes (Signed)
[facility] EMERGENCY DEPARTMENT Provider Note   CSN: [id number] Arrival date & time: 06/22/19  1802     History Chief Complaint  Patient presents with   Seizures    [name] is a 85 y.o. male brought in for evaluation for possible seizure activity.  Patient does not really recall what happened.  He states that the only thing that he remembers is he has a headache.  I discussed with the brother who states that this afternoon, he was acting strange and patient went into lay in his room.  He stated that his head started to hurt and then brother reported full tonic-clonic seizure activity that lasted for about a minute.  He reports seizure stopped after a minute but patient was still confused and altered and was not waking up.  This prompted him to call EMS.  He states he was in his normal state of health.  His last seizure was about 2 months ago.  He saw his neurologist in January who discontinued his Dilantin and increase his Keppra.  Brother thinks he has been taking his medications as directed.  He denies any recent fevers,  infectious symptoms.  Patient reports a headache to the frontal area of his head.  He denies any vision changes, chest pain, difficulty breathing, abdominal pain, nausea/vomiting, numbness/weakness of his arms or legs.   The history is provided by the patient.       Past Medical History:  Diagnosis Date   Anemia    Cancer ([place])    Prostate   Cervicalgia    Essential hypertension, benign    History of loop recorder 07/14/2014   Impotence of organic origin    Insomnia    Pulmonary embolism (HCC)    Pure hypercholesterolemia    Saddle pulmonary embolus (HCC)    Seizures ([place])    Type II or unspecified type diabetes mellitus without mention of complication, not stated as uncontrolled     Patient Active Problem List   Diagnosis Date Noted   Acute encephalopathy 05/17/2015   Chronic anticoagulation 03/26/2014   Dyslipidemia  03/24/2014   Hx pulmonary embolism 10/25/2013   HTN (hypertension) 10/25/2013   Seizures ([place]) 09/10/2012    Past Surgical History:  Procedure Laterality Date   LOOP RECORDER IMPLANT N/A 03/26/2014   Procedure: LOOP RECORDER IMPLANT;  Surgeon: Sanda Klein, MD;  Location: [facility] CATH [facility];  Service: Cardiovascular;  Laterality: N/A;       Family History  Problem Relation Age of Onset   Cancer Mother    Cancer Father     Social History   Tobacco Use   Smoking status: Former Smoker   Smokeless tobacco: Never Used  Substance Use Topics   Alcohol use: No   Drug use: No    Home Medications Prior to Admission medications   Medication Sig Start Date End Date Taking? Authorizing Provider  levETIRAcetam (KEPPRA) 750 MG tablet Take 2 tablets (1,500 mg total) by mouth 2 (two) times daily. 04/11/19  Yes Garvin Fila, MD  lisinopril (PRINIVIL,ZESTRIL) 30 MG tablet Take 30 mg by mouth daily.   Yes [provider]  pravastatin (PRAVACHOL) 80 MG tablet Take 80 mg by mouth at bedtime.  09/26/18  Yes [provider]  edoxaban (SAVAYSA) 60 MG TABS tablet Take 60 mg by mouth daily. Patient not taking: Reported on 06/23/2019 03/26/14   Florinda Marker, MD  ferrous sulfate 325 (65 FE) MG EC tablet Take 325 mg by mouth 2 (  two) times daily.  08/14/17   [provider]  lacosamide (VIMPAT) 50 MG TABS tablet Take 1 tablet (50 mg total) by mouth 2 (two) times daily. 06/22/19 07/22/19  Volanda Napoleon, PA-C  phenytoin (DILANTIN) 100 MG ER capsule Dilantin total 160 milligrams every morning. 200 mg every afternoon Patient not taking: Reported on 06/23/2019 03/08/19   Jean Rosenthal, MD  phenytoin (DILANTIN) 30 MG ER capsule Take 3 capsules (90 mg total) by mouth daily. Dilantin dosage 160 every morning. 200 every afternoon Patient not taking: Reported on 06/23/2019 03/08/19   Jean Rosenthal, MD    Allergies    Patient has no known allergies.  Review of Systems   Review  of Systems  Constitutional: Negative for fever.  Respiratory: Negative for cough and shortness of breath.   Cardiovascular: Negative for chest pain.  Gastrointestinal: Negative for abdominal pain, nausea and vomiting.  Genitourinary: Negative for dysuria and hematuria.  Neurological: Positive for seizures and headaches. Negative for weakness and numbness.  All other systems reviewed and are negative.   Physical Exam Updated Vital Signs BP (!) 155/86 (BP Location: Right Arm)    Pulse 81    Temp 98 F (36.7 C) (Oral)    Resp 18    SpO2 98%   Physical Exam Vitals and nursing note reviewed.  Constitutional:      Appearance: Normal appearance. He is well-developed.  HENT:     Head: Normocephalic and atraumatic.  Eyes:     General: Lids are normal.     Conjunctiva/sclera: Conjunctivae normal.     Pupils: Pupils are equal, round, and reactive to light.  Cardiovascular:     Rate and Rhythm: Normal rate and regular rhythm.     Pulses: Normal pulses.     Heart sounds: Normal heart sounds. No murmur. No friction rub. No gallop.   Pulmonary:     Effort: Pulmonary effort is normal.     Breath sounds: Normal breath sounds.     Comments: Lungs clear to auscultation bilaterally.  Symmetric chest rise.  No wheezing, rales, rhonchi. Abdominal:     Palpations: Abdomen is soft. Abdomen is not rigid.     Tenderness: There is no abdominal tenderness. There is no guarding.     Comments: Abdomen is soft, non-distended, non-tender. No rigidity, No guarding. No peritoneal signs.  Musculoskeletal:        General: Normal range of motion.     Cervical back: Full passive range of motion without pain.  Skin:    General: Skin is warm and dry.     Capillary Refill: Capillary refill takes less than 2 seconds.  Neurological:     Mental Status: He is alert and oriented to person, place, and time.     Comments: Cranial nerves III-XII intact Follows commands, Moves all extremities  5/5 strength to BUE and  BLE  Sensation intact throughout all major nerve distributions No slurred speech. No facial droop.  Alert and oriented x3.  Psychiatric:        Speech: Speech normal.     ED Results / Procedures / Treatments   Labs (all labs ordered are listed, but only abnormal results are displayed) Labs Reviewed  CBC WITH DIFFERENTIAL/PLATELET - Abnormal; Notable for the following components:      Result Value   Lymphs Abs 0.5 (*)    All other components within normal limits  COMPREHENSIVE METABOLIC PANEL - Abnormal; Notable for the following components:   Glucose, Bld 158 (*)  All other components within normal limits  URINALYSIS, ROUTINE W REFLEX MICROSCOPIC - Abnormal; Notable for the following components:   Hgb urine dipstick MODERATE (*)    Protein, ur 100 (*)    All other components within normal limits  PHENYTOIN LEVEL, TOTAL - Abnormal; Notable for the following components:   Phenytoin Lvl <2.5 (*)    All other components within normal limits  COMPREHENSIVE METABOLIC PANEL - Abnormal; Notable for the following components:   Glucose, Bld 103 (*)    BUN 6 (*)    All other components within normal limits  CBG MONITORING, ED - Abnormal; Notable for the following components:   Glucose-Capillary 190 (*)    All other components within normal limits  SARS CORONAVIRUS 2 (TAT 6-24 HRS)  RAPID URINE DRUG SCREEN, HOSP PERFORMED  ETHANOL  MAGNESIUM  CBC  LEVETIRACETAM LEVEL    EKG None  Radiology CT Head Wo Contrast  Result Date: 06/22/2019 CLINICAL DATA:  Seizures, headache EXAM: CT HEAD WITHOUT CONTRAST TECHNIQUE: Contiguous axial images were obtained from the base of the skull through the vertex without intravenous contrast. COMPARISON:  03/07/2019 FINDINGS: Brain: No acute infarct or hemorrhage. Lateral ventricles and midline structures are stable. No acute extra-axial fluid collections. No mass effect. Vascular: No hyperdense vessel or unexpected calcification. Skull: Normal.  Negative for fracture or focal lesion. Sinuses/Orbits: No acute finding. Other: None IMPRESSION: 1. Stable head CT, no acute process. Electronically Signed   By: Randa Ngo M.D.   On: 06/22/2019 20:42   MR BRAIN WO CONTRAST  Result Date: 06/23/2019 CLINICAL DATA:  Initial evaluation for acute seizure. EXAM: MRI HEAD WITHOUT CONTRAST TECHNIQUE: Multiplanar, multiecho pulse sequences of the brain and surrounding structures were obtained without intravenous contrast. COMPARISON:  Prior head CT from 06/22/2019 FINDINGS: Brain: Examination moderately degraded by motion artifact. Generalized age-related cerebral atrophy. Patchy and confluent T2/FLAIR hyperintensity within the periventricular deep white matter both cerebral hemispheres most consistent with chronic small vessel ischemic disease, moderate in nature. No abnormal foci of restricted diffusion to suggest acute or subacute ischemia. Gray-white matter differentiation maintained. No encephalomalacia to suggest chronic cortical infarction. No definite foci of susceptibility artifact to suggest acute or chronic intracranial hemorrhage. No mass lesion, midline shift or mass effect. No hydrocephalus or extra-axial fluid collection. No obvious intrinsic temporal lobe abnormality. Pituitary gland suprasellar region normal. Midline structures intact. Vascular: Major intracranial vascular flow voids are maintained. Skull and upper cervical spine: Craniocervical junction within normal limits. Multilevel degenerative spondylosis noted within the upper cervical spine with resultant mild diffuse spinal stenosis. Bone marrow signal intensity normal. No scalp soft tissue abnormality. Sinuses/Orbits: Patient status post ocular lens replacement on the right. Globes and orbital soft tissues demonstrate no acute finding. Paranasal sinuses are largely clear. No mastoid effusion. Other: None. IMPRESSION: 1. No acute intracranial abnormality. 2. Age-related cerebral atrophy  with moderate chronic microvascular ischemic disease. Electronically Signed   By: Jeannine Boga M.D.   On: 06/23/2019 03:20   EEG adult  Result Date: 06/23/2019 Lora Havens, MD     06/23/2019 10:09 AM Patient Name: [name] MRN: [id number] Epilepsy Attending: Lora Havens Referring Physician/Provider: Dr Mitzi Hansen Date: 06/23/2019 Duration: 26.15 mins Patient history: : 85 year old male with h/o epilepsy who presented with likely breakthrough seizure with prolonged postictal state/possibly drug effect. EEG to evaluate for seizure. Level of alertness: awake AEDs during EEG study: Keppra, Vimpat, Ativan Technical aspects: This EEG study was done with scalp electrodes positioned according to the  10-20 International system of electrode placement. Electrical activity was acquired at a sampling rate of 500Hz  and reviewed with a high frequency filter of 70Hz  and a low frequency filter of 1Hz . EEG data were recorded continuously and digitally stored. DESCRIPTION: The posterior dominant rhythm consists of 8 Hz activity of moderate voltage (25-35 uV) seen predominantly in posterior head regions, symmetric and reactive to eye opening and eye closing. There is an excessive amount of 15 to 18 Hz, 2-3 uV beta activity with irregular morphology distributed symmetrically and diffusely.  Physiologic photic driving was seen during photic stimulation.  Hyperventilation was not performed.    IMPRESSION: This study is within normal limits. No seizures or epileptiform discharges were seen throughout the recording. Lora Havens    Procedures Procedures (including critical care time)  Medications Ordered in ED Medications  lacosamide (VIMPAT) tablet 50 mg (50 mg Oral Given 06/23/19 2117)  levETIRAcetam (KEPPRA) tablet 1,500 mg (1,500 mg Oral Given 06/23/19 2117)  lacosamide (VIMPAT) tablet 50 mg (50 mg Oral Not Given 06/23/19 0147)  sodium chloride flush (NS) 0.9 % injection 3 mL (3 mLs Intravenous Given  06/23/19 2118)  sodium chloride flush (NS) 0.9 % injection 3 mL (3 mLs Intravenous Given 06/23/19 2118)  sodium chloride flush (NS) 0.9 % injection 3 mL (has no administration in time range)  0.9 %  sodium chloride infusion (has no administration in time range)  acetaminophen (TYLENOL) tablet 650 mg (has no administration in time range)    Or  acetaminophen (TYLENOL) suppository 650 mg (has no administration in time range)  ondansetron (ZOFRAN) tablet 4 mg (has no administration in time range)    Or  ondansetron (ZOFRAN) injection 4 mg (has no administration in time range)  labetalol (NORMODYNE) injection 10 mg (10 mg Intravenous Given 06/23/19 0318)  lisinopril (ZESTRIL) tablet 20 mg (20 mg Oral Given 06/23/19 1533)  edoxaban (SAVAYSA) tablet 60 mg (60 mg Oral Given 06/23/19 1533)  levETIRAcetam (KEPPRA) IVPB 1000 mg/100 mL premix (0 mg Intravenous Stopped 06/22/19 2306)  LORazepam (ATIVAN) injection 1 mg (1 mg Intravenous Given 06/22/19 2201)    ED Course  I have reviewed the triage vital signs and the nursing notes.  Pertinent labs & imaging results that were available during my care of the patient were reviewed by me and considered in my medical decision making (see chart for details).    MDM Rules/Calculators/A&P                      85 year old male with past medical severe seizures (on Keppra) who presents for evaluation of seizure that occurred today.  Seizure was witnessed by her brother and lasted for about a minute.  Brother does think he has been taking his medications.  Followed up with his neurologist in January 2021.  Patient reports a frontal headache.  Denies any other complaints.  On initial ED arrival, he is afebrile, nontoxic-appearing.  He is slightly tachycardic.  Vitals otherwise stable.  No neuro deficits noted on exam.  Plan to check labs, head CT.  I discussed with patient's brother [name] who provided most of the history.  He stated that patient had had a seizure in  December and had increase in his Keppra.  He followed up with his neurologist who discontinued his Dilantin and increase his Keppra.  He had been doing well until today seizure.  UDS negative for any drugs.  UA shows moderate hemoglobin.  Phenytoin is less than 2.5.  CMP  shows normal BUN and creatinine.  CBC shows no leukocytosis or anemia.  CBG is 190.  CT head negative for any acute abnormalities.  Discussed patient with Dr. Leonel Ramsay (neuro).  Patient does not have room to go up on his [place].  He recommends following up with patient's neurologist tomorrow and calling to arrange for an appointment.  He does state that we can start him on Vimpat 50 mg twice daily and states that he should follow-up as directed.  Nurse was concerned about possible disorientation.  I went and evaluated patient.  He was sleeping.  He was initially startled when I woke him up but he was able to answer all questions was alert and oriented x3.  He was able to follow commands and had no signs of neuro deficits.  Continue to let him rest.  I discussed with brother.  At this time, patient appears slightly anxious but otherwise is resting comfortably on bed.  He is alert and oriented x3 and he is able to answer all my questions.  At this time, given his return back to baseline as well as neurology recommendation, I feel that he can be reasonably discharged. Discussed patient with Dr. Wilson Singer who independently evaluated patient and found him to be awake and alert and oriented x3.  Brother will come pick him up.  I discussed this with patient.  He reports his headache is gone.  He is sleeping comfortably.  I discussed with him regarding starting medications and following up to his neurology and he was able to repeat this back to me.  He was again able to tell me where he was, his name and what year it was.  He is able to tell me he lives with his brother. At this time, patient exhibits no emergent life-threatening condition that  require further evaluation in ED or admission. Patient had ample opportunity for questions and discussion. All patient's questions were answered with full understanding. Strict return precautions discussed. Patient expresses understanding and agreement to plan.   RN informing that as she was discharging patient and took amount to his family, there was concern that he was not at his baseline.  Nurse stated that his head would Mikki Santee and we have when placed him in the wheelchair and was needing assistance to stand from the wheelchair.  Patient was brought back to the ED.  I evaluated patient he was able to sit up.  I did not witness any head bobbing or abnormal head movements on my exam.  Given family is concerned that he is not at his baseline, will plan to discuss with neuro.  Discussed with Dr. Leonel Ramsay (neuro) who will come evaluate patient in the ED.  Patient signed out to Quincy Carnes, PA-C pending neurology recommendation.  Portions of this note were generated with [employer]. Dictation errors may occur despite best attempts at proofreading.   Final Clinical Impression(s) / ED Diagnoses Final diagnoses:  Seizure [facility])    Rx / DC Orders ED Discharge Orders         Ordered    lacosamide (VIMPAT) 50 MG TABS tablet  2 times daily     06/22/19 2331           Desma Mcgregor 06/23/19 2252    Virgel Manifold, MD 06/25/19 [phone]

## 2019-06-22 NOTE — ED Triage Notes (Signed)
Pt here from home with c/o seizures hx of same , no meds given by ems , cbg 172 , room mate thinks pt has been taking his meds

## 2019-06-22 NOTE — Discharge Instructions (Signed)
Your work-up today was reassuring.  You need to take your Keppra as directed.  Additionally, we have also provided a prescription for Vimpat which you can start taking.  Discussed with your neurologist whether or not she wants to continue taking it.  You need to call your neurologist tomorrow and arrange for an outpatient appointment.  Return to the emergency department for any return seizure activity, confusion, difficulty walking, numbness/weakness or any other worsening concerning symptoms.

## 2019-06-23 ENCOUNTER — Observation Stay (HOSPITAL_COMMUNITY): Payer: Medicare Other

## 2019-06-23 ENCOUNTER — Encounter (HOSPITAL_COMMUNITY): Payer: Self-pay | Admitting: Family Medicine

## 2019-06-23 DIAGNOSIS — I10 Essential (primary) hypertension: Secondary | ICD-10-CM | POA: Diagnosis not present

## 2019-06-23 DIAGNOSIS — Z86711 Personal history of pulmonary embolism: Secondary | ICD-10-CM | POA: Diagnosis not present

## 2019-06-23 DIAGNOSIS — G40909 Epilepsy, unspecified, not intractable, without status epilepticus: Secondary | ICD-10-CM | POA: Diagnosis present

## 2019-06-23 DIAGNOSIS — Z87891 Personal history of nicotine dependence: Secondary | ICD-10-CM | POA: Diagnosis not present

## 2019-06-23 DIAGNOSIS — E7800 Pure hypercholesterolemia, unspecified: Secondary | ICD-10-CM | POA: Diagnosis present

## 2019-06-23 DIAGNOSIS — Z20822 Contact with and (suspected) exposure to covid-19: Secondary | ICD-10-CM | POA: Diagnosis present

## 2019-06-23 DIAGNOSIS — Z8673 Personal history of transient ischemic attack (TIA), and cerebral infarction without residual deficits: Secondary | ICD-10-CM | POA: Diagnosis not present

## 2019-06-23 DIAGNOSIS — R569 Unspecified convulsions: Secondary | ICD-10-CM

## 2019-06-23 DIAGNOSIS — Z8546 Personal history of malignant neoplasm of prostate: Secondary | ICD-10-CM | POA: Diagnosis not present

## 2019-06-23 DIAGNOSIS — Z79899 Other long term (current) drug therapy: Secondary | ICD-10-CM | POA: Diagnosis not present

## 2019-06-23 LAB — SARS CORONAVIRUS 2 (TAT 6-24 HRS): SARS Coronavirus 2: NEGATIVE

## 2019-06-23 LAB — COMPREHENSIVE METABOLIC PANEL
ALT: 12 U/L (ref 0–44)
AST: 22 U/L (ref 15–41)
Albumin: 3.8 g/dL (ref 3.5–5.0)
Alkaline Phosphatase: 70 U/L (ref 38–126)
Anion gap: 11 (ref 5–15)
BUN: 6 mg/dL — ABNORMAL LOW (ref 8–23)
CO2: 24 mmol/L (ref 22–32)
Calcium: 9.1 mg/dL (ref 8.9–10.3)
Chloride: 103 mmol/L (ref 98–111)
Creatinine, Ser: 0.89 mg/dL (ref 0.61–1.24)
GFR calc Af Amer: >60 mL/min (ref >60)
GFR calc non Af Amer: >60 mL/min (ref >60)
Glucose, Bld: 103 mg/dL — ABNORMAL HIGH (ref 70–99)
Potassium: 3.5 mmol/L (ref 3.5–5.1)
Sodium: 138 mmol/L (ref 135–145)
Total Bilirubin: 0.9 mg/dL (ref 0.3–1.2)
Total Protein: 7 g/dL (ref 6.5–8.1)

## 2019-06-23 LAB — CBC
HCT: 42 % (ref 39.0–52.0)
Hemoglobin: 14.1 g/dL (ref 13.0–17.0)
MCH: 32.9 pg (ref 26.0–34.0)
MCHC: 33.6 g/dL (ref 30.0–36.0)
MCV: 97.9 fL (ref 80.0–100.0)
Platelets: 207 10*3/uL (ref 150–400)
RBC: 4.29 MIL/uL (ref 4.22–5.81)
RDW: 13 % (ref 11.5–15.5)
WBC: 6.3 10*3/uL (ref 4.0–10.5)
nRBC: 0 % (ref 0.0–0.2)

## 2019-06-23 LAB — MAGNESIUM: Magnesium: 1.8 mg/dL (ref 1.7–2.4)

## 2019-06-23 MED ORDER — ONDANSETRON HCL 4 MG/2ML IJ SOLN: 4.0000 mg | Freq: Four times a day (QID) | INTRAMUSCULAR | Status: DC | PRN

## 2019-06-23 MED ORDER — LACOSAMIDE 50 MG PO TABS
50.0000 mg | ORAL_TABLET | Freq: Two times a day (BID) | ORAL | Status: DC
  Administered 2019-06-23 – 2019-06-24 (×4): 50 mg via ORAL
  Filled 2019-06-23 (×3): qty 1

## 2019-06-23 MED ORDER — LACOSAMIDE 50 MG PO TABS
50.0000 mg | ORAL_TABLET | Freq: Once | ORAL | Status: DC
  Filled 2019-06-23: qty 1

## 2019-06-23 MED ORDER — EDOXABAN TOSYLATE 30 MG PO TABS
60.0000 mg | ORAL_TABLET | ORAL | Status: DC
  Administered 2019-06-23: 60 mg via ORAL
  Filled 2019-06-23: qty 60
  Filled 2019-06-23 (×2): qty 2

## 2019-06-23 MED ORDER — ONDANSETRON HCL 4 MG PO TABS: 4.0000 mg | ORAL_TABLET | Freq: Four times a day (QID) | ORAL | Status: DC | PRN

## 2019-06-23 MED ORDER — SODIUM CHLORIDE 0.9% FLUSH: 3.0000 mL | INTRAVENOUS | Status: DC | PRN

## 2019-06-23 MED ORDER — LABETALOL HCL 5 MG/ML IV SOLN
10.0000 mg | INTRAVENOUS | Status: DC | PRN
  Administered 2019-06-23: 10 mg via INTRAVENOUS
  Filled 2019-06-23: qty 4

## 2019-06-23 MED ORDER — LEVETIRACETAM 750 MG PO TABS
1500.0000 mg | ORAL_TABLET | Freq: Two times a day (BID) | ORAL | Status: DC
  Administered 2019-06-23 – 2019-06-24 (×3): 1500 mg via ORAL
  Filled 2019-06-23 (×3): qty 2

## 2019-06-23 MED ORDER — ACETAMINOPHEN 650 MG RE SUPP: 650.0000 mg | Freq: Four times a day (QID) | RECTAL | Status: DC | PRN

## 2019-06-23 MED ORDER — SODIUM CHLORIDE 0.9% FLUSH
3.0000 mL | Freq: Two times a day (BID) | INTRAVENOUS | Status: DC
  Administered 2019-06-23 (×3): 3 mL via INTRAVENOUS

## 2019-06-23 MED ORDER — SODIUM CHLORIDE 0.9% FLUSH
3.0000 mL | Freq: Two times a day (BID) | INTRAVENOUS | Status: DC
  Administered 2019-06-23 – 2019-06-24 (×3): 3 mL via INTRAVENOUS

## 2019-06-23 MED ORDER — LISINOPRIL 20 MG PO TABS
20.0000 mg | ORAL_TABLET | Freq: Every day | ORAL | Status: DC
  Administered 2019-06-23 – 2019-06-24 (×2): 20 mg via ORAL
  Filled 2019-06-23 (×2): qty 1

## 2019-06-23 MED ORDER — ACETAMINOPHEN 325 MG PO TABS: 650.0000 mg | ORAL_TABLET | Freq: Four times a day (QID) | ORAL | Status: DC | PRN

## 2019-06-23 MED ORDER — SODIUM CHLORIDE 0.9 % IV SOLN: 250.0000 mL | INTRAVENOUS | Status: DC | PRN

## 2019-06-23 NOTE — ED Notes (Signed)
Patient transported to CT 

## 2019-06-23 NOTE — ED Notes (Signed)
Attempted to d/c patient and go over paperwork with family (pt's brother, whom he lives with, and his nephew). Both family members stated that pt was not at baseline, that he "wasn't right" and that we needed to keep him for further evaluation. Camera operator and Md notified. Patient does continue to weave/bob head as well has have difficulty standing without assistance. Pt's family is vehement that this is not normal behavior for patient.

## 2019-06-23 NOTE — Progress Notes (Signed)
CM has submitted for benefits check for Vimpat 50 mg BID.

## 2019-06-23 NOTE — Progress Notes (Signed)
MRI brain shows no acute intracranial abnormality. Age-related cerebral atrophy with moderate chronic microvascular ischemic disease is noted.   EEG is pending.   Electronically signed: Dr. Kerney Elbe

## 2019-06-23 NOTE — Procedures (Signed)
Patient Name: [name]  MRN: [id number]  Epilepsy Attending: Lora Havens  Referring Physician/Provider: Dr Mitzi Hansen Date: 06/23/2019  Duration: 26.15 mins  Patient history: : 85 year old male with h/o epilepsy who presented with likely breakthrough seizure with prolonged postictal state/possibly drug effect. EEG to evaluate for seizure.   Level of alertness: awake  AEDs during EEG study: Keppra, Vimpat, Ativan  Technical aspects: This EEG study was done with scalp electrodes positioned according to the 10-20 International system of electrode placement. Electrical activity was acquired at a sampling rate of 500Hz  and reviewed with a high frequency filter of 70Hz  and a low frequency filter of 1Hz . EEG data were recorded continuously and digitally stored.   DESCRIPTION: The posterior dominant rhythm consists of 8 Hz activity of moderate voltage (25-35 uV) seen predominantly in posterior head regions, symmetric and reactive to eye opening and eye closing. There is an excessive amount of 15 to 18 Hz, 2-3 uV beta activity with irregular morphology distributed symmetrically and diffusely.  Physiologic photic driving was seen during photic stimulation.  Hyperventilation was not performed.      IMPRESSION: This study is within normal limits. No seizures or epileptiform discharges were seen throughout the recording.  [name] [name]

## 2019-06-23 NOTE — H&P (Signed)
History and Physical    [name] [id number] DOB: [birth year] DOA: 06/22/2019  PCP: Cyndi Bender, PA-C   Patient coming from: Home   Chief Complaint: Seizure, headache   HPI: [name] is a 85 y.o. male with medical history significant for hypertension, prostate cancer, and seizure disorder, now presenting to the emergency department for evaluation of headache and reported generalized seizure at home.  Patient seemed to be behaving unusually per his brother, complained of a headache, and then had witnessed generalized seizure like activity.  Patient reports a frontal headache, denies any other pain, fevers, or chills. Dilantin was discontinued 3 months ago and Keppra was increased at that time per neurology clinic notes.  Patient reports adherence with his medications.  ED Course: Upon arrival to the ED, patient is found to be afebrile, saturating well on room air, and with stable blood pressure.  Noncontrast head CT was negative for acute findings, UDS negative, ethanol undetectable, and with unremarkable CMP and CBC.  Patient was given 1 g of IV Keppra and 1 mg IV Ativan in the ED, neurology was consulted by the ED physician, and a medical admission was requested.  Review of Systems:  All other systems reviewed and apart from HPI, are negative.  Past Medical History:  Diagnosis Date  . Anemia   . Cancer [facility])    Prostate  . Cervicalgia   . Essential hypertension, benign   . History of loop recorder 07/14/2014  . Impotence of organic origin   . Insomnia   . Pulmonary embolism ([place])   . Pure hypercholesterolemia   . Saddle pulmonary embolus ([place])   . Seizures ([place])   . Type II or unspecified type diabetes mellitus without mention of complication, not stated as uncontrolled     Past Surgical History:  Procedure Laterality Date  . LOOP RECORDER IMPLANT N/A 03/26/2014   Procedure: LOOP RECORDER IMPLANT;  Surgeon: Sanda Klein, MD;  Location: [facility] CATH LAB;  Service: Cardiovascular;   Laterality: N/A;     reports that he has quit smoking. He has never used smokeless tobacco. He reports that he does not drink alcohol or use drugs.  No Known Allergies  Family History  Problem Relation Age of Onset  . Cancer Mother   . Cancer Father      Prior to Admission medications   Medication Sig Start Date End Date Taking? Authorizing Provider  edoxaban (SAVAYSA) 60 MG TABS tablet Take 60 mg by mouth daily. Patient taking differently: Take 60 mg by mouth daily.  03/26/14   Burns, Arloa Koh, MD  ferrous sulfate 325 (65 FE) MG EC tablet Take 325 mg by mouth 2 (two) times daily.  08/14/17   [provider]  lacosamide (VIMPAT) 50 MG TABS tablet Take 1 tablet (50 mg total) by mouth 2 (two) times daily. 06/22/19 07/22/19  Volanda Napoleon, PA-C  levETIRAcetam (KEPPRA) 750 MG tablet Take 2 tablets (1,500 mg total) by mouth 2 (two) times daily. 04/11/19   Garvin Fila, MD  lisinopril (PRINIVIL,ZESTRIL) 30 MG tablet Take 30 mg by mouth daily.    [provider]  phenytoin (DILANTIN) 100 MG ER capsule Dilantin total 160 milligrams every morning. 200 mg every afternoon 03/08/19   Jean Rosenthal, MD  phenytoin (DILANTIN) 30 MG ER capsule Take 3 capsules (90 mg total) by mouth daily. Dilantin dosage 160 every morning. 200 every afternoon 03/08/19   Jean Rosenthal, MD  pravastatin (PRAVACHOL) 80 MG tablet Take 80 mg by  mouth at bedtime.  09/26/18   [provider]    Physical Exam: Vitals:   06/22/19 1808 06/22/19 2205 06/23/19 0052 06/23/19 0136  BP: (!) 179/87 (!) 148/85 (!) 177/83 (!) 174/92  Pulse: (!) 117 86 82 70  Resp: 18 15 16 16   Temp: 98.7 F (37.1 C)   98.7 F (37.1 C)  TempSrc: Oral   Oral  SpO2: 97% 97% 96% 96%    Constitutional: NAD, calm  Eyes: PERTLA, lids and conjunctivae normal ENMT: Mucous membranes are moist. Posterior pharynx clear of any exudate or lesions.   Neck: normal, supple, no masses, no thyromegaly Respiratory:  no wheezing, no  crackles. No accessory muscle use.  Cardiovascular: S1 & S2 heard, regular rate and rhythm. No extremity edema.   Abdomen: No distension, no tenderness, soft. Bowel sounds active.  Musculoskeletal: no clubbing / cyanosis. No joint deformity upper and lower extremities.   Skin: no significant rashes, lesions, ulcers. Warm, dry, well-perfused. Neurologic: CN 2-12 grossly intact. Sensation intact. Strength 5/5 in all 4  limbs. Easily distracted.  Psychiatric: Alert and oriented to person, place, and situation. Slow in answering questions. Pleasant and cooperative.    Labs and Imaging on Admission: I have personally reviewed following labs and imaging studies  CBC: Recent Labs  Lab 06/22/19 2100  WBC 8.3  NEUTROABS 7.3  HGB 13.8  HCT 41.0  MCV 97.2  PLT [id number]   Basic Metabolic Panel: Recent Labs  Lab 06/22/19 2100  NA 136  K 3.6  CL 103  CO2 24  GLUCOSE 158*  BUN 9  CREATININE 0.98  CALCIUM 9.0   GFR: CrCl cannot be calculated (Unknown ideal weight.). Liver Function Tests: Recent Labs  Lab 06/22/19 2100  AST 21  ALT 12  ALKPHOS 68  BILITOT 0.7  PROT 7.0  ALBUMIN 3.8   No results for input(s): LIPASE, AMYLASE in the last 168 hours. No results for input(s): AMMONIA in the last 168 hours. Coagulation Profile: No results for input(s): INR, PROTIME in the last 168 hours. Cardiac Enzymes: No results for input(s): CKTOTAL, CKMB, CKMBINDEX, TROPONINI in the last 168 hours. BNP (last 3 results) No results for input(s): PROBNP in the last 8760 hours. HbA1C: No results for input(s): HGBA1C in the last 72 hours. CBG: Recent Labs  Lab 06/22/19 1811  GLUCAP 190*   Lipid Profile: No results for input(s): CHOL, HDL, LDLCALC, TRIG, CHOLHDL, LDLDIRECT in the last 72 hours. Thyroid Function Tests: No results for input(s): TSH, T4TOTAL, FREET4, T3FREE, THYROIDAB in the last 72 hours. Anemia Panel: No results for input(s): VITAMINB12, FOLATE, FERRITIN, TIBC, IRON, RETICCTPCT  in the last 72 hours. Urine analysis:    Component Value Date/Time   COLORURINE YELLOW 06/22/2019 2120   APPEARANCEUR CLEAR 06/22/2019 2120   LABSPEC 1.015 06/22/2019 2120   PHURINE 5.0 06/22/2019 2120   GLUCOSEU NEGATIVE 06/22/2019 2120   HGBUR MODERATE (A) 06/22/2019 2120   BILIRUBINUR NEGATIVE 06/22/2019 2120   [place] NEGATIVE 06/22/2019 2120   PROTEINUR 100 (A) 06/22/2019 2120   UROBILINOGEN 0.2 03/24/2014 1426   NITRITE NEGATIVE 06/22/2019 2120   LEUKOCYTESUR NEGATIVE 06/22/2019 2120   Sepsis Labs: @LABRCNTIP (procalcitonin:4,lacticidven:4) )No results found for this or any previous visit (from the past 240 hour(s)).   Radiological Exams on Admission: CT Head Wo Contrast  Result Date: 06/22/2019 CLINICAL DATA:  Seizures, headache EXAM: CT HEAD WITHOUT CONTRAST TECHNIQUE: Contiguous axial images were obtained from the base of the skull through the vertex without intravenous contrast. COMPARISON:  03/07/2019 FINDINGS: Brain: No acute infarct or hemorrhage. Lateral ventricles and midline structures are stable. No acute extra-axial fluid collections. No mass effect. Vascular: No hyperdense vessel or unexpected calcification. Skull: Normal. Negative for fracture or focal lesion. Sinuses/Orbits: No acute finding. Other: None IMPRESSION: 1. Stable head CT, no acute process. Electronically Signed   By: Randa Ngo M.D.   On: 06/22/2019 20:42    Assessment/Plan   1. Seizures  - Patient follows with neurology for seizure disorder, stopped Dilantin and had Keppra increased 3 months ago, his brother witnessed generalized seizure today, and patient now presents with headache and confusion  - Head CT negative for acute findings, basic labs unremarkable  - Patient was given 1000 mg IV Keppra and 1 mg IV Ativan in ED  - Neurology consulting and much appreciated, MRI and EEG were ordered and Vimpat started   - Patient still has some confusion and difficulty walking unassisted  - Follow-up  MRI brain and EEG, continue Keppra and Vimpat   2. Hypertension   - Pharmacy medication-reconciliation pending, will treat as needed for now    3. History of PE  - No evidence for acute VTE  - Patient had been on edoxaban, pharmacy medication-reconciliation currently pending     DVT prophylaxis: had been on edoxaban, pharmacy medication-reconciliation currently pending  Code Status: Full  Family Communication: Discussed with patient   Disposition Plan: Likely home within 24-48 hours pending MRI brain, possible EEG, neurology sign-off and return to close baseline  Consults called: Neurology  Admission status: Observation     Vianne Bulls, MD [facility] Pager: See [email or web address]  If 7AM-7PM, please contact the daytime attending [email or web address]  06/23/2019, 1:47 AM

## 2019-06-23 NOTE — ED Provider Notes (Signed)
  12:54 AM Dr. Leonel Ramsay with neurology has evaluated patient.  Given he has not fully returned back to baseline, will admit for observation.  May be medication side effect (he did get ativan here in ED).  Plan to start vimpat, obtain MRI.  If still not back to baseline in AM, will get EEG.  Will admit to medicine.  Discussed with hospitalist, Dr. Myna Hidalgo-- he will admit for ongoing care.   Larene Pickett, PA-C 06/23/19 0129    Ward, Delice Bison, DO 06/23/19 [phone]

## 2019-06-23 NOTE — Consult Note (Signed)
Neurology Consultation Reason for Consult: Seizures Referring Physician: Providence Lanius  CC: Seizure  History is obtained from: Patient, chart  HPI: [name] is a 85 y.o. male with a history of seizures who has previously been on Dilantin and Keppra but was taken off of Dilantin in January.  He is currently on Keppra monotherapy at 1500 twice a day.  His brother apparently reported that he has been compliant with his medications.  Today, he had a breakthrough seizure lasting approximately 1 minute.  Since that time, he has been slightly confused and unsteady.  He was given Ativan and Keppra load in the emergency department.  Due to continued unsteadiness, he is being admitted for observation.  He has been incontinent in the emergency department.    ROS: A 14 point ROS was performed and is negative except as noted in the HPI.   Past Medical History:  Diagnosis Date  . Anemia   . Cancer [facility])    Prostate  . Cervicalgia   . Essential hypertension, benign   . History of loop recorder 07/14/2014  . Impotence of organic origin   . Insomnia   . Pulmonary embolism ([place])   . Pure hypercholesterolemia   . Saddle pulmonary embolus ([place])   . Seizures ([place])   . Type II or unspecified type diabetes mellitus without mention of complication, not stated as uncontrolled      Family History  Problem Relation Age of Onset  . Cancer Mother   . Cancer Father      Social History:  reports that he has quit smoking. He has never used smokeless tobacco. He reports that he does not drink alcohol or use drugs.   Exam: Current vital signs: BP (!) 177/83   Pulse 82   Temp 98.7 F (37.1 C) (Oral)   Resp 16   SpO2 96%  Vital signs in last 24 hours: Temp:  [98.7 F (37.1 C)] 98.7 F (37.1 C) (03/28 1808) Pulse Rate:  [82-117] 82 (03/29 0052) Resp:  [15-18] 16 (03/29 0052) BP: (148-179)/(83-87) 177/83 (03/29 0052) SpO2:  [96 %-97 %] 96 % (03/29 0052)   Physical Exam  Constitutional:  Appears well-developed and well-nourished.  Psych: Affect appropriate to situation Eyes: No scleral injection HENT: No OP obstrucion MSK: no joint deformities.  Cardiovascular: Normal rate and regular rhythm.  Respiratory: Effort normal, non-labored breathing GI: Soft.  No distension. There is no tenderness.  Skin: WDI  Neuro: Mental Status: Patient is awake, he appears to have impaired attention, not focusing on the conversation.  He is slow to respond to questions, but does respond correctly. Cranial Nerves: II: Visual Fields are full. Pupils are equal, round, and reactive to light.   III,IV, VI: EOMI without ptosis or diploplia.  V: Facial sensation is symmetric to temperature VII: Facial movement is symmetric.  VIII: hearing is intact to voice X: Uvula elevates symmetrically XI: Shoulder shrug is symmetric. XII: tongue is midline without atrophy or fasciculations.  Motor: Tone is normal. Bulk is normal. 5/5 strength was present in all four extremities.  Sensory: Sensation is symmetric to light touch and temperature in the arms and legs. Cerebellar: He does not have any past-pointing on finger-nose-finger, but does have truncal ataxia when trying to stand.      I have reviewed labs in epic and the results pertinent to this consultation are: UA-negative CMP-unremarkable  I have reviewed the images obtained: CT head-negative  Impression: 85 year old male with breakthrough seizure prolonged postictal state/possibly drug effect.  Ativan can certainly cause some ataxia, but in any case he does not appear safe to discharge at this time.  He is being admitted for observation.  This is his first seizure since stopping Dilantin 2 months ago.  Recommendations: 1) MRI brain 2) EEG 3) continue home dose of Keppra at 1500 twice daily 4) start Vimpat 50 twice daily   Roland Rack, MD [facility] [phone]  If 7pm- 7am, please page neurology on call as  listed in [place].

## 2019-06-23 NOTE — Progress Notes (Signed)
Progress Note    [name]  [id number] DOB: [birth year]  DOA: 06/22/2019 PCP: Cyndi Bender, PA-C    Brief Narrative:   Chief complaint: seizure  Medical records reviewed and are as summarized below:  [name] is an 85 y.o. male with a past medical history that includes hypertension, prostate cancer, CVA, seizure disorder, pulmonary embolism presents to the emergency department chief complaint of seizure.  Assessment/Plan:   Principal Problem:   Seizures ([place]) Active Problems:   Hx pulmonary embolism   HTN (hypertension)   #1.  Seizure.  Reports compliance with his medication.  CT of the head negative for any acute findings.  MRI of the brain no acute intracranial abnormality.  No sign symptoms of infection.  No metabolic derangement.  He does indicate that his neurologist changed his medications on his last visit which was about 3 months ago.  At that time his Dilantin was discontinued and his Keppra was increased.  He lives with his brother who reports compliance with his medications.  EEG done today without acute abnormalities.  Evaluated by neurology.  He was given 1 g of IV Keppra 1 mg of IV Ativan in the emergency department and vimpat started  No further seizure activity. -Continue Keppra -Continue Vimpat -Seizure precautions -PT evaluation  #2.  Hypertension.  Controlled.  Home medications include lisinopril. -Continue home meds at lower dose  #3.  History of PE.  Home medications include edoxaban -continue home med   Family Communication/Anticipated D/C date and plan/Code Status   DVT prophylaxis: Lovenox ordered. Code Status: Full Code.  Family Communication: pateint Disposition Plan: home likely tomorrow   Medical Consultants:    Leonel Ramsay neuro   Anti-Infectives:    None  Subjective:   Awake alert oriented x3  Objective:    Vitals:   06/23/19 0400 06/23/19 0700 06/23/19 0813 06/23/19 1236  BP: (!) 162/85 (!) 158/87 (!) 155/72 (!)  147/86  Pulse: 69 67 69 85  Resp: (!) 24 17 18 20   Temp:   98.6 F (37 C) 98.4 F (36.9 C)  TempSrc:   Oral Oral  SpO2: 97% 98% 100% 97%   No intake or output data in the 24 hours ending 06/23/19 1330 There were no vitals filed for this visit.  Exam: General: Well-nourished alert no acute distress CV: Regular rate and rhythm no murmur gallop or rub no lower extremity edema Respiratory: No increased work of breathing breath sounds are clear bilaterally I hear no wheeze no rhonchi Abdomen: Soft nondistended positive bowel sounds throughout nontender to palpation no guarding or rebounding Neuro: Alert and oriented x3 speech is slow but clear bilateral grip 5 out of 5  Data Reviewed:   I have personally reviewed following labs and imaging studies:  Labs: Labs show the following:   Basic Metabolic Panel: Recent Labs  Lab 06/22/19 2100 06/23/19 0531  NA 136 138  K 3.6 3.5  CL 103 103  CO2 24 24  GLUCOSE 158* 103*  BUN 9 6*  CREATININE 0.98 0.89  CALCIUM 9.0 9.1  MG  --  1.8   GFR CrCl cannot be calculated (Unknown ideal weight.). Liver Function Tests: Recent Labs  Lab 06/22/19 2100 06/23/19 0531  AST 21 22  ALT 12 12  ALKPHOS 68 70  BILITOT 0.7 0.9  PROT 7.0 7.0  ALBUMIN 3.8 3.8   No results for input(s): LIPASE, AMYLASE in the last 168 hours. No results for input(s): AMMONIA in the last 168 hours. Coagulation  profile No results for input(s): INR, PROTIME in the last 168 hours.  CBC: Recent Labs  Lab 06/22/19 2100 06/23/19 0531  WBC 8.3 6.3  NEUTROABS 7.3  --   HGB 13.8 14.1  HCT 41.0 42.0  MCV 97.2 97.9  PLT 200 207   Cardiac Enzymes: No results for input(s): CKTOTAL, CKMB, CKMBINDEX, TROPONINI in the last 168 hours. BNP (last 3 results) No results for input(s): PROBNP in the last 8760 hours. CBG: Recent Labs  Lab 06/22/19 1811  GLUCAP 190*   D-Dimer: No results for input(s): DDIMER in the last 72 hours. Hgb A1c: No results for input(s):  HGBA1C in the last 72 hours. Lipid Profile: No results for input(s): CHOL, HDL, LDLCALC, TRIG, CHOLHDL, LDLDIRECT in the last 72 hours. Thyroid function studies: No results for input(s): TSH, T4TOTAL, T3FREE, THYROIDAB in the last 72 hours.  Invalid input(s): FREET3 Anemia work up: No results for input(s): VITAMINB12, FOLATE, FERRITIN, TIBC, IRON, RETICCTPCT in the last 72 hours. Sepsis Labs: Recent Labs  Lab 06/22/19 2100 06/23/19 0531  WBC 8.3 6.3    Microbiology Recent Results (from the past 240 hour(s))  SARS CORONAVIRUS 2 (TAT 6-24 HRS) Nasopharyngeal Nasopharyngeal Swab     Status: None   Collection Time: 06/23/19  1:09 AM   Specimen: Nasopharyngeal Swab  Result Value Ref Range Status   SARS Coronavirus 2 NEGATIVE NEGATIVE Final    Comment: (NOTE) SARS-CoV-2 target nucleic acids are NOT DETECTED. The SARS-CoV-2 RNA is generally detectable in upper and lower respiratory specimens during the acute phase of infection. Negative results do not preclude SARS-CoV-2 infection, do not rule out co-infections with other pathogens, and should not be used as the sole basis for treatment or other patient management decisions. Negative results must be combined with clinical observations, patient history, and epidemiological information. The expected result is Negative. Fact Sheet for Patients: SugarRoll.be Fact Sheet for Healthcare Providers: https://www.woods-mathews.com/ This test is not yet approved or cleared by the [place] FDA and  has been authorized for detection and/or diagnosis of SARS-CoV-2 by FDA under an Emergency Use Authorization (EUA). This EUA will remain  in effect (meaning this test can be used) for the duration of the COVID-19 declaration under Section 56 4(b)(1) of the Act, 21 U.S.C. section 360bbb-3(b)(1), unless the authorization is terminated or revoked sooner. Performed at [facility], [place] [address] [place], [place] [address]     Procedures and diagnostic studies:  CT Head Wo Contrast  Result Date: 06/22/2019 CLINICAL DATA:  Seizures, headache EXAM: CT HEAD WITHOUT CONTRAST TECHNIQUE: Contiguous axial images were obtained from the base of the skull through the vertex without intravenous contrast. COMPARISON:  03/07/2019 FINDINGS: Brain: No acute infarct or hemorrhage. Lateral ventricles and midline structures are stable. No acute extra-axial fluid collections. No mass effect. Vascular: No hyperdense vessel or unexpected calcification. Skull: Normal. Negative for fracture or focal lesion. Sinuses/Orbits: No acute finding. Other: None IMPRESSION: 1. Stable head CT, no acute process. Electronically Signed   By: Randa Ngo M.D.   On: 06/22/2019 20:42   MR BRAIN WO CONTRAST  Result Date: 06/23/2019 CLINICAL DATA:  Initial evaluation for acute seizure. EXAM: MRI HEAD WITHOUT CONTRAST TECHNIQUE: Multiplanar, multiecho pulse sequences of the brain and surrounding structures were obtained without intravenous contrast. COMPARISON:  Prior head CT from 06/22/2019 FINDINGS: Brain: Examination moderately degraded by motion artifact. Generalized age-related cerebral atrophy. Patchy and confluent T2/FLAIR hyperintensity within the periventricular deep white matter both cerebral hemispheres most consistent  with chronic small vessel ischemic disease, moderate in nature. No abnormal foci of restricted diffusion to suggest acute or subacute ischemia. Gray-white matter differentiation maintained. No encephalomalacia to suggest chronic cortical infarction. No definite foci of susceptibility artifact to suggest acute or chronic intracranial hemorrhage. No mass lesion, midline shift or mass effect. No hydrocephalus or extra-axial fluid collection. No obvious intrinsic temporal lobe abnormality. Pituitary gland suprasellar region normal. Midline structures intact. Vascular: Major intracranial vascular flow voids are  maintained. Skull and upper cervical spine: Craniocervical junction within normal limits. Multilevel degenerative spondylosis noted within the upper cervical spine with resultant mild diffuse spinal stenosis. Bone marrow signal intensity normal. No scalp soft tissue abnormality. Sinuses/Orbits: Patient status post ocular lens replacement on the right. Globes and orbital soft tissues demonstrate no acute finding. Paranasal sinuses are largely clear. No mastoid effusion. Other: None. IMPRESSION: 1. No acute intracranial abnormality. 2. Age-related cerebral atrophy with moderate chronic microvascular ischemic disease. Electronically Signed   By: Jeannine Boga M.D.   On: 06/23/2019 03:20   EEG adult  Result Date: 06/23/2019 Lora Havens, MD     06/23/2019 10:09 AM Patient Name: Daniyel Goodie MRN: [id number] Epilepsy Attending: Lora Havens Referring Physician/Provider: Dr Mitzi Hansen Date: 06/23/2019 Duration: 26.15 mins Patient history: : 85 year old male with h/o epilepsy who presented with likely breakthrough seizure with prolonged postictal state/possibly drug effect. EEG to evaluate for seizure. Level of alertness: awake AEDs during EEG study: Keppra, Vimpat, Ativan Technical aspects: This EEG study was done with scalp electrodes positioned according to the 10-20 International system of electrode placement. Electrical activity was acquired at a sampling rate of 500Hz  and reviewed with a high frequency filter of 70Hz  and a low frequency filter of 1Hz . EEG data were recorded continuously and digitally stored. DESCRIPTION: The posterior dominant rhythm consists of 8 Hz activity of moderate voltage (25-35 uV) seen predominantly in posterior head regions, symmetric and reactive to eye opening and eye closing. There is an excessive amount of 15 to 18 Hz, 2-3 uV beta activity with irregular morphology distributed symmetrically and diffusely.  Physiologic photic driving was seen during photic stimulation.   Hyperventilation was not performed.    IMPRESSION: This study is within normal limits. No seizures or epileptiform discharges were seen throughout the recording. Priyanka Barbra Sarks    Medications:   . lacosamide  50 mg Oral BID  . lacosamide  50 mg Oral Once  . levETIRAcetam  1,500 mg Oral BID  . sodium chloride flush  3 mL Intravenous Q12H  . sodium chloride flush  3 mL Intravenous Q12H   Continuous Infusions: . sodium chloride       LOS: 0 days   Radene Gunning NP  [facility]   How to contact the [facility] Attending or Consulting provider [place] or covering provider during after hours [place], for this patient?  1. Check the care team in [facility] and look for a) attending/consulting [facility] provider listed and b) the [facility] team listed 2. Log into [email or web address] and use [place]'s universal password to access. If you do not have the password, please contact the hospital operator. 3. Locate the [facility] provider you are looking for under [facility] and page to a number that you can be directly reached. 4. If you still have difficulty reaching the provider, please page the [facility] (Director on Call) for the Hospitalists listed on amion for assistance.  06/23/2019, 1:30 PM

## 2019-06-23 NOTE — Progress Notes (Signed)
EEG complete - results pending 

## 2019-06-24 MED ORDER — HYDRALAZINE HCL 25 MG PO TABS: 25.0000 mg | ORAL_TABLET | Freq: Three times a day (TID) | ORAL | Status: DC | PRN

## 2019-06-24 MED ORDER — FERROUS SULFATE 325 (65 FE) MG PO TBEC: 325.0000 mg | DELAYED_RELEASE_TABLET | Freq: Two times a day (BID) | ORAL | 5 refills | Status: AC

## 2019-06-24 MED ORDER — PRAVASTATIN SODIUM 80 MG PO TABS: 80.0000 mg | ORAL_TABLET | Freq: Every day | ORAL | 1 refills | Status: DC

## 2019-06-24 MED ORDER — EDOXABAN TOSYLATE 60 MG PO TABS: 60.0000 mg | ORAL_TABLET | ORAL | 11 refills | Status: AC

## 2019-06-24 MED ORDER — LACOSAMIDE 50 MG PO TABS: 50.0000 mg | ORAL_TABLET | Freq: Two times a day (BID) | ORAL | 2 refills | Status: DC

## 2019-06-24 MED ORDER — LISINOPRIL 30 MG PO TABS: 30.0000 mg | ORAL_TABLET | Freq: Every day | ORAL | 1 refills | Status: DC

## 2019-06-24 NOTE — Progress Notes (Signed)
EEG was within normal limits.   MRI brain showed no acute intracranial abnormality. Age-related cerebral atrophy with moderate chronic microvascular ischemic changes were noted  . edoxaban  60 mg Oral Q24H  . lacosamide  50 mg Oral BID  . lacosamide  50 mg Oral Once  . levETIRAcetam  1,500 mg Oral BID  . lisinopril  20 mg Oral Daily  . sodium chloride flush  3 mL Intravenous Q12H  . sodium chloride flush  3 mL Intravenous Q12H     A/R: 85 year old male with breakthrough seizure -- Continue home dose of Keppra at 1500 mg BID -- Continue Vimpat at 50 mg PO BID, which was started this admission -- Will need outpatient Neurology followup -- Neurohospitalist service will sign off. Please call if there are additional questions.   Electronically signed: Dr. Kerney Elbe

## 2019-06-24 NOTE — TOC Benefit Eligibility Note (Signed)
Transition of Care [facility]) Benefit Eligibility Note    Patient Details  Name: [name] MRN: [id number] Date of Birth: Aug 24, 1934   Medication/Dose: Vimpat (Lacosamide) 50mg  BID  Covered?: No        Spoke with Person/Company/Phone Number:: Danielle/[employer]/ [phone]           Additional Notes: This Medication is not on the [employer] Phone Number: 06/24/2019, 9:16 AM
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
## 2019-06-24 NOTE — TOC Transition Note (Signed)
Transition of Care [facility]) - CM/SW Discharge Note   Patient Details  Name: [name] MRN: [id number] Date of Birth: 04/06/1934  Transition of Care [facility]) CM/SW Contact:  Pollie Friar, RN Phone Number: 06/24/2019, 10:37 AM   Clinical Narrative:    Pt discharging home with [facility] services through [facility]. Cassie with [facility] has accepted the referral.  Pt agreeable to walker for home. [employer] to deliver the walker to the room. Pt states his brother will provide needed transportation and supervision at home.  Vimpat 30 day free card provided to the patient. Pt to follow up with his neurologist for coverage.  Pt's brother to provide transport home. Bedside Rn to call brother when pt ready to d/c.   Final next level of care: Home w Home Health Services Barriers to Discharge: No Barriers Identified   Patient Goals and CMS Choice   CMS Medicare.gov Compare Post Acute Care list provided to:: Patient Choice offered to / list presented to : Patient  Discharge Placement                       Discharge Plan and Services                DME Arranged: Walker rolling DME Agency: [employer] Date DME Agency Contacted: 06/24/19 Time DME Agency Contacted: [id number] Representative spoke with at DME Agency: [place]: RN, PT [place] Agency: [facility] [place] Date [place]: 06/24/19 Time [place]: 1037 Representative spoke with at [place]: Cassie  Social Determinants of Health ([place]) Interventions     Readmission Risk Interventions No flowsheet data found.

## 2019-06-25 ENCOUNTER — Telehealth: Payer: Self-pay | Admitting: Neurology

## 2019-06-25 DIAGNOSIS — I10 Essential (primary) hypertension: Secondary | ICD-10-CM | POA: Diagnosis not present

## 2019-06-25 DIAGNOSIS — E119 Type 2 diabetes mellitus without complications: Secondary | ICD-10-CM | POA: Diagnosis not present

## 2019-06-25 DIAGNOSIS — Z7901 Long term (current) use of anticoagulants: Secondary | ICD-10-CM | POA: Diagnosis not present

## 2019-06-25 DIAGNOSIS — D649 Anemia, unspecified: Secondary | ICD-10-CM | POA: Diagnosis not present

## 2019-06-25 DIAGNOSIS — Z86711 Personal history of pulmonary embolism: Secondary | ICD-10-CM | POA: Diagnosis not present

## 2019-06-25 DIAGNOSIS — M542 Cervicalgia: Secondary | ICD-10-CM | POA: Diagnosis not present

## 2019-06-25 DIAGNOSIS — G40909 Epilepsy, unspecified, not intractable, without status epilepticus: Secondary | ICD-10-CM | POA: Diagnosis not present

## 2019-06-25 DIAGNOSIS — C61 Malignant neoplasm of prostate: Secondary | ICD-10-CM | POA: Diagnosis not present

## 2019-06-25 NOTE — Addendum Note (Signed)
Addended by: Desmond Lope on: 06/25/2019 04:38 PM   Modules accepted: Orders

## 2019-06-25 NOTE — Telephone Encounter (Signed)
I returned the call to the patient and he verbalized understanding of the dose increase instructions for Keppra. He will keep his pending appt on 06/26/19.

## 2019-06-25 NOTE — Telephone Encounter (Signed)
Please ask him to take higher dose of Keppra 750 mg 2 tablets in the morning, 3 tablets at nighttime, and keep follow-up appointment

## 2019-06-25 NOTE — Telephone Encounter (Signed)
I called and spoke to the patient. He was seen in ED on 06/22/19 for a seizure event. He is currently taking Keppra 750mg , two tablets BID. Denies missing any medication prior to his seizure. He was given a prescription for Vimpat 50mg , one tab BID when he was discharged. He has been unable to start the medication due to cost. No further episodes since leaving the hospital and he is still taking his Keppra as prescribed. He has an appt in the morning at 9:30am and needs to discuss his medications. His daughter will attend the follow up with him.

## 2019-06-25 NOTE — Telephone Encounter (Signed)
Shelina Taylor(daughter not on DPR)has called stating the hospital put pt on lacosamide (VIMPAT) 50 MG TABS tablet,.  The hospital gave pt a discount card pt daughter states pt did not qualify and that he is looking at having to pay $600.00 for the medication.  Daughter is asking if something else can be called in for pt to take which is less expensive.  Daughter is aware that because she is not on DPR RN will not be able to call her back.  Daughter states that is ok and that pt can be reached at [phone].  Daughter will be bringing pt to appointment tomorrow, she will complete DPR, she still asked that this message be sent so pt is not without medication.

## 2019-06-26 ENCOUNTER — Telehealth: Payer: Self-pay | Admitting: Neurology

## 2019-06-26 ENCOUNTER — Ambulatory Visit (INDEPENDENT_AMBULATORY_CARE_PROVIDER_SITE_OTHER): Payer: Medicare Other | Admitting: Neurology

## 2019-06-26 ENCOUNTER — Encounter: Payer: Self-pay | Admitting: Neurology

## 2019-06-26 ENCOUNTER — Other Ambulatory Visit: Payer: Self-pay

## 2019-06-26 VITALS — BP 153/93 | HR 98 | Temp 97.0°F | Ht 72.0 in | Wt 176.5 lb

## 2019-06-26 DIAGNOSIS — G40909 Epilepsy, unspecified, not intractable, without status epilepticus: Secondary | ICD-10-CM | POA: Diagnosis not present

## 2019-06-26 DIAGNOSIS — R269 Unspecified abnormalities of gait and mobility: Secondary | ICD-10-CM | POA: Diagnosis not present

## 2019-06-26 DIAGNOSIS — I639 Cerebral infarction, unspecified: Secondary | ICD-10-CM | POA: Insufficient documentation

## 2019-06-26 DIAGNOSIS — G40301 Generalized idiopathic epilepsy and epileptic syndromes, not intractable, with status epilepticus: Secondary | ICD-10-CM | POA: Diagnosis not present

## 2019-06-26 DIAGNOSIS — C61 Malignant neoplasm of prostate: Secondary | ICD-10-CM | POA: Diagnosis not present

## 2019-06-26 DIAGNOSIS — E119 Type 2 diabetes mellitus without complications: Secondary | ICD-10-CM | POA: Diagnosis not present

## 2019-06-26 DIAGNOSIS — D649 Anemia, unspecified: Secondary | ICD-10-CM | POA: Diagnosis not present

## 2019-06-26 DIAGNOSIS — G3281 Cerebellar ataxia in diseases classified elsewhere: Secondary | ICD-10-CM | POA: Diagnosis not present

## 2019-06-26 DIAGNOSIS — M542 Cervicalgia: Secondary | ICD-10-CM | POA: Diagnosis not present

## 2019-06-26 DIAGNOSIS — I10 Essential (primary) hypertension: Secondary | ICD-10-CM | POA: Diagnosis not present

## 2019-06-26 LAB — LEVETIRACETAM LEVEL: Levetiracetam Lvl: 54.6 ug/mL — ABNORMAL HIGH (ref 10.0–40.0)

## 2019-06-26 MED ORDER — ASPIRIN EC 81 MG PO TBEC: 81.0000 mg | DELAYED_RELEASE_TABLET | Freq: Every day | ORAL | 11 refills | Status: AC

## 2019-06-26 NOTE — Telephone Encounter (Signed)
medicare order sent to [facility]. No auth they will reach out to the patient to schedule.  

## 2019-06-26 NOTE — Progress Notes (Addendum)
PATIENT: [name] DOB: Jun 05, 1934  Chief Complaint  Patient presents with  . Seizures    Established for seizures. He is here with his sister, [name]. Reports seziure on 06/22/19. He was given Vimpat at the hospital but was unable to afford it. He called our office and his Keppra was increased on 06/25/19.  . 6th Nerve Palsy    New problem. Recent abnormal eye exam. He is having issues with his left eye.  [facility]    [place], [place], [place]     HISTORICAL  [name] is a 85 years old man, seen in request by his primary care PA Cyndi Bender for evaluation of seizure, he was discharged from hospital on March 08, 2019 for recurrent seizure  I saw him initially in June 2014, but has lost follow-up since then,  He had a past medical history of hypertension, hyperlipidemia, diabetes, GERD multiple recurrent seizures,  He woke up one day in May 2014, chewed his tongue, achy all over, was told he had a seizure, he lives with his brother, who also suffered complex partial [name], Anthony ( Feb 9th 1939) is a patient of ours  This is not his first episode, initial one was in 2009, he woke up on his way to hospital on the ambulance, he was found by his brother having a seizure,  He also had one minor episode in April 2014, he woke up with tongue biting, suspicious he might have a seizure as well,  He never had daytime passing out, No history of head trauma, or stroke,  MRI of the brain in December 2012 without contrast for evaluation of seizure-like activity, reported atrophy, small vessel disease, no contrast was given,  EEG in 2012 was normal  He had recurrent seizure on March 07, 2019 during his sleep, woke up at the hospital, home regimen was Dilantin 160 in the morning, 200 at night, Keppra 500 mg twice daily,  Laboratory in December 2020, normal CBC, hemoglobin of 13.6, BMP, glucose of 112, Dilantin level was 12.7, UDS was negative  I personally reviewed CT head  without contrast December 2020, generalized atrophy, supratentorium small vessel disease.  He is now taking Dilantin 30 mg +100 mg, Keppra 750 mg twice a day  Echocardiogram in December 2020 showed ejection fraction 55 to 60%, bilateral atrial size were normal, possible density attached to the anterior mitral valve leaflets,  UPDATE June 26 2019: He is alone at today's clinical visit Since last visit on March 24, 2019, his Keppra dose was increased to 750 mg 2 tablets twice a day, he was weaned off Dilantin  Repeat EEG on April 07, 2019 showed no significant abnormality He presented to emergency room on June 23, 2019 after having seizure at home, his brother noticed that he was behaving unusually, complains of a headache, followed by a generalized seizure,  He was given 1 g of IV Keppra load, personally reviewed CT head without contrast  no acute abnormality, moderate atrophy, supratentorium small vessel disease  Laboratory evaluation showed normal CBC, CMP, magnesium level, negative alcohol, UDS, Dilantin level was less than 2.5, Keppra level was 54.6,  He was noted to have rapid irregular heart rate on today's examination, reviewed extensive cardiology record, he had loop recorder REveal Lin1 LNq11 implant on March 26, 2014, reviewed remote device check from Jan 2016 to April 2019, tachycardia episode, fastest 176 bpm, he is supposed to follow-up with his cardiologist Dr.Croitorum  He also complains of sudden onset  double vision on June 04, 2019, was seen by ophthalmologist Dr. Melissa Noon, suspicious for right 6th nerve palsy,  I personally reviewed MRI of the brain on June 23, 2019, no acute abnormality, moderate atrophy, supratentorium small vessel disease  REVIEW OF SYSTEMS: Full 14 system review of systems performed and notable only for as above All other review of systems were negative.  ALLERGIES: No Known Allergies  HOME MEDICATIONS: Current Outpatient Medications    Medication Sig Dispense Refill  . edoxaban (SAVAYSA) 60 MG TABS tablet Take 60 mg by mouth daily. 30 tablet 11  . ferrous sulfate 325 (65 FE) MG EC tablet Take 1 tablet (325 mg total) by mouth 2 (two) times daily. 60 tablet 5  . levETIRAcetam (KEPPRA) 750 MG tablet Take 2 tablets (1,500 mg total) by mouth 2 (two) times daily. 360 tablet 4  . lisinopril (ZESTRIL) 30 MG tablet Take 1 tablet (30 mg total) by mouth daily. 30 tablet 1  . pravastatin (PRAVACHOL) 80 MG tablet Take 1 tablet (80 mg total) by mouth at bedtime. 30 tablet 1   No current facility-administered medications for this visit.    PAST MEDICAL HISTORY: Past Medical History:  Diagnosis Date  . Anemia   . Cancer [facility])    Prostate  . Cervicalgia   . Essential hypertension, benign   . History of loop recorder 07/14/2014  . Impotence of organic origin   . Insomnia   . Pulmonary embolism ([place])   . Pure hypercholesterolemia   . Saddle pulmonary embolus ([place])   . Seizures ([place])   . Type II or unspecified type diabetes mellitus without mention of complication, not stated as uncontrolled     PAST SURGICAL HISTORY: Past Surgical History:  Procedure Laterality Date  . LOOP RECORDER IMPLANT N/A 03/26/2014   Procedure: LOOP RECORDER IMPLANT;  Surgeon: Sanda Klein, MD;  Location: [facility] CATH LAB;  Service: Cardiovascular;  Laterality: N/A;    FAMILY HISTORY: Family History  Problem Relation Age of Onset  . Cancer Mother   . Cancer Father     SOCIAL HISTORY: Social History   Socioeconomic History  . Marital status: Single    Spouse name: Not on file  . Number of children: Not on file  . Years of education: 50  . Highest education level: Not on file  Occupational History  . Not on file  Tobacco Use  . Smoking status: Former Research scientist (life sciences)  . Smokeless tobacco: Never Used  Substance and Sexual Activity  . Alcohol use: No  . Drug use: No  . Sexual activity: Not on file  Other Topics Concern  . Not on file  Social  History Narrative   He lives with his brother, retired, driving, no smoking, no drinking.   Social Determinants of Health   Financial Resource Strain:   . Difficulty of Paying Living Expenses:   Food Insecurity:   . Worried About Charity fundraiser in the Last Year:   . Arboriculturist in the Last Year:   Transportation Needs:   . Film/video editor (Medical):   Marland Kitchen Lack of Transportation (Non-Medical):   Physical Activity:   . Days of Exercise per Week:   . Minutes of Exercise per Session:   Stress:   . Feeling of Stress :   Social Connections:   . Frequency of Communication with Friends and Family:   . Frequency of Social Gatherings with Friends and Family:   . Attends Religious Services:   . Active  Member of Clubs or Organizations:   . Attends Archivist Meetings:   Marland Kitchen Marital Status:   Intimate Partner Violence:   . Fear of Current or Ex-Partner:   . Emotionally Abused:   Marland Kitchen Physically Abused:   . Sexually Abused:      PHYSICAL EXAM   Vitals:   06/26/19 0935  BP: (!) 153/93  Pulse: 98  Temp: (!) 97 F (36.1 C)  Weight: 176 lb 8 oz (80.1 kg)  Height: 6' (1.829 m)    Not recorded      Body mass index is 23.94 kg/m.  PHYSICAL EXAMNIATION:  Gen: NAD, conversant, well nourised, well groomed                     Cardiovascular: Regular rate rhythm, no peripheral edema, warm, nontender. Eyes: Conjunctivae clear without exudates or hemorrhage Neck: Supple, no carotid bruits. Pulmonary: Clear to auscultation bilaterally   NEUROLOGICAL EXAM:  MENTAL STATUS: Speech:    Speech is normal; fluent and spontaneous with normal comprehension.  Cognition:     Orientation to time, place and person     Normal recent and remote memory     Normal Attention span and concentration     Normal Language, naming, repeating,spontaneous speech     Fund of knowledge   CRANIAL NERVES: CN II: Visual fields are full to confrontation. Pupils are round equal and  briskly reactive to light. CN III, IV, VI: Cover and uncover showed right superior/exophoria, left inferior/exophoria CN V: Facial sensation is intact to light touch CN VII: Face is symmetric with normal eye closure  CN VIII: Hearing is normal to causal conversation. CN IX, X: Phonation is normal. CN XI: Head turning and shoulder shrug are intact  MOTOR: There is no pronator drift of out-stretched arms. Muscle bulk and tone are normal. Muscle strength is normal.  REFLEXES: Reflexes are 2+ and symmetric at the biceps, triceps, knees, and ankles. Plantar responses are flexor.  SENSORY: Intact to light touch, pinprick and vibratory sensation are intact in fingers and toes.  COORDINATION: There is no trunk or limb dysmetria noted.  GAIT/STANCE: Dragging his right leg, he contributed to his previous right leg wound, right foot deformity   DIAGNOSTIC DATA (LABS, IMAGING, TESTING) - I reviewed patient records, labs, notes, testing and imaging myself where available.   ASSESSMENT AND PLAN  Meryle Pacifico is a 85 y.o. 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 Ph.D.  [facility] [address], [place], [place] [address] Ph: [phone] Fax: [phone]  CC: Referring Provider

## 2019-07-01 NOTE — Discharge Summary (Signed)
Physician Discharge Summary  [name] [id number] DOB: [birth year] DOA: 06/22/2019  PCP: Cyndi Bender, PA-C  Admit date: 06/22/2019 Discharge date: 06/24/2019  Admitted From: Home.  Disposition:  Home.   Recommendations for Outpatient Follow-up:  1. Follow up with PCP in 1-2 weeks 2. Please obtain BMP/CBC in one week Please follow up with neurology as recommended.   Discharge Condition:stable.  CODE STATUS: full code.  Diet recommendation: Heart Healthy /  Brief/Interim Summary: [name] is a 85 y.o. male with medical history significant for hypertension, prostate cancer, and seizure disorder, now presenting to the emergency department for evaluation of headache and reported generalized seizure at home.  Patient seemed to be behaving unusually per his brother, complained of a headache, and then had witnessed generalized seizure like activity.   Discharge Diagnoses:  Principal Problem:   Seizures ([place]) Active Problems:   Hx pulmonary embolism   HTN (hypertension)  Seizures  - Patient follows with neurology for seizure disorder, stopped Dilantin and had Keppra increased 3 months ago, his brother witnessed generalized seizure on admission.  MRI brain does not show any intracranial abnormality.  He was discharged on keppra and Vimpat   2. Hypertension   Well controlled.   3. History of PE  - No evidence for acute VTE  - Patient had been on edoxaban, continue the same.  Discharge Instructions  Discharge Instructions    Diet - low sodium heart healthy   Complete by: As directed    Discharge instructions   Complete by: As directed    Please follow up with Neurology in 1 to 2 weeks.     Allergies as of 06/24/2019   No Known Allergies     Medication List    STOP taking these medications   phenytoin 100 MG ER capsule Commonly known as: DILANTIN   phenytoin 30 MG ER capsule Commonly known as: DILANTIN     TAKE these medications   edoxaban 60 MG Tabs  tablet Commonly known as: SAVAYSA Take 60 mg by mouth daily.   ferrous sulfate 325 (65 FE) MG EC tablet Take 1 tablet (325 mg total) by mouth 2 (two) times daily.   levETIRAcetam 750 MG tablet Commonly known as: KEPPRA Take 2 tablets (1,500 mg total) by mouth 2 (two) times daily.   lisinopril 30 MG tablet Commonly known as: ZESTRIL Take 1 tablet (30 mg total) by mouth daily.   pravastatin 80 MG tablet Commonly known as: PRAVACHOL Take 1 tablet (80 mg total) by mouth at bedtime.      Follow-up Information    Call  Marcial Pacas, MD.   Specialty: Neurology Contact information: [address] [place] [address] [phone]        [facility] Follow up.   Specialty: Home Health Services Why: The home health agency will contact you for the first home visit. Contact information: [place] [phone]          No Known Allergies  Consultations:  Neurology.    Procedures/Studies: CT Head Wo Contrast  Result Date: 06/22/2019 CLINICAL DATA:  Seizures, headache EXAM: CT HEAD WITHOUT CONTRAST TECHNIQUE: Contiguous axial images were obtained from the base of the skull through the vertex without intravenous contrast. COMPARISON:  03/07/2019 FINDINGS: Brain: No acute infarct or hemorrhage. Lateral ventricles and midline structures are stable. No acute extra-axial fluid collections. No mass effect. Vascular: No hyperdense vessel or unexpected calcification. Skull: Normal. Negative for fracture or focal lesion. Sinuses/Orbits:  No acute finding. Other: None IMPRESSION: 1. Stable head CT, no acute process. Electronically Signed   By: Randa Ngo M.D.   On: 06/22/2019 20:42   MR BRAIN WO CONTRAST  Result Date: 06/23/2019 CLINICAL DATA:  Initial evaluation for acute seizure. EXAM: MRI HEAD WITHOUT CONTRAST TECHNIQUE: Multiplanar, multiecho pulse sequences of the brain and surrounding structures were obtained without intravenous  contrast. COMPARISON:  Prior head CT from 06/22/2019 FINDINGS: Brain: Examination moderately degraded by motion artifact. Generalized age-related cerebral atrophy. Patchy and confluent T2/FLAIR hyperintensity within the periventricular deep white matter both cerebral hemispheres most consistent with chronic small vessel ischemic disease, moderate in nature. No abnormal foci of restricted diffusion to suggest acute or subacute ischemia. Gray-white matter differentiation maintained. No encephalomalacia to suggest chronic cortical infarction. No definite foci of susceptibility artifact to suggest acute or chronic intracranial hemorrhage. No mass lesion, midline shift or mass effect. No hydrocephalus or extra-axial fluid collection. No obvious intrinsic temporal lobe abnormality. Pituitary gland suprasellar region normal. Midline structures intact. Vascular: Major intracranial vascular flow voids are maintained. Skull and upper cervical spine: Craniocervical junction within normal limits. Multilevel degenerative spondylosis noted within the upper cervical spine with resultant mild diffuse spinal stenosis. Bone marrow signal intensity normal. No scalp soft tissue abnormality. Sinuses/Orbits: Patient status post ocular lens replacement on the right. Globes and orbital soft tissues demonstrate no acute finding. Paranasal sinuses are largely clear. No mastoid effusion. Other: None. IMPRESSION: 1. No acute intracranial abnormality. 2. Age-related cerebral atrophy with moderate chronic microvascular ischemic disease. Electronically Signed   By: Jeannine Boga M.D.   On: 06/23/2019 03:20   EEG adult  Result Date: 06/23/2019 Lora Havens, MD     06/23/2019 10:09 AM Patient Name: [name] MRN: [id number] Epilepsy Attending: Lora Havens Referring Physician/Provider: Dr Mitzi Hansen Date: 06/23/2019 Duration: 26.15 mins Patient history: : 85 year old male with h/o epilepsy who presented with likely breakthrough  seizure with prolonged postictal state/possibly drug effect. EEG to evaluate for seizure. Level of alertness: awake AEDs during EEG study: Keppra, Vimpat, Ativan Technical aspects: This EEG study was done with scalp electrodes positioned according to the 10-20 International system of electrode placement. Electrical activity was acquired at a sampling rate of 500Hz  and reviewed with a high frequency filter of 70Hz  and a low frequency filter of 1Hz . EEG data were recorded continuously and digitally stored. DESCRIPTION: The posterior dominant rhythm consists of 8 Hz activity of moderate voltage (25-35 uV) seen predominantly in posterior head regions, symmetric and reactive to eye opening and eye closing. There is an excessive amount of 15 to 18 Hz, 2-3 uV beta activity with irregular morphology distributed symmetrically and diffusely.  Physiologic photic driving was seen during photic stimulation.  Hyperventilation was not performed.    IMPRESSION: This study is within normal limits. No seizures or epileptiform discharges were seen throughout the recording. Priyanka Barbra Sarks       Subjective: No new complaints.   Discharge Exam: Vitals:   06/24/19 0803 06/24/19 0917  BP:    Pulse:  (!) 106  Resp: 20   Temp:    SpO2:  99%   Vitals:   06/24/19 0349 06/24/19 0800 06/24/19 0803 06/24/19 0917  BP: 140/67 (!) 171/93    Pulse: 80 99  (!) 106  Resp: 18 16 20    Temp: 98 F (36.7 C) 98.1 F (36.7 C)    TempSrc: Oral Oral    SpO2: 98% 98%  99%    General: Pt  is alert, awake, not in acute distress Cardiovascular: RRR, S1/S2 +, no rubs, no gallops Respiratory: CTA bilaterally, no wheezing, no rhonchi Abdominal: Soft, NT, ND, bowel sounds + Extremities: no edema, no cyanosis    The results of significant diagnostics from this hospitalization (including imaging, microbiology, ancillary and laboratory) are listed below for reference.     Microbiology: Recent Results (from the past 240 hour(s))   SARS CORONAVIRUS 2 (TAT 6-24 HRS) Nasopharyngeal Nasopharyngeal Swab     Status: None   Collection Time: 06/23/19  1:09 AM   Specimen: Nasopharyngeal Swab  Result Value Ref Range Status   SARS Coronavirus 2 NEGATIVE NEGATIVE Final    Comment: (NOTE) SARS-CoV-2 target nucleic acids are NOT DETECTED. The SARS-CoV-2 RNA is generally detectable in upper and lower respiratory specimens during the acute phase of infection. Negative results do not preclude SARS-CoV-2 infection, do not rule out co-infections with other pathogens, and should not be used as the sole basis for treatment or other patient management decisions. Negative results must be combined with clinical observations, patient history, and epidemiological information. The expected result is Negative. Fact Sheet for Patients: SugarRoll.be Fact Sheet for Healthcare Providers: https://www.woods-mathews.com/ This test is not yet approved or cleared by the [place] FDA and  has been authorized for detection and/or diagnosis of SARS-CoV-2 by FDA under an Emergency Use Authorization (EUA). This EUA will remain  in effect (meaning this test can be used) for the duration of the COVID-19 declaration under Section 56 4(b)(1) of the Act, 21 U.S.C. section 360bbb-3(b)(1), unless the authorization is terminated or revoked sooner. Performed at [facility], [place] [address] [place],  [address]      Labs: BNP (last 3 results) No results for input(s): BNP in the last 8760 hours. Basic Metabolic Panel: No results for input(s): NA, K, CL, CO2, GLUCOSE, BUN, CREATININE, CALCIUM, MG, PHOS in the last 168 hours. Liver Function Tests: No results for input(s): AST, ALT, ALKPHOS, BILITOT, PROT, ALBUMIN in the last 168 hours. No results for input(s): LIPASE, AMYLASE in the last 168 hours. No results for input(s): AMMONIA in the last 168 hours. CBC: No results for input(s): WBC,  NEUTROABS, HGB, HCT, MCV, PLT in the last 168 hours. Cardiac Enzymes: No results for input(s): CKTOTAL, CKMB, CKMBINDEX, TROPONINI in the last 168 hours. BNP: Invalid input(s): POCBNP CBG: No results for input(s): GLUCAP in the last 168 hours. D-Dimer No results for input(s): DDIMER in the last 72 hours. Hgb A1c No results for input(s): HGBA1C in the last 72 hours. Lipid Profile No results for input(s): CHOL, HDL, LDLCALC, TRIG, CHOLHDL, LDLDIRECT in the last 72 hours. Thyroid function studies No results for input(s): TSH, T4TOTAL, T3FREE, THYROIDAB in the last 72 hours.  Invalid input(s): FREET3 Anemia work up No results for input(s): VITAMINB12, FOLATE, FERRITIN, TIBC, IRON, RETICCTPCT in the last 72 hours. Urinalysis    Component Value Date/Time   COLORURINE YELLOW 06/22/2019 2120   APPEARANCEUR CLEAR 06/22/2019 2120   LABSPEC 1.015 06/22/2019 2120   PHURINE 5.0 06/22/2019 2120   GLUCOSEU NEGATIVE 06/22/2019 2120   HGBUR MODERATE (A) 06/22/2019 2120   BILIRUBINUR NEGATIVE 06/22/2019 2120   KETONESUR NEGATIVE 06/22/2019 2120   PROTEINUR 100 (A) 06/22/2019 2120   UROBILINOGEN 0.2 03/24/2014 1426   NITRITE NEGATIVE 06/22/2019 2120   LEUKOCYTESUR NEGATIVE 06/22/2019 2120   Sepsis Labs Invalid input(s): PROCALCITONIN,  WBC,  LACTICIDVEN Microbiology Recent Results (from the past 240 hour(s))  SARS CORONAVIRUS 2 (TAT 6-24 HRS) Nasopharyngeal  Nasopharyngeal Swab     Status: None   Collection Time: 06/23/19  1:09 AM   Specimen: Nasopharyngeal Swab  Result Value Ref Range Status   SARS Coronavirus 2 NEGATIVE NEGATIVE Final    Comment: (NOTE) SARS-CoV-2 target nucleic acids are NOT DETECTED. The SARS-CoV-2 RNA is generally detectable in upper and lower respiratory specimens during the acute phase of infection. Negative results do not preclude SARS-CoV-2 infection, do not rule out co-infections with other pathogens, and should not be used as the sole basis for treatment or  other patient management decisions. Negative results must be combined with clinical observations, patient history, and epidemiological information. The expected result is Negative. Fact Sheet for Patients: SugarRoll.be Fact Sheet for Healthcare Providers: https://www.woods-mathews.com/ This test is not yet approved or cleared by the [place] FDA and  has been authorized for detection and/or diagnosis of SARS-CoV-2 by FDA under an Emergency Use Authorization (EUA). This EUA will remain  in effect (meaning this test can be used) for the duration of the COVID-19 declaration under Section 56 4(b)(1) of the Act, 21 U.S.C. section 360bbb-3(b)(1), unless the authorization is terminated or revoked sooner. Performed at [facility], [place] [address], [place], [place] [address]      Time coordinating discharge: 31 minutes  SIGNED:   Hosie Poisson, MD  [facility]

## 2019-07-22 ENCOUNTER — Ambulatory Visit
Admission: RE | Admit: 2019-07-22 | Discharge: 2019-07-22 | Disposition: A | Discharge status: Home / Self Care | Payer: Medicare Other | Source: Ambulatory Visit | Attending: Neurology | Admitting: Neurology

## 2019-07-22 ENCOUNTER — Telehealth: Payer: Self-pay | Admitting: Neurology

## 2019-07-22 DIAGNOSIS — G3281 Cerebellar ataxia in diseases classified elsewhere: Secondary | ICD-10-CM

## 2019-07-22 DIAGNOSIS — I6523 Occlusion and stenosis of bilateral carotid arteries: Secondary | ICD-10-CM | POA: Diagnosis not present

## 2019-07-22 DIAGNOSIS — R270 Ataxia, unspecified: Secondary | ICD-10-CM | POA: Diagnosis not present

## 2019-07-22 MED ORDER — IOPAMIDOL (ISOVUE-300) INJECTION 61%
100.0000 mL | Freq: Once | INTRAVENOUS | Status: AC | PRN
  Administered 2019-07-22: 100 mL via INTRAVENOUS

## 2019-07-22 NOTE — Telephone Encounter (Signed)
  IMPRESSION: 1. No acute intracranial abnormality. Atrophy and chronic microvascular ischemic change in the white matter. 2. No significant carotid or vertebral artery stenosis in the neck. Mild atherosclerotic disease in both carotid arteries and right vertebral artery. 3. No intracranial large vessel occlusion or flow limiting stenosis.  Please call patient, CT angiogram of head and neck showed no significant large vessel disease.  Continue aspirin 81 mg daily

## 2019-07-22 NOTE — Telephone Encounter (Signed)
I spoke to the patient's daughter on [place]. She verbalized understanding of the results below and will make sure he continues his daily aspirin 81mg .

## 2019-07-25 DIAGNOSIS — G40909 Epilepsy, unspecified, not intractable, without status epilepticus: Secondary | ICD-10-CM | POA: Diagnosis not present

## 2019-08-02 ENCOUNTER — Other Ambulatory Visit: Payer: Self-pay | Admitting: Internal Medicine

## 2019-08-09 ENCOUNTER — Emergency Department (HOSPITAL_COMMUNITY): Payer: Medicare Other

## 2019-08-09 ENCOUNTER — Inpatient Hospital Stay (HOSPITAL_COMMUNITY)
Admission: EM | Admit: 2019-08-09 | Discharge: 2019-08-11 | DRG: 065 | Disposition: A | Discharge status: Home / Self Care | Payer: Medicare Other | Attending: Internal Medicine | Admitting: Internal Medicine

## 2019-08-09 ENCOUNTER — Other Ambulatory Visit: Payer: Self-pay

## 2019-08-09 ENCOUNTER — Observation Stay (HOSPITAL_COMMUNITY): Payer: Medicare Other

## 2019-08-09 ENCOUNTER — Encounter (HOSPITAL_COMMUNITY): Payer: Self-pay

## 2019-08-09 DIAGNOSIS — H4921 Sixth [abducent] nerve palsy, right eye: Secondary | ICD-10-CM | POA: Diagnosis present

## 2019-08-09 DIAGNOSIS — Z8546 Personal history of malignant neoplasm of prostate: Secondary | ICD-10-CM

## 2019-08-09 DIAGNOSIS — Z20822 Contact with and (suspected) exposure to covid-19: Secondary | ICD-10-CM | POA: Diagnosis not present

## 2019-08-09 DIAGNOSIS — I169 Hypertensive crisis, unspecified: Secondary | ICD-10-CM | POA: Diagnosis not present

## 2019-08-09 DIAGNOSIS — I639 Cerebral infarction, unspecified: Secondary | ICD-10-CM

## 2019-08-09 DIAGNOSIS — Z7902 Long term (current) use of antithrombotics/antiplatelets: Secondary | ICD-10-CM

## 2019-08-09 DIAGNOSIS — Z86711 Personal history of pulmonary embolism: Secondary | ICD-10-CM

## 2019-08-09 DIAGNOSIS — Z7982 Long term (current) use of aspirin: Secondary | ICD-10-CM

## 2019-08-09 DIAGNOSIS — Z03818 Encounter for observation for suspected exposure to other biological agents ruled out: Secondary | ICD-10-CM | POA: Diagnosis not present

## 2019-08-09 DIAGNOSIS — R4701 Aphasia: Secondary | ICD-10-CM | POA: Diagnosis present

## 2019-08-09 DIAGNOSIS — I10 Essential (primary) hypertension: Secondary | ICD-10-CM | POA: Diagnosis not present

## 2019-08-09 DIAGNOSIS — R569 Unspecified convulsions: Secondary | ICD-10-CM

## 2019-08-09 DIAGNOSIS — Z809 Family history of malignant neoplasm, unspecified: Secondary | ICD-10-CM

## 2019-08-09 DIAGNOSIS — D509 Iron deficiency anemia, unspecified: Secondary | ICD-10-CM | POA: Diagnosis present

## 2019-08-09 DIAGNOSIS — R262 Difficulty in walking, not elsewhere classified: Secondary | ICD-10-CM | POA: Diagnosis present

## 2019-08-09 DIAGNOSIS — Z79899 Other long term (current) drug therapy: Secondary | ICD-10-CM

## 2019-08-09 DIAGNOSIS — R4781 Slurred speech: Secondary | ICD-10-CM | POA: Diagnosis not present

## 2019-08-09 DIAGNOSIS — E785 Hyperlipidemia, unspecified: Secondary | ICD-10-CM | POA: Diagnosis not present

## 2019-08-09 DIAGNOSIS — R479 Unspecified speech disturbances: Secondary | ICD-10-CM | POA: Diagnosis not present

## 2019-08-09 DIAGNOSIS — G40909 Epilepsy, unspecified, not intractable, without status epilepticus: Secondary | ICD-10-CM | POA: Diagnosis present

## 2019-08-09 DIAGNOSIS — R471 Dysarthria and anarthria: Secondary | ICD-10-CM | POA: Diagnosis present

## 2019-08-09 DIAGNOSIS — I6340 Cerebral infarction due to embolism of unspecified cerebral artery: Secondary | ICD-10-CM | POA: Diagnosis not present

## 2019-08-09 DIAGNOSIS — Z87891 Personal history of nicotine dependence: Secondary | ICD-10-CM

## 2019-08-09 DIAGNOSIS — I69398 Other sequelae of cerebral infarction: Secondary | ICD-10-CM

## 2019-08-09 DIAGNOSIS — Z7901 Long term (current) use of anticoagulants: Secondary | ICD-10-CM

## 2019-08-09 DIAGNOSIS — R410 Disorientation, unspecified: Secondary | ICD-10-CM | POA: Diagnosis not present

## 2019-08-09 DIAGNOSIS — R4182 Altered mental status, unspecified: Secondary | ICD-10-CM | POA: Diagnosis present

## 2019-08-09 DIAGNOSIS — E119 Type 2 diabetes mellitus without complications: Secondary | ICD-10-CM | POA: Diagnosis present

## 2019-08-09 DIAGNOSIS — R29810 Facial weakness: Secondary | ICD-10-CM | POA: Diagnosis not present

## 2019-08-09 LAB — COMPREHENSIVE METABOLIC PANEL
ALT: 13 U/L (ref 0–44)
AST: 18 U/L (ref 15–41)
Albumin: 4 g/dL (ref 3.5–5.0)
Alkaline Phosphatase: 80 U/L (ref 38–126)
Anion gap: 7 (ref 5–15)
BUN: 9 mg/dL (ref 8–23)
CO2: 28 mmol/L (ref 22–32)
Calcium: 9.2 mg/dL (ref 8.9–10.3)
Chloride: 105 mmol/L (ref 98–111)
Creatinine, Ser: 0.99 mg/dL (ref 0.61–1.24)
GFR calc Af Amer: >60 mL/min (ref >60)
GFR calc non Af Amer: >60 mL/min (ref >60)
Glucose, Bld: 117 mg/dL — ABNORMAL HIGH (ref 70–99)
Potassium: 4.2 mmol/L (ref 3.5–5.1)
Sodium: 140 mmol/L (ref 135–145)
Total Bilirubin: 0.5 mg/dL (ref 0.3–1.2)
Total Protein: 7.1 g/dL (ref 6.5–8.1)

## 2019-08-09 LAB — CBC
HCT: 45.9 % (ref 39.0–52.0)
Hemoglobin: 14.7 g/dL (ref 13.0–17.0)
MCH: 31.9 pg (ref 26.0–34.0)
MCHC: 32 g/dL (ref 30.0–36.0)
MCV: 99.6 fL (ref 80.0–100.0)
Platelets: 195 10*3/uL (ref 150–400)
RBC: 4.61 MIL/uL (ref 4.22–5.81)
RDW: 12.5 % (ref 11.5–15.5)
WBC: 4.3 10*3/uL (ref 4.0–10.5)
nRBC: 0 % (ref 0.0–0.2)

## 2019-08-09 LAB — RAPID URINE DRUG SCREEN, HOSP PERFORMED
Amphetamines: NOT DETECTED
Barbiturates: NOT DETECTED
Benzodiazepines: NOT DETECTED
Cocaine: NOT DETECTED
Opiates: NOT DETECTED
Tetrahydrocannabinol: NOT DETECTED

## 2019-08-09 LAB — CBG MONITORING, ED: Glucose-Capillary: 95 mg/dL (ref 70–99)

## 2019-08-09 LAB — URINALYSIS, ROUTINE W REFLEX MICROSCOPIC
Bilirubin Urine: NEGATIVE
Glucose, UA: NEGATIVE mg/dL
Hgb urine dipstick: NEGATIVE
Ketones, ur: NEGATIVE mg/dL
Leukocytes,Ua: NEGATIVE
Nitrite: NEGATIVE
Protein, ur: NEGATIVE mg/dL
Specific Gravity, Urine: 1.017 (ref 1.005–1.030)
pH: 7 (ref 5.0–8.0)

## 2019-08-09 LAB — SARS CORONAVIRUS 2 BY RT PCR (HOSPITAL ORDER, PERFORMED IN ~~LOC~~ HOSPITAL LAB): SARS Coronavirus 2: NEGATIVE

## 2019-08-09 LAB — ETHANOL: Alcohol, Ethyl (B): <10 mg/dL (ref <10)

## 2019-08-09 LAB — PROTIME-INR
INR: 1 (ref 0.8–1.2)
Prothrombin Time: 12.7 seconds (ref 11.4–15.2)

## 2019-08-09 LAB — AMMONIA: Ammonia: 25 umol/L (ref 9–35)

## 2019-08-09 MED ORDER — LEVETIRACETAM 750 MG PO TABS: 1500.0000 mg | ORAL_TABLET | Freq: Every day | ORAL | Status: DC

## 2019-08-09 MED ORDER — ACETAMINOPHEN 325 MG PO TABS: 650.0000 mg | ORAL_TABLET | ORAL | Status: DC | PRN

## 2019-08-09 MED ORDER — HEPARIN (PORCINE) 25000 UT/250ML-% IV SOLN
1100.0000 [IU]/h | INTRAVENOUS | Status: DC
  Administered 2019-08-09: 1200 [IU]/h via INTRAVENOUS
  Filled 2019-08-09: qty 250

## 2019-08-09 MED ORDER — STROKE: EARLY STAGES OF RECOVERY BOOK
Freq: Once | Status: AC
  Administered 2019-08-10: 1
  Filled 2019-08-09: qty 1

## 2019-08-09 MED ORDER — SODIUM CHLORIDE 0.9% FLUSH
3.0000 mL | Freq: Once | INTRAVENOUS | Status: AC
  Administered 2019-08-09: 3 mL via INTRAVENOUS

## 2019-08-09 MED ORDER — LEVETIRACETAM 750 MG PO TABS: 2250.0000 mg | ORAL_TABLET | Freq: Every day | ORAL | Status: DC

## 2019-08-09 MED ORDER — LEVETIRACETAM IN NACL 1500 MG/100ML IV SOLN
1500.0000 mg | Freq: Two times a day (BID) | INTRAVENOUS | Status: DC
  Administered 2019-08-09 – 2019-08-10 (×2): 1500 mg via INTRAVENOUS
  Filled 2019-08-09 (×4): qty 100

## 2019-08-09 MED ORDER — ACETAMINOPHEN 650 MG RE SUPP: 650.0000 mg | RECTAL | Status: DC | PRN

## 2019-08-09 MED ORDER — ACETAMINOPHEN 160 MG/5ML PO SOLN: 650.0000 mg | ORAL | Status: DC | PRN

## 2019-08-09 MED ORDER — LEVETIRACETAM 750 MG PO TABS
750.0000 mg | ORAL_TABLET | Freq: Once | ORAL | Status: DC
  Filled 2019-08-09: qty 1

## 2019-08-09 MED ORDER — SENNOSIDES-DOCUSATE SODIUM 8.6-50 MG PO TABS: 1.0000 | ORAL_TABLET | Freq: Every evening | ORAL | Status: DC | PRN

## 2019-08-09 NOTE — Consult Note (Signed)
Neurology Consultation Reason for Consult: Stroke Referring Physician: Little, R  CC: Difficulty speaking  History is obtained from: Patient  HPI: [name] is a 85 y.o. male who was in his normal state of health when he went to bed last night.  When he woke this morning, he had a very difficult time speaking.  He was unable to form his words, and states that in addition to slurring, he was also unable to get the words to his mouth.  He denies any numbness or weakness.  He was told about a month ago that he had an "stroke in his eye."  He had double vision that improved with covering 1 eye, he was apparently told by someone that it had a "stroke."  I suspect this might have been a microvascular partial nerve palsy, but I do not see evidence of this on exam today.    LKW: 5/14 tpa given?: no, outside of window    ROS: A 14 point ROS was performed and is negative except as noted in the HPI.   Past Medical History:  Diagnosis Date  . Anemia   . Cancer [facility])    Prostate  . Cervicalgia   . Essential hypertension, benign   . History of loop recorder 07/14/2014  . Impotence of organic origin   . Insomnia   . Pulmonary embolism ([place])   . Pure hypercholesterolemia   . Saddle pulmonary embolus ([place])   . Seizures ([place])   . Type II or unspecified type diabetes mellitus without mention of complication, not stated as uncontrolled      Family History  Problem Relation Age of Onset  . Cancer Mother   . Cancer Father      Social History:  reports that he has quit smoking. He has never used smokeless tobacco. He reports that he does not drink alcohol or use drugs.   Exam: Current vital signs: BP (!) 174/99   Pulse 93   Temp 98.7 F (37.1 C)   Resp 14   Ht 6' (1.829 m)   Wt 79.8 kg   SpO2 99%   BMI 23.87 kg/m  Vital signs in last 24 hours: Temp:  [98.7 F (37.1 C)] 98.7 F (37.1 C) (05/15 1245) Pulse Rate:  [69-97] 93 (05/15 1830) Resp:  [14-26] 14 (05/15 1830) BP:  (174-189)/(96-110) 174/99 (05/15 1713) SpO2:  [95 %-100 %] 99 % (05/15 1830) Weight:  [79.8 kg] 79.8 kg (05/15 1240)   Physical Exam  Constitutional: Appears well-developed and well-nourished.  Psych: Affect appropriate to situation Eyes: No scleral injection HENT: No OP obstrucion MSK: no joint deformities.  Cardiovascular: Normal rate and regular rhythm.  Respiratory: Effort normal, non-labored breathing GI: Soft.  No distension. There is no tenderness.  Skin: WDI  Neuro: Mental Status: Patient is awake, alert, oriented to person, place, month, year, and situation. Patient is able to give a clear and coherent history. No signs of neglect, he does appear to have a slight hesitancy to his speech, but is able to name objects and able to repeat. Cranial Nerves: II: Visual Fields are full. Pupils are slightly unequal , left is slightly larger and irregular than right III,IV, VI: EOMI without ptosis or diploplia.  V: Facial sensation is symmetric to temperature VII: Facial movement with slight decreased nasolabial fold on the right, mild dysarthria VIII: hearing is intact to voice X: Uvula elevates symmetrically XI: Shoulder shrug is symmetric. XII: tongue is midline without atrophy or fasciculations.  Motor: Tone is  normal. Bulk is normal. 5/5 strength was present to confrontation, however he does have mild right pronator drift Sensory: Sensation is symmetric to light touch and temperature in the arms and legs. Cerebellar: No clear ataxia    I have reviewed labs in epic and the results pertinent to this consultation are: Creatinine 0.99, CMP otherwise unremarkable  I have reviewed the images obtained: MRI brain-distal cortical infarct on the left  Impression: 85 year old male with acute cortical infarct.  Suspect this likely represents an embolic event, he is already on anticoagulation with edoxaban for PE.  If he were to be found to have an embolic source, edoxaban is not  adequate to prevent strokes normal renal function.  There may be some slight increased risk of hemorrhage with the stroke, it is small and I think the risk is low.  Low-dose heparin could be used without a bolus.  Recommendations: - HgbA1c, fasting lipid panel - MRI, MRA  of the brain without contrast - Frequent neuro checks - Echocardiogram - Carotid dopplers - Prophylactic therapy-heparin, stroke protocol - Risk factor modification - Telemetry monitoring - PT consult, OT consult, Speech consult - Stroke team to follow - continue home keppra.    Roland Rack, MD [facility] [phone]  If 7pm- 7am, please page neurology on call as listed in [place].

## 2019-08-09 NOTE — Progress Notes (Signed)
ANTICOAGULATION CONSULT NOTE - Initial Consult  Pharmacy Consult for heparin Indication: H/o PE  No Known Allergies  Patient Measurements: Height: 6' (182.9 cm) Weight: 79.8 kg (176 lb) IBW/kg (Calculated) : 77.6 Heparin Dosing Weight: 79 kg   Vital Signs: Temp: 98.7 F (37.1 C) (05/15 1245) BP: 174/99 (05/15 1713) Pulse Rate: 97 (05/15 1713)  Labs: Recent Labs    08/09/19 1250  HGB 14.7  HCT 45.9  PLT 195  LABPROT 12.7  INR 1.0  CREATININE 0.99    Estimated Creatinine Clearance: 63.1 mL/min (by C-G formula based on SCr of 0.99 mg/dL).   Medical History: Past Medical History:  Diagnosis Date  . Anemia   . Cancer [facility])    Prostate  . Cervicalgia   . Essential hypertension, benign   . History of loop recorder 07/14/2014  . Impotence of organic origin   . Insomnia   . Pulmonary embolism ([place])   . Pure hypercholesterolemia   . [place] pulmonary embolus ([place])   . Seizures ([place])   . Type II or unspecified type diabetes mellitus without mention of complication, not stated as uncontrolled     Medications:  (Not in a hospital admission)   Assessment: 46 YOF on edoxaban for h/o PE here with new acute ischemic stroke on MRI. Pharmacy consulted to transition to IV heparin. Discussed case with neurology. H/H and Plt wnl.   Of note, last dose of edoxaban was yesterday evening.   Goal of Therapy:  Heparin level 0.3-0.5 units/ml aPTT 66-85 seconds Monitor platelets by anticoagulation protocol: Yes   Plan:  -Start IV heparin at 1200 units/hr. No bolus due to stroke  -F/u 8 hr HL, aPTT -Monitor daily HL, aPTT, CBC and s/s of bleeding   Albertina Parr, PharmD., BCPS, BCCCP Clinical Pharmacist Clinical phone for 08/09/19 until 10pm: [phone] If after 10pm, please refer to [facility] for unit-specific pharmacist

## 2019-08-09 NOTE — ED Provider Notes (Signed)
Received patient in signout from Dr. Rex Kras, briefly patient with aphasia.  Discussed with neuro recommends MRI.  If negative likely outpatient neuro follow up.   Unfortunately the MRI shows an acute stroke.  I discussed this with Dr. Saralyn Pilar who will come evaluate the patient at bedside.  Recommended medical admission.     Deno Etienne, DO 08/09/19 1728

## 2019-08-09 NOTE — H&P (Signed)
History and Physical  [name] [id number] DOB: 1934-05-26 DOA: 08/09/2019   Patient coming from: Home & is able to ambulate  Chief Complaint: Slurred speech  HPI: [name] is a 85 y.o. male with medical history significant for massive PE on chronic anticoagulation, seizures, diabetes mellitus type 2, hyperlipidemia, hypertension, prostate CA, presents to the ED complaining of slurred speech, aphasia.  Patient was last seen normal on 08/08/2019 by his brother who lives with him.  On 08/09/2019, patient attempted to talk to his daughter, was noted to have slurred speech/aphasia.  Patient has a history of seizure, but denied any seizure-like activity and reports he does typically have a postictal phase which have not occurred.  Patient reports compliance to his blood thinners, cholesterol medication as well as aspirin.  Patient was seen 06/2019 by outpatient neurologist who ordered a CT angiogram of the head and neck which showed no significant large vessel disease.  Patient currently denies any weakness, numbness, vision changes, headache, chest pain, shortness of breath, abdominal pain, nausea/vomiting, fever/chills.  EDP consulted Dr. Leonel Ramsay, neurology who recommended getting an MRI brain.  ED Course: In the ED, patient was noted to be in hypertensive crisis, with BP 189/110, saturating well on room air, afebrile, no leukocytosis.  Labs fairly unremarkable, COVID-19 test pending, UA unremarkable, UDS negative, CT head unremarkable for any acute intracranial abnormality, MRI brain showed small acute infarct in the posterior left frontal lobe. Neurology Dr. Leonel Ramsay was made aware, recommended admission for further management.     Review of Systems: Review of systems are otherwise negative   Past Medical History:  Diagnosis Date  . Anemia   . Cancer [facility])    Prostate  . Cervicalgia   . Essential hypertension, benign   . History of loop recorder 07/14/2014  . Impotence of organic  origin   . Insomnia   . Pulmonary embolism ([place])   . Pure hypercholesterolemia   . Saddle pulmonary embolus ([place])   . Seizures ([place])   . Type II or unspecified type diabetes mellitus without mention of complication, not stated as uncontrolled    Past Surgical History:  Procedure Laterality Date  . LOOP RECORDER IMPLANT N/A 03/26/2014   Procedure: LOOP RECORDER IMPLANT;  Surgeon: Sanda Klein, MD;  Location: [place] CATH LAB;  Service: Cardiovascular;  Laterality: N/A;    Social History:  reports that he has quit smoking. He has never used smokeless tobacco. He reports that he does not drink alcohol or use drugs.   No Known Allergies  Family History  Problem Relation Age of Onset  . Cancer Mother   . Cancer Father       Prior to Admission medications   Medication Sig Start Date End Date Taking? Authorizing Provider  aspirin EC 81 MG tablet Take 1 tablet (81 mg total) by mouth daily. 06/26/19  Yes Marcial Pacas, MD  edoxaban (SAVAYSA) 60 MG TABS tablet Take 60 mg by mouth daily. 06/24/19  Yes Hosie Poisson, MD  ferrous sulfate 325 (65 FE) MG EC tablet Take 1 tablet (325 mg total) by mouth 2 (two) times daily. 06/24/19  Yes Hosie Poisson, MD  levETIRAcetam (KEPPRA) 750 MG tablet Take 2 tablets (1,500 mg total) by mouth 2 (two) times daily. Patient taking differently: Take 1,500-2,250 mg by mouth See admin instructions. 1500 mg in am, 2250 mg in pm 04/11/19  Yes Garvin Fila, MD  lisinopril (ZESTRIL) 30 MG tablet Take 1 tablet (30 mg total) by mouth daily. 06/24/19  Yes  Hosie Poisson, MD  pravastatin (PRAVACHOL) 80 MG tablet Take 1 tablet (80 mg total) by mouth at bedtime. 06/24/19  Yes Hosie Poisson, MD    Physical Exam: BP (!) 174/99   Pulse 97   Temp 98.7 F (37.1 C)   Resp 15   Ht 6' (1.829 m)   Wt 79.8 kg   SpO2 98%   BMI 23.87 kg/m   General: NAD, slurred speech noted Eyes: Normal ENT: Normal Neck: Supple Cardiovascular: S1, S2 present Respiratory: CTA B Abdomen: Soft,  nontender, nondistended, bowel sounds present Skin: Normal Musculoskeletal: No bilateral pedal edema noted Psychiatric: Normal mood, pleasant Neurologic: Aphasia/slurred speech noted, strength normal and equal in all extremities, sensation intact, no other obvious focal neurologic deficits noted          Labs on Admission:  Basic Metabolic Panel: Recent Labs  Lab 08/09/19 1250  NA 140  K 4.2  CL 105  CO2 28  GLUCOSE 117*  BUN 9  CREATININE 0.99  CALCIUM 9.2   Liver Function Tests: Recent Labs  Lab 08/09/19 1250  AST 18  ALT 13  ALKPHOS 80  BILITOT 0.5  PROT 7.1  ALBUMIN 4.0   No results for input(s): LIPASE, AMYLASE in the last 168 hours. No results for input(s): AMMONIA in the last 168 hours. CBC: Recent Labs  Lab 08/09/19 1250  WBC 4.3  HGB 14.7  HCT 45.9  MCV 99.6  PLT 195   Cardiac Enzymes: No results for input(s): CKTOTAL, CKMB, CKMBINDEX, TROPONINI in the last 168 hours.  BNP (last 3 results) No results for input(s): BNP in the last 8760 hours.  ProBNP (last 3 results) No results for input(s): PROBNP in the last 8760 hours.  CBG: Recent Labs  Lab 08/09/19 1248  GLUCAP 95    Radiological Exams on Admission: CT HEAD WO CONTRAST  Result Date: 08/09/2019 CLINICAL DATA:  Aphasia EXAM: CT HEAD WITHOUT CONTRAST TECHNIQUE: Contiguous axial images were obtained from the base of the skull through the vertex without intravenous contrast. COMPARISON:  07/22/2019 FINDINGS: Brain: No evidence of acute infarction, hemorrhage, hydrocephalus, extra-axial collection or mass lesion/mass effect. Scattered low-density changes within the periventricular and subcortical white matter compatible with chronic microvascular ischemic change. Mild diffuse cerebral volume loss. Vascular: Atherosclerotic calcifications involving the large vessels of the skull base. No unexpected hyperdense vessel. Skull: Normal. Negative for fracture or focal lesion. Sinuses/Orbits: No acute  finding. Other: None. IMPRESSION: 1.  No acute intracranial findings. 2.  Chronic microvascular ischemic change and cerebral volume loss. Electronically Signed   By: Davina Poke D.O.   On: 08/09/2019 13:39   MR Brain Wo Contrast (neuro protocol)  Result Date: 08/09/2019 CLINICAL DATA:  Speech difficulty. EXAM: MRI HEAD WITHOUT CONTRAST TECHNIQUE: Multiplanar, multiecho pulse sequences of the brain and surrounding structures were obtained without intravenous contrast. COMPARISON:  Head CT 08/09/2019 and MRI 06/23/2019 FINDINGS: Brain: There is an approximately 2 cm acute cortical infarct in the left precentral gyrus lateral to the hand motor area. No intracranial hemorrhage, mass, midline shift, or extra-axial fluid collection is identified. Patchy T2 hyperintensities in the cerebral white matter bilaterally are similar to the prior MRI and are nonspecific but compatible with moderate chronic small vessel ischemic disease. Generalized cerebral atrophy is mild for age. Vascular: Major intracranial vascular flow voids are preserved. Skull and upper cervical spine: No suspicious marrow lesion. Sinuses/Orbits: Right cataract extraction. Paranasal sinuses and mastoid air cells are clear. Other: None. IMPRESSION: 1. Small acute infarct in the  posterior left frontal lobe. 2. Moderate chronic small vessel ischemic disease. Electronically Signed   By: Logan Bores M.D.   On: 08/09/2019 16:56    EKG: Independently reviewed.  Normal sinus rhythm, no acute ST changes  Assessment/Plan Present on Admission: . Acute CVA (cerebrovascular accident) ([place]) . Hx pulmonary embolism . HTN (hypertension) . Dyslipidemia . Cerebrovascular accident (CVA) [facility])  Principal Problem:   Acute CVA (cerebrovascular accident) ([place]) Active Problems:   Seizures ([place])   Hx pulmonary embolism   HTN (hypertension)   Dyslipidemia   Cerebrovascular accident (CVA) ([place])   Small acute infarct in the posterior left frontal  lobe Patient presents with slurred speech/aphasia CT head unremarkable for any acute intracranial abnormality MRI brain showed small acute infarct in the posterior left frontal lobe UDS negative Neurology Dr. Leonel Ramsay consulted, appreciate recommendations A1c, lipid panel pending Carotid Doppler, echo pending N.p.o. for now pending speech evaluation, if passes swallow screen, may resume aspirin, statin, await for further recommendations from neurology PT/OT, fall precautions Telemetry  Hypertensive crisis BP 189/110, on presentation, currently improving Allow for permissive hypertension for now Hold home lisinopril for now Monitor closely  History of PE on chronic anticoagulation Patient on edoxaban, reports compliance Since n.p.o., will switch to IV Heparin pending speech evaluation and further recommendation from neurology  History of seizures Keppra level pending Switch Keppra to IV 1500 twice daily for now Seizure precautions       DVT prophylaxis: IV heparin for now  Code Status: Full  Family Communication: Discussed with his brother on 08/09/2019  Disposition Plan: Likely home in the next 24 hours  Consults called: Neurology  Admission status: Observation, telemetry    Alma Friendly MD [facility]   If 7PM-7AM, please contact night-coverage [email or web address]   08/09/2019, 6:09 PM

## 2019-08-09 NOTE — ED Triage Notes (Signed)
Per family pt has a new onset of slurred speech, LKW was yesterday, unsure of time, they all live together but family cant give an idea of LKW.  Py unable to communicate with family, answered all questions appropriate for EMS. Pt with a hx of seizures    BP 209/121 HR 75 CBG 128  18g LAC

## 2019-08-09 NOTE — ED Provider Notes (Signed)
[place] EMERGENCY DEPARTMENT Provider Note   CSN: [id number] Arrival date & time: 08/09/19  1232     History Chief Complaint  Patient presents with  . Aphasia  . Altered Mental Status    [name] is a 85 y.o. male.  85yo M w/ PMH including PE, seizures, T2DM, HTN who p/w speech problems. Pt was last seen normal sometime yesterday by his family who lives with him. Today, he tried to talk to his daughter and they noticed he was having speech problems. He has not had any associated seizure like activity and normally with seizures he has typical post-ictal phase. He denies any weakness, numbness, vision changes, or recent illness. States that in the past he had a stroke in his L eye.   The history is provided by the patient.  Altered Mental Status      Past Medical History:  Diagnosis Date  . Anemia   . Cancer [facility])    Prostate  . Cervicalgia   . Essential hypertension, benign   . History of loop recorder 07/14/2014  . Impotence of organic origin   . Insomnia   . Pulmonary embolism ([place])   . Pure hypercholesterolemia   . Saddle pulmonary embolus ([place])   . Seizures ([place])   . Type II or unspecified type diabetes mellitus without mention of complication, not stated as uncontrolled     Patient Active Problem List   Diagnosis Date Noted  . Gait abnormality 06/26/2019  . Cerebrovascular accident (CVA) ([place]) 06/26/2019  . Generalized idiopathic epilepsy and epileptic syndromes, not intractable, with status epilepticus ([place]) 06/26/2019  . Acute encephalopathy 05/17/2015  . Chronic anticoagulation 03/26/2014  . Dyslipidemia 03/24/2014  . Hx pulmonary embolism 10/25/2013  . HTN (hypertension) 10/25/2013  . Seizures ([place]) 09/10/2012    Past Surgical History:  Procedure Laterality Date  . LOOP RECORDER IMPLANT N/A 03/26/2014   Procedure: LOOP RECORDER IMPLANT;  Surgeon: Sanda Klein, MD;  Location: [place] CATH LAB;  Service: Cardiovascular;  Laterality:  N/A;       Family History  Problem Relation Age of Onset  . Cancer Mother   . Cancer Father     Social History   Tobacco Use  . Smoking status: Former Research scientist (life sciences)  . Smokeless tobacco: Never Used  Substance Use Topics  . Alcohol use: No  . Drug use: No    Home Medications Prior to Admission medications   Medication Sig Start Date End Date Taking? Authorizing Provider  aspirin EC 81 MG tablet Take 1 tablet (81 mg total) by mouth daily. 06/26/19  Yes Marcial Pacas, MD  edoxaban (SAVAYSA) 60 MG TABS tablet Take 60 mg by mouth daily. 06/24/19  Yes Hosie Poisson, MD  ferrous sulfate 325 (65 FE) MG EC tablet Take 1 tablet (325 mg total) by mouth 2 (two) times daily. 06/24/19  Yes Hosie Poisson, MD  levETIRAcetam (KEPPRA) 750 MG tablet Take 2 tablets (1,500 mg total) by mouth 2 (two) times daily. Patient taking differently: Take 1,500-2,250 mg by mouth See admin instructions. 1500 mg in am, 2250 mg in pm 04/11/19  Yes Garvin Fila, MD  lisinopril (ZESTRIL) 30 MG tablet Take 1 tablet (30 mg total) by mouth daily. 06/24/19  Yes Hosie Poisson, MD  pravastatin (PRAVACHOL) 80 MG tablet Take 1 tablet (80 mg total) by mouth at bedtime. 06/24/19  Yes Hosie Poisson, MD    Allergies    Patient has no known allergies.  Review of Systems   Review of  Systems All other systems reviewed and are negative except that which was mentioned in HPI  Physical Exam Updated Vital Signs BP (!) 185/102   Pulse 73   Temp 98.7 F (37.1 C)   Resp 19   Ht 6' (1.829 m)   Wt 79.8 kg   SpO2 100%   BMI 23.87 kg/m   Physical Exam Vitals and nursing note reviewed.  Constitutional:      General: He is not in acute distress.    Appearance: He is well-developed.     Comments: Awake, alert  HENT:     Head: Normocephalic and atraumatic.  Eyes:     Extraocular Movements: Extraocular movements intact.     Conjunctiva/sclera: Conjunctivae normal.     Pupils: Pupils are equal, round, and reactive to light.    Cardiovascular:     Rate and Rhythm: Normal rate and regular rhythm.     Heart sounds: Normal heart sounds. No murmur.  Pulmonary:     Effort: Pulmonary effort is normal. No respiratory distress.     Breath sounds: Normal breath sounds.  Abdominal:     General: Bowel sounds are normal. There is no distension.     Palpations: Abdomen is soft.     Tenderness: There is no abdominal tenderness.  Musculoskeletal:     Cervical back: Neck supple.  Skin:    General: Skin is warm and dry.  Neurological:     Mental Status: He is alert and oriented to person, place, and time.     Cranial Nerves: No cranial nerve deficit.     Motor: No abnormal muscle tone.     Deep Tendon Reflexes: Reflexes are normal and symmetric.     Comments: Difficulty articulating some words and some word finding difficulty, normal finger-to-nose testing, negative pronator drift, no clonus 5/5 strength and normal sensation x all 4 extremities  Psychiatric:        Thought Content: Thought content normal.        Judgment: Judgment normal.     ED Results / Procedures / Treatments   Labs (all labs ordered are listed, but only abnormal results are displayed) Labs Reviewed  COMPREHENSIVE METABOLIC PANEL - Abnormal; Notable for the following components:      Result Value   Glucose, Bld 117 (*)    All other components within normal limits  CBC  PROTIME-INR  ETHANOL  RAPID URINE DRUG SCREEN, HOSP PERFORMED  URINALYSIS, ROUTINE W REFLEX MICROSCOPIC  LEVETIRACETAM LEVEL  AMMONIA  CBG MONITORING, ED    EKG None  Radiology CT HEAD WO CONTRAST  Result Date: 08/09/2019 CLINICAL DATA:  Aphasia EXAM: CT HEAD WITHOUT CONTRAST TECHNIQUE: Contiguous axial images were obtained from the base of the skull through the vertex without intravenous contrast. COMPARISON:  07/22/2019 FINDINGS: Brain: No evidence of acute infarction, hemorrhage, hydrocephalus, extra-axial collection or mass lesion/mass effect. Scattered low-density  changes within the periventricular and subcortical white matter compatible with chronic microvascular ischemic change. Mild diffuse cerebral volume loss. Vascular: Atherosclerotic calcifications involving the large vessels of the skull base. No unexpected hyperdense vessel. Skull: Normal. Negative for fracture or focal lesion. Sinuses/Orbits: No acute finding. Other: None. IMPRESSION: 1.  No acute intracranial findings. 2.  Chronic microvascular ischemic change and cerebral volume loss. Electronically Signed   By: Davina Poke D.O.   On: 08/09/2019 13:39    Procedures Procedures (including critical care time)  Medications Ordered in ED Medications  sodium chloride flush (NS) 0.9 % injection 3 mL (3  mLs Intravenous Given 08/09/19 1252)    ED Course  I have reviewed the triage vital signs and the nursing notes.  Pertinent labs & imaging results that were available during my care of the patient were reviewed by me and considered in my medical decision making (see chart for details).    MDM Rules/Calculators/A&P                      Pt alert, hypertensive on arrival, some speech difficulty and word finding difficulty, no other focal neuro deficits. No seizure-like activity. Reports compliance w/ medications.  Last seen normal sometime yesterday therefore did not activate code stroke as he would not be a TPA candidate.  Screening lab work unremarkable.  CT head negative for acute process such as hemorrhage.  I discussed with neurologist Dr. Leonel Ramsay who recommended ordering Keppra level which is [place] neurologist can follow-up in the clinic as well as ordering MRI of brain to evaluate for acute stroke.  We feel that seizure is very unlikely explanation for his symptoms.  I am writing the patient out to the oncoming provider who will follow up on MRI.  If MRI negative, I anticipate the patient will be eligible for discharge if he remains stable. Final Clinical Impression(s) / ED  Diagnoses Final diagnoses:  None    Rx / DC Orders ED Discharge Orders    None       [place] Perra, Wenda Overland, MD 08/09/19 [phone]

## 2019-08-09 NOTE — Plan of Care (Signed)
  Problem: Education: Goal: Knowledge of disease or condition will improve Outcome: Progressing Goal: Knowledge of secondary prevention will improve Outcome: Progressing Goal: Knowledge of patient specific risk factors addressed and post discharge goals established will improve Outcome: Progressing   Problem: Coping: Goal: Will verbalize positive feelings about self Outcome: Progressing   Problem: Self-Care: Goal: Verbalization of feelings and concerns over difficulty with self-care will improve Outcome: Progressing Goal: Ability to communicate needs accurately will improve Outcome: Progressing   Problem: Ischemic Stroke/TIA Tissue Perfusion: Goal: Complications of ischemic stroke/TIA will be minimized Outcome: Progressing

## 2019-08-10 ENCOUNTER — Observation Stay (HOSPITAL_COMMUNITY): Payer: Medicare Other

## 2019-08-10 DIAGNOSIS — E119 Type 2 diabetes mellitus without complications: Secondary | ICD-10-CM | POA: Diagnosis present

## 2019-08-10 DIAGNOSIS — I6389 Other cerebral infarction: Secondary | ICD-10-CM | POA: Diagnosis not present

## 2019-08-10 DIAGNOSIS — R262 Difficulty in walking, not elsewhere classified: Secondary | ICD-10-CM | POA: Diagnosis present

## 2019-08-10 DIAGNOSIS — Z86711 Personal history of pulmonary embolism: Secondary | ICD-10-CM | POA: Diagnosis not present

## 2019-08-10 DIAGNOSIS — Z8546 Personal history of malignant neoplasm of prostate: Secondary | ICD-10-CM | POA: Diagnosis not present

## 2019-08-10 DIAGNOSIS — R569 Unspecified convulsions: Secondary | ICD-10-CM | POA: Diagnosis not present

## 2019-08-10 DIAGNOSIS — H4921 Sixth [abducent] nerve palsy, right eye: Secondary | ICD-10-CM | POA: Diagnosis present

## 2019-08-10 DIAGNOSIS — D509 Iron deficiency anemia, unspecified: Secondary | ICD-10-CM | POA: Diagnosis present

## 2019-08-10 DIAGNOSIS — I6340 Cerebral infarction due to embolism of unspecified cerebral artery: Secondary | ICD-10-CM | POA: Diagnosis present

## 2019-08-10 DIAGNOSIS — E785 Hyperlipidemia, unspecified: Secondary | ICD-10-CM | POA: Diagnosis present

## 2019-08-10 DIAGNOSIS — R4182 Altered mental status, unspecified: Secondary | ICD-10-CM | POA: Diagnosis present

## 2019-08-10 DIAGNOSIS — I169 Hypertensive crisis, unspecified: Secondary | ICD-10-CM | POA: Diagnosis present

## 2019-08-10 DIAGNOSIS — Z79899 Other long term (current) drug therapy: Secondary | ICD-10-CM | POA: Diagnosis not present

## 2019-08-10 DIAGNOSIS — Z20822 Contact with and (suspected) exposure to covid-19: Secondary | ICD-10-CM | POA: Diagnosis present

## 2019-08-10 DIAGNOSIS — I639 Cerebral infarction, unspecified: Secondary | ICD-10-CM | POA: Diagnosis not present

## 2019-08-10 DIAGNOSIS — Z7902 Long term (current) use of antithrombotics/antiplatelets: Secondary | ICD-10-CM | POA: Diagnosis not present

## 2019-08-10 DIAGNOSIS — I10 Essential (primary) hypertension: Secondary | ICD-10-CM | POA: Diagnosis present

## 2019-08-10 DIAGNOSIS — Z809 Family history of malignant neoplasm, unspecified: Secondary | ICD-10-CM | POA: Diagnosis not present

## 2019-08-10 DIAGNOSIS — G40909 Epilepsy, unspecified, not intractable, without status epilepticus: Secondary | ICD-10-CM | POA: Diagnosis present

## 2019-08-10 DIAGNOSIS — R471 Dysarthria and anarthria: Secondary | ICD-10-CM | POA: Diagnosis present

## 2019-08-10 DIAGNOSIS — I69398 Other sequelae of cerebral infarction: Secondary | ICD-10-CM | POA: Diagnosis not present

## 2019-08-10 DIAGNOSIS — Z87891 Personal history of nicotine dependence: Secondary | ICD-10-CM | POA: Diagnosis not present

## 2019-08-10 DIAGNOSIS — R4701 Aphasia: Secondary | ICD-10-CM | POA: Diagnosis present

## 2019-08-10 DIAGNOSIS — Z7982 Long term (current) use of aspirin: Secondary | ICD-10-CM | POA: Diagnosis not present

## 2019-08-10 DIAGNOSIS — Z7901 Long term (current) use of anticoagulants: Secondary | ICD-10-CM | POA: Diagnosis not present

## 2019-08-10 LAB — CBC WITH DIFFERENTIAL/PLATELET
Abs Immature Granulocytes: 0 10*3/uL (ref 0.00–0.07)
Basophils Absolute: 0.1 10*3/uL (ref 0.0–0.1)
Basophils Relative: 1 %
Eosinophils Absolute: 0.1 10*3/uL (ref 0.0–0.5)
Eosinophils Relative: 2 %
HCT: 42 % (ref 39.0–52.0)
Hemoglobin: 14.1 g/dL (ref 13.0–17.0)
Immature Granulocytes: 0 %
Lymphocytes Relative: 38 %
Lymphs Abs: 1.5 10*3/uL (ref 0.7–4.0)
MCH: 33 pg (ref 26.0–34.0)
MCHC: 33.6 g/dL (ref 30.0–36.0)
MCV: 98.4 fL (ref 80.0–100.0)
Monocytes Absolute: 0.5 10*3/uL (ref 0.1–1.0)
Monocytes Relative: 13 %
Neutro Abs: 1.9 10*3/uL (ref 1.7–7.7)
Neutrophils Relative %: 46 %
Platelets: 194 10*3/uL (ref 150–400)
RBC: 4.27 MIL/uL (ref 4.22–5.81)
RDW: 12.3 % (ref 11.5–15.5)
WBC: 4 10*3/uL (ref 4.0–10.5)
nRBC: 0 % (ref 0.0–0.2)

## 2019-08-10 LAB — ECHOCARDIOGRAM COMPLETE
Height: 72 in
Weight: 2816 oz

## 2019-08-10 LAB — BASIC METABOLIC PANEL
Anion gap: 8 (ref 5–15)
BUN: 8 mg/dL (ref 8–23)
CO2: 27 mmol/L (ref 22–32)
Calcium: 9.1 mg/dL (ref 8.9–10.3)
Chloride: 106 mmol/L (ref 98–111)
Creatinine, Ser: 0.85 mg/dL (ref 0.61–1.24)
GFR calc Af Amer: >60 mL/min (ref >60)
GFR calc non Af Amer: >60 mL/min (ref >60)
Glucose, Bld: 104 mg/dL — ABNORMAL HIGH (ref 70–99)
Potassium: 3.9 mmol/L (ref 3.5–5.1)
Sodium: 141 mmol/L (ref 135–145)

## 2019-08-10 LAB — LIPID PANEL
Cholesterol: 152 mg/dL (ref 0–200)
HDL: 59 mg/dL (ref >40)
LDL Cholesterol: 84 mg/dL (ref 0–99)
Total CHOL/HDL Ratio: 2.6 RATIO
Triglycerides: 43 mg/dL (ref <150)
VLDL: 9 mg/dL (ref 0–40)

## 2019-08-10 LAB — APTT
aPTT: 99 seconds — ABNORMAL HIGH (ref 24–36)
aPTT: 104 seconds — ABNORMAL HIGH (ref 24–36)

## 2019-08-10 LAB — HEMOGLOBIN A1C
Hgb A1c MFr Bld: 6 % — ABNORMAL HIGH (ref 4.8–5.6)
Mean Plasma Glucose: 125.5 mg/dL

## 2019-08-10 LAB — HEPARIN LEVEL (UNFRACTIONATED)
Heparin Unfractionated: 0.57 IU/mL (ref 0.30–0.70)
Heparin Unfractionated: 0.79 IU/mL — ABNORMAL HIGH (ref 0.30–0.70)

## 2019-08-10 MED ORDER — EDOXABAN TOSYLATE 30 MG PO TABS
60.0000 mg | ORAL_TABLET | Freq: Every day | ORAL | Status: DC
  Administered 2019-08-10 – 2019-08-11 (×2): 60 mg via ORAL
  Filled 2019-08-10: qty 2
  Filled 2019-08-10: qty 60
  Filled 2019-08-10: qty 2

## 2019-08-10 MED ORDER — ASPIRIN EC 81 MG PO TBEC
81.0000 mg | DELAYED_RELEASE_TABLET | Freq: Every day | ORAL | Status: DC
  Administered 2019-08-10 – 2019-08-11 (×2): 81 mg via ORAL
  Filled 2019-08-10 (×2): qty 1

## 2019-08-10 MED ORDER — HEPARIN (PORCINE) 25000 UT/250ML-% IV SOLN: 950.0000 [IU]/h | INTRAVENOUS | Status: DC

## 2019-08-10 MED ORDER — FERROUS SULFATE 325 (65 FE) MG PO TABS
325.0000 mg | ORAL_TABLET | Freq: Two times a day (BID) | ORAL | Status: DC
  Administered 2019-08-10 – 2019-08-11 (×2): 325 mg via ORAL
  Filled 2019-08-10 (×3): qty 1

## 2019-08-10 MED ORDER — LEVETIRACETAM 750 MG PO TABS
1500.0000 mg | ORAL_TABLET | Freq: Two times a day (BID) | ORAL | Status: DC
  Administered 2019-08-10 (×2): 1500 mg via ORAL
  Filled 2019-08-10 (×2): qty 2

## 2019-08-10 MED ORDER — LABETALOL HCL 5 MG/ML IV SOLN: 10.0000 mg | INTRAVENOUS | Status: DC | PRN

## 2019-08-10 MED ORDER — ATORVASTATIN CALCIUM 80 MG PO TABS
80.0000 mg | ORAL_TABLET | Freq: Every day | ORAL | Status: DC
  Administered 2019-08-10 – 2019-08-11 (×2): 80 mg via ORAL
  Filled 2019-08-10 (×2): qty 1

## 2019-08-10 NOTE — Evaluation (Signed)
Speech Language Pathology Evaluation Patient Details Name: [name] MRN: [id number] DOB: [birth year] Today's Date: 08/10/2019 Time: 0740-0800 SLP Time Calculation (min) (ACUTE ONLY): 20 min  Problem List:  Patient Active Problem List   Diagnosis Date Noted  . Acute CVA (cerebrovascular accident) ([place]) 08/09/2019  . Gait abnormality 06/26/2019  . Cerebrovascular accident (CVA) ([place]) 06/26/2019  . Generalized idiopathic epilepsy and epileptic syndromes, not intractable, with status epilepticus ([place]) 06/26/2019  . Acute encephalopathy 05/17/2015  . Chronic anticoagulation 03/26/2014  . Dyslipidemia 03/24/2014  . Hx pulmonary embolism 10/25/2013  . HTN (hypertension) 10/25/2013  . Seizures ([place]) 09/10/2012   Past Medical History:  Past Medical History:  Diagnosis Date  . Anemia   . Cancer [facility])    Prostate  . Cervicalgia   . Essential hypertension, benign   . History of loop recorder 07/14/2014  . Impotence of organic origin   . Insomnia   . Pulmonary embolism ([place])   . Pure hypercholesterolemia   . Saddle pulmonary embolus ([place])   . Seizures ([place])   . Type II or unspecified type diabetes mellitus without mention of complication, not stated as uncontrolled    Past Surgical History:  Past Surgical History:  Procedure Laterality Date  . LOOP RECORDER IMPLANT N/A 03/26/2014   Procedure: LOOP RECORDER IMPLANT;  Surgeon: Sanda Klein, MD;  Location: [place] CATH LAB;  Service: Cardiovascular;  Laterality: N/A;   HPI:  [name] is a 85 y.o. male with medical history significant for massive PE on chronic anticoagulation, seizures, diabetes mellitus type 2, hyperlipidemia, hypertension, prostate CA, presents to the ED complaining of slurred speech and  aphasia.  Patient was last seen normal on 08/08/2019 by his brother who lives with him.  On 08/09/2019, patient attempted to talk to his daughter, was noted to have slurred speech/aphasia.  Patient has a history of seizure, but denied any  seizure-like activity and reports he does typically have a postictal phase which has not occurred.  Patient reports compliance to his blood thinners, cholesterol medication as well as aspirin.  Patient was seen 06/2019 by outpatient neurologist who ordered a CT angiogram of the head and neck which showed no significant large vessel disease.  Patient currently denies any weakness, numbness, vision changes, headache, chest pain, shortness of breath, abdominal pain, nausea/vomiting, fever/chills.  Chest xray was showing no acute findings.  MRI of the head was showing small acute infarct in the left posterior frontal lobe and moderate chronic small vessel disease.  He lives with his brother and was independent in medication management, bill paying, household chores.  He is still driving.     Assessment / Plan / Recommendation Clinical Impression  Language evaluation and motor speech screen were completed.  Cranial nerve exam was completed and unremarkable.  Lingual, labial, facial and jaw range of motion appeared adequate.  Facial sensation appeared to be intact and the patient did not endorse a difference in sensation from the left to right side of his face.  He reported he initially had slurred speech but that it was significantly improved since yesterday.  Today speech was clear and easy to understand.  No discernible dysarthria or apraxia was noted.  The [place] Test was completed and the patient achieved an overall score of 95/100 suggesting functional receptive and expressive language skills.  He was able to name pictures, perform automatic speech tasks, repeat words and phrases, answer yes/no questions, identify objects, follow 1-2 step directions, read/comprehend sentences, and write words/phrases to dictation.  He was slow to formulate thoughts on the word fluency portion of the exam and produced single words.  In conversation though speech was fluent and he was able to answer all  presented questions.  ST follow up is not recommended at this time.  He was encouraged to contact his MD if he noticed issues when he returned home.  If we can be of further assistance please feel free to re-consult.      SLP Assessment  SLP Recommendation/Assessment: Patient does not need any further Speech Lanaguage Pathology Services SLP Visit Diagnosis: Aphasia (R47.01)    Follow Up Recommendations  None          SLP Evaluation Cognition  Overall Cognitive Status: No family/caregiver present to determine baseline cognitive functioning Orientation Level: Oriented X4       Comprehension  Auditory Comprehension Overall Auditory Comprehension: Appears within functional limits for tasks assessed Yes/No Questions: Within Functional Limits Commands: Within Functional Limits Conversation: Complex Reading Comprehension Reading Status: Within funtional limits    Expression Expression Primary Mode of Expression: Verbal Verbal Expression Overall Verbal Expression: Appears within functional limits for tasks assessed Initiation: No impairment Automatic Speech: Name;Social Response;Counting;Day of week Level of Generative/Spontaneous Verbalization: Conversation Repetition: No impairment Naming: No impairment Pragmatics: No impairment Written Expression Dominant Hand: Right Written Expression: Within Functional Limits   Oral / Motor  Oral Motor/Sensory Function Overall Oral Motor/Sensory Function: Within functional limits Motor Speech Overall Motor Speech: Appears within functional limits for tasks assessed Respiration: Within functional limits Phonation: Normal Resonance: Within functional limits Articulation: Within functional limitis Intelligibility: Intelligible Motor Planning: Wit[place] functional limits Motor Speech Errors: Not applicable   GO                   Shelly Flatten, MA, CCC-SLP Acute Rehab SLP [phone]  Lamar Sprinkles 08/10/2019, 8:16 AM

## 2019-08-10 NOTE — Evaluation (Signed)
Clinical/Bedside Swallow Evaluation Patient Details  Name: [name] MRN: [id number] Date of Birth: 06-27-1934  Today's Date: 08/10/2019 Time: SLP Start Time (ACUTE ONLY): 69 SLP Stop Time (ACUTE ONLY): 0740 SLP Time Calculation (min) (ACUTE ONLY): 20 min  Past Medical History:  Past Medical History:  Diagnosis Date  . Anemia   . Cancer [facility])    Prostate  . Cervicalgia   . Essential hypertension, benign   . History of loop recorder 07/14/2014  . Impotence of organic origin   . Insomnia   . Pulmonary embolism ([place])   . Pure hypercholesterolemia   . [place]addle pulmonary embolus ([place])   . Seizures ([place])   . Type II or unspecified type diabetes mellitus without mention of complication, not stated as uncontrolled    Past Surgical History:  Past Surgical History:  Procedure Laterality Date  . LOOP RECORDER IMPLANT N/A 03/26/2014   Procedure: LOOP RECORDER IMPLANT;  Surgeon: Sanda Klein, MD;  Location: [place] CATH LAB;  Service: Cardiovascular;  Laterality: N/A;   HPI:  [name] is a 85 y.o. male with medical history significant for massive PE on chronic anticoagulation, seizures, diabetes mellitus type 2, hyperlipidemia, hypertension, prostate CA, presents to the ED complaining of slurred speech and  aphasia.  Patient was last seen normal on 08/08/2019 by his brother who lives with him.  On 08/09/2019, patient attempted to talk to his daughter, was noted to have slurred speech/aphasia.  Patient has a history of seizure, but denied any seizure-like activity and reports he does typically have a postictal phase which has not occurred.  Patient reports compliance to his blood thinners, cholesterol medication as well as aspirin.  Patient was seen 06/2019 by outpatient neurologist who ordered a CT angiogram of the head and neck which showed no significant large vessel disease.  Patient currently denies any weakness, numbness, vision changes, headache, chest pain, shortness of breath, abdominal pain,  nausea/vomiting, fever/chills.  Chest xray was showing no acute findings.  MRI of the head was showing small acute infarct in the left posterior frontal lobe and moderate chronic small vessel disease.  He lives with his brother and was independent in medication management, bill paying, household chores.  He is still driving.     Assessment / Plan / Recommendation Clinical Impression  Clinical swallowing evaluation was completed using thin liquids via spoon, cup and straw, pureed material and dry solids.  He was able to self feed throughout evaluation.  The patient did not endorse any issues swallowing.  He is completely edentulous in the upper arch and only has a few teeth on the lower arch.  However, he reported he does not have issues chewing food.  Cranial nerve exam was completed and unremarkable.  Lingual, labial, facial and jaw range of motion and strength were adeequate.  Facial sensation appeared to be intact and he did not endorse a difference in the sensation from the right to left side of his face.  His oral and pharyngeal swallow appeared to be functional.  Mastication of dry solids appeared adequate despite lack of dentition.  Swallow trigger was appreciated to palpation and overt s/s of aspiration were not seen.  In addition, the patient passed the [facility] Swallowing Protocol suggesting the low likelihood for aspiration.  Recommend a regular diet with thin liquids.  ST follow up is not indicated.   SLP Visit Diagnosis: Dysphagia, unspecified (R13.10)    Aspiration Risk  No limitations    Diet Recommendation   Regular with thin liquids  Medication Administration: Whole meds with liquid    Other  Recommendations Oral Care Recommendations: Oral care BID   Follow up Recommendations None        Swallow Study   General Date of Onset: 08/09/19 HPI: [name] is a 85 y.o. male with medical history significant for massive PE on chronic anticoagulation, seizures, diabetes mellitus type 2,  hyperlipidemia, hypertension, prostate CA, presents to the ED complaining of slurred speech and  aphasia.  Patient was last seen normal on 08/08/2019 by his brother who lives with him.  On 08/09/2019, patient attempted to talk to his daughter, was noted to have slurred speech/aphasia.  Patient has a history of seizure, but denied any seizure-like activity and reports he does typically have a postictal phase which has not occurred.  Patient reports compliance to his blood thinners, cholesterol medication as well as aspirin.  Patient was seen 06/2019 by outpatient neurologist who ordered a CT angiogram of the head and neck which showed no significant large vessel disease.  Patient currently denies any weakness, numbness, vision changes, headache, chest pain, shortness of breath, abdominal pain, nausea/vomiting, fever/chills.  Chest xray was showing no acute findings.  MRI of the head was showing small acute infarct in the left posterior frontal lobe and moderate chronic small vessel disease.  He lives with his brother and was independent in medication management, bill paying, household chores.  He is still driving.   Type of Study: Bedside Swallow Evaluation Previous Swallow Assessment: Never formally seen by ST at [facility]. Diet Prior to this Study: NPO Respiratory Status: Room air History of Recent Intubation: No Behavior/Cognition: Alert;Cooperative;Pleasant mood Oral Cavity Assessment: Within Functional Limits Oral Care Completed by SLP: No Oral Cavity - Dentition: Edentulous;Missing dentition Vision: Functional for self-feeding Self-Feeding Abilities: Able to feed self Patient Positioning: Upright in bed Baseline Vocal Quality: Normal Volitional Cough: (Not tested) Volitional Swallow: Able to elicit    Oral/Motor/Sensory Function Overall Oral Motor/Sensory Function: Within functional limits   Ice Chips Ice chips: Not tested   Thin Liquid Thin Liquid: Within functional limits Presentation: Cup;Self  Fed;Spoon;Straw    Nectar Thick Nectar Thick Liquid: Not tested   Honey Thick Honey Thick Liquid: Not tested   Puree Puree: Within functional limits Presentation: Spoon;Self Fed   Solid     Solid: Within functional limits Presentation: [place], [place], [place] [facility] SLP [phone]  Lamar Sprinkles 08/10/2019,8:07 AM

## 2019-08-10 NOTE — Progress Notes (Signed)
  Echocardiogram 2D Echocardiogram has been performed.  [name] 08/10/2019, 11:02 AM

## 2019-08-10 NOTE — Progress Notes (Addendum)
PROGRESS NOTE  [name] [id number] DOB: December 24, 1934 DOA: 08/09/2019 PCP: Cyndi Bender, PA-C  HPI/Recap of past 24 hours:  [name] is a 85 y.o. male with medical history significant for PE on Edoxaban, seizure disorder, diabetes mellitus type 2, hyperlipidemia, hypertension, prostate CA, presents to the ED complaining of slurred speech, and difficulty getting words out.  LKW on 08/08/2019 PM by his brother who lives with him.  On 08/09/2019, patient attempted to talk to his daughter, was noted able to get the words out.  Patient reports compliance to his medications.  He was seen on 06/2019 by outpatient neurologist who ordered a CT angiogram of the head and neck which showed no large vessel occlusion.  In the ED,  CT head unremarkable for any acute intracranial abnormality, MRI brain showed small acute infarct in the posterior left frontal lobe.   EDP consulted Neuro/stroke team Dr. Leonel Ramsay.  TRH asked to admit for stroke work up.  08/10/19: Seen and examined.  His aphasia is improving, but has some mild delay in his response to questions.  No obvious sensory or motor deficits noted at this time.  Ongoing stroke work-up with ongoing permissive hypertension.    Assessment/Plan: Principal Problem:   Acute CVA (cerebrovascular accident) ([place]) Active Problems:   Seizures ([place])   Hx pulmonary embolism   HTN (hypertension)   Dyslipidemia   Cerebrovascular accident (CVA) ([place])  Acute left frontal posterior CVA with prior history of CVA Suspected embolic source with improvement of symptomatology Presented with slurred speech and aphasia Aphasia is improving, on heparin drip On high intensity statin, Lipitor 80 mg daily Edoxaban held, defer to neurology for oral anticoagulation of choice for secondary CVA prevention Patient was on aspirin, Edoxaban, and Pravachol with compliance prior to his stroke LDL 84, goal less than 70 A1c 6.0, goal less than 7.0, at goal. Personally reviewed MRI  brain which shows left frontal posterior acute CVA Sinus rhythm on twelve-lead EKG MRA brain without contrast pending 2D echo pending PT OT assessment pending Continue to monitor on telemetry Continue frequent neurochecks  Essential hypertension Ongoing permissive hypertension Okay BP< 220/120 Gradually normalize BP in the next 5 to 7 days. As needed IV antihypertensives with parameters. Continue to closely monitor vital signs  Hyperlipidemia LDL 84 Goal LDL less than 70 Continue high intensity statin  History of pulmonary embolism No acute issues Was on Edoxaban Defer to neurology to change oral anticoagulation  Seizure disorder On Keppra, resume oral Keppra Last Keppra level 54.6 on 06/22/2019 Continue seizure precautions  Iron deficiency anemia Hemoglobin is stable Resume iron supplement   DVT prophylaxis: IV heparin   Code Status: Full  Family Communication: None at bedside    Consults called: Neurology      Dispo: The patient is from: Home              Anticipated d/c is to: Home               Anticipated d/c date is: 08/11/2019 or when neurology signs off.              Patient currently ongoing permissive hypertension and ongoing stroke work-up.      Objective: Vitals:   08/09/19 2228 08/10/19 0028 08/10/19 0427 08/10/19 0843  BP: (!) 149/88 (!) 154/96 133/84 (!) 190/104  Pulse: 84 78 81 89  Resp: (!) 22 (!) 24 17 20   Temp:  98.5 F (36.9 C) 98.4 F (36.9 C) 98.1 F (36.7 C)  TempSrc:  Oral Oral Oral  SpO2: 97% 96% 95% 98%  Weight:      Height:        Intake/Output Summary (Last 24 hours) at 08/10/2019 1054 Last data filed at 08/10/2019 0618 Gross per 24 hour  Intake 331.36 ml  Output 600 ml  Net -268.64 ml   Filed Weights   08/09/19 1240 08/09/19 2042  Weight: 79.8 kg 79.8 kg    Exam:  . General: 85 y.o. year-old male well developed well nourished in no acute distress.  Alert and oriented x3.  Some mild delay in his  response to questions.   . Cardiovascular: Regular rate and rhythm with no rubs or gallops.  No thyromegaly or JVD noted.   Marland Kitchen Respiratory: Clear to auscultation with no wheezes or rales. Good inspiratory effort. . Abdomen: Soft nontender nondistended with normal bowel sounds x4 quadrants. . Musculoskeletal: No lower extremity edema. 2/4 pulses in all 4 extremities.  Equal strength in all 4 extremities. Marland Kitchen Psychiatry: Mood is appropriate for condition and setting   Data Reviewed: CBC: Recent Labs  Lab 08/09/19 1250 08/10/19 0443  WBC 4.3 4.0  NEUTROABS  --  1.9  HGB 14.7 14.1  HCT 45.9 42.0  MCV 99.6 98.4  PLT 195 [id number]   Basic Metabolic Panel: Recent Labs  Lab 08/09/19 1250 08/10/19 0443  NA 140 141  K 4.2 3.9  CL 105 106  CO2 28 27  GLUCOSE 117* 104*  BUN 9 8  CREATININE 0.99 0.85  CALCIUM 9.2 9.1   GFR: Estimated Creatinine Clearance: 73.5 mL/min (by C-G formula based on SCr of 0.85 mg/dL). Liver Function Tests: Recent Labs  Lab 08/09/19 1250  AST 18  ALT 13  ALKPHOS 80  BILITOT 0.5  PROT 7.1  ALBUMIN 4.0   No results for input(s): LIPASE, AMYLASE in the last 168 hours. Recent Labs  Lab 08/09/19 2119  AMMONIA 25   Coagulation Profile: Recent Labs  Lab 08/09/19 1250  INR 1.0   Cardiac Enzymes: No results for input(s): CKTOTAL, CKMB, CKMBINDEX, TROPONINI in the last 168 hours. BNP (last 3 results) No results for input(s): PROBNP in the last 8760 hours. HbA1C: Recent Labs    08/10/19 0443  HGBA1C 6.0*   CBG: Recent Labs  Lab 08/09/19 1248  GLUCAP 95   Lipid Profile: Recent Labs    08/10/19 0443  CHOL 152  HDL 59  LDLCALC 84  TRIG 43  CHOLHDL 2.6   Thyroid Function Tests: No results for input(s): TSH, T4TOTAL, FREET4, T3FREE, THYROIDAB in the last 72 hours. Anemia Panel: No results for input(s): VITAMINB12, FOLATE, FERRITIN, TIBC, IRON, RETICCTPCT in the last 72 hours. Urine analysis:    Component Value Date/Time   COLORURINE  YELLOW 08/09/2019 [place] 08/09/2019 1716   LABSPEC 1.017 08/09/2019 1716   PHURINE 7.0 08/09/2019 1716   GLUCOSEU NEGATIVE 08/09/2019 1716   HGBUR NEGATIVE 08/09/2019 [place] 08/09/2019 [place] 08/09/2019 1716   PROTEINUR NEGATIVE 08/09/2019 1716   UROBILINOGEN 0.2 03/24/2014 1426   NITRITE NEGATIVE 08/09/2019 [place] 08/09/2019 1716   Sepsis Labs: @LABRCNTIP (procalcitonin:4,lacticidven:4)  ) Recent Results (from the past 240 hour(s))  SARS Coronavirus 2 by RT PCR (hospital order, performed in [facility] hospital lab) Nasopharyngeal Nasopharyngeal Swab     Status: None   Collection Time: 08/09/19  5:15 PM   Specimen: Nasopharyngeal Swab  Result Value Ref Range Status   SARS Coronavirus 2 NEGATIVE  NEGATIVE Final    Comment: (NOTE) SARS-CoV-2 target nucleic acids are NOT DETECTED. The SARS-CoV-2 RNA is generally detectable in upper and lower respiratory specimens during the acute phase of infection. The lowest concentration of SARS-CoV-2 viral copies this assay can detect is 250 copies / mL. A negative result does not preclude SARS-CoV-2 infection and should not be used as the sole basis for treatment or other patient management decisions.  A negative result may occur with improper specimen collection / handling, submission of specimen other than nasopharyngeal swab, presence of viral mutation(s) within the areas targeted by this assay, and inadequate number of viral copies (<250 copies / mL). A negative result must be combined with clinical observations, patient history, and epidemiological information. Fact Sheet for Patients:   StrictlyIdeas.no Fact Sheet for Healthcare Providers: BankingDealers.co.za This test is not yet approved or cleared  by the [place] FDA and has been authorized for detection and/or diagnosis of SARS-CoV-2 by FDA under an  Emergency Use Authorization (EUA).  This EUA will remain in effect (meaning this test can be used) for the duration of the COVID-19 declaration under Section 564(b)(1) of the Act, 21 U.S.C. section 360bbb-3(b)(1), unless the authorization is terminated or revoked sooner. Performed at [facility], [place] [address], [place], [place] [address]       Studies: DG Chest 2 View  Result Date: 08/09/2019 CLINICAL DATA:  CVA today. EXAM: CHEST - 2 VIEW COMPARISON:  Radiograph and chest CT 03/07/2019 FINDINGS: Implanted loop recorder in the left chest wall. The heart is normal in size. Unchanged mediastinal contours. No pulmonary edema, focal airspace disease, pleural effusion or pneumothorax. Eventration of right hemidiaphragm. No acute osseous abnormalities. IMPRESSION: No acute chest findings. Electronically Signed   By: Keith Rake M.D.   On: 08/09/2019 21:20   CT HEAD WO CONTRAST  Result Date: 08/09/2019 CLINICAL DATA:  Aphasia EXAM: CT HEAD WITHOUT CONTRAST TECHNIQUE: Contiguous axial images were obtained from the base of the skull through the vertex without intravenous contrast. COMPARISON:  07/22/2019 FINDINGS: Brain: No evidence of acute infarction, hemorrhage, hydrocephalus, extra-axial collection or mass lesion/mass effect. Scattered low-density changes within the periventricular and subcortical white matter compatible with chronic microvascular ischemic change. Mild diffuse cerebral volume loss. Vascular: Atherosclerotic calcifications involving the large vessels of the skull base. No unexpected hyperdense vessel. Skull: Normal. Negative for fracture or focal lesion. Sinuses/Orbits: No acute finding. Other: None. IMPRESSION: 1.  No acute intracranial findings. 2.  Chronic microvascular ischemic change and cerebral volume loss. Electronically Signed   By: Davina Poke D.O.   On: 08/09/2019 13:39   MR ANGIO HEAD WO CONTRAST  Result Date: 08/10/2019 CLINICAL DATA:  Speech  disturbance. Follow-up acute infarction posterior frontal lobe. EXAM: MRA HEAD WITHOUT CONTRAST TECHNIQUE: Angiographic images of the Circle of Willis were obtained using MRA technique without intravenous contrast. COMPARISON:  MRI yesterday.  CT angiography 07/22/2019 FINDINGS: Both internal carotid arteries widely patent through the skull base and siphon regions. Upper cervical internal carotid on the right is tortuous but widely patent. Artifactual irregular appearance of the upper cervical ICA on the left, shown not to be present on the recent CT angiography. The anterior and middle cerebral vessels are normal without proximal stenosis, aneurysm or vascular malformation. More distal branch vessels do not show any evidence of large or medium vessel occlusion, correctable proximal stenosis or atherosclerotic irregularity. Both vertebral arteries are patent to the basilar with the left being dominant. No basilar stenosis. Superior cerebellar and  posterior cerebral arteries appear patent and unremarkable. IMPRESSION: Negative intracranial MR angiography. No change appreciated since the CT angiography of 07/22/2019. No stenosis, large or medium vessel occlusion or MR evidence of widespread intracranial atherosclerotic change. Electronically Signed   By: Nelson Chimes M.D.   On: 08/10/2019 10:04   MR Brain Wo Contrast (neuro protocol)  Result Date: 08/09/2019 CLINICAL DATA:  Speech difficulty. EXAM: MRI HEAD WITHOUT CONTRAST TECHNIQUE: Multiplanar, multiecho pulse sequences of the brain and surrounding structures were obtained without intravenous contrast. COMPARISON:  Head CT 08/09/2019 and MRI 06/23/2019 FINDINGS: Brain: There is an approximately 2 cm acute cortical infarct in the left precentral gyrus lateral to the hand motor area. No intracranial hemorrhage, mass, midline shift, or extra-axial fluid collection is identified. Patchy T2 hyperintensities in the cerebral white matter bilaterally are similar to the  prior MRI and are nonspecific but compatible with moderate chronic small vessel ischemic disease. Generalized cerebral atrophy is mild for age. Vascular: Major intracranial vascular flow voids are preserved. Skull and upper cervical spine: No suspicious marrow lesion. Sinuses/Orbits: Right cataract extraction. Paranasal sinuses and mastoid air cells are clear. Other: None. IMPRESSION: 1. Small acute infarct in the posterior left frontal lobe. 2. Moderate chronic small vessel ischemic disease. Electronically Signed   By: Logan Bores M.D.   On: 08/09/2019 16:56    Scheduled Meds: . atorvastatin  80 mg Oral Daily  . levETIRAcetam  1,500 mg Oral BID    Continuous Infusions: . heparin 1,100 Units/hr (08/10/19 [id number])     LOS: 0 days     Kayleen Memos, MD [facility] Pager [phone]  If 7PM-7AM, please contact night-coverage [email or web address] Password [facility] 08/10/2019, 10:54 AM

## 2019-08-10 NOTE — Progress Notes (Signed)
Occupational Therapy Evaluation Patient Details Name: [name] MRN: [id number] DOB: 1934-06-30 Today's Date: 08/10/2019    History of Present Illness This 85 y.o. male admitted for difficulties with speech.  MRI of brain showed acute Lt posterior frontal CVA.  PMH includes:  PE, sz disorder, DM, HTN, prostate CA, h/o CVA    Clinical Impression   Pt admitted with above. He demonstrates the below listed deficits and will benefit from continued OT to maximize safety and independence with BADLs.  Pt presents to OT with mild balance deficits that may be his baseline due to an old ankle/foot injury, and impaired cognition including deficits with memory, problem solving, safety awareness, and attention.  He is very impulsive.  Short Blessed Test was administered and he scored 14/28 which is indicative of cognitive impairment.  Review of old therapy notes indicate that he was impulsive, and had noted deficits with attention and problem solving so unsure of his baseline as no family present.  He currently is able to perform ADLs at supervision level.   He lives with his brother and reports he was fully independent PTA, including driving.  Based on current deficits, recommend Direct supervision with cooking, financial and medication management.  Will follow acutely.       Follow Up Recommendations  Outpatient OT;Supervision - Intermittent(direct supervision with money, financial management, cooking)    Equipment Recommendations  None recommended by OT    Recommendations for Other Services       Precautions / Restrictions Precautions Precautions: Fall      Mobility Bed Mobility Overal bed mobility: Modified Independent                Transfers Overall transfer level: Needs assistance Equipment used: None Transfers: Sit to/from [facility] Sit to Stand: Supervision Stand pivot transfers: Supervision       General transfer comment: supervision for safety as he is  impulsive     Balance Overall balance assessment: Needs assistance Sitting-balance support: No upper extremity supported Sitting balance-Leahy Scale: Normal     Standing balance support: During functional activity;No upper extremity supported Standing balance-Leahy Scale: Good Standing balance comment: Pt has an old ankle/foot injury, and is able to maintain static standing without assist.  He is able bend forward to access toes and adjust socks without LOB.                           ADL either performed or assessed with clinical judgement   ADL Overall ADL's : Needs assistance/impaired Eating/Feeding: Independent   Grooming: Wash/dry hands;Wash/dry face;Oral care;Supervision/safety;Standing   Upper Body Bathing: Set up;Sitting   Lower Body Bathing: Supervison/ safety;Sit to/from stand   Upper Body Dressing : Set up;Sitting   Lower Body Dressing: Supervision/safety;Sit to/from stand   Toilet Transfer: Supervision/safety;Ambulation;Comfort height toilet   Toileting- Clothing Manipulation and Hygiene: Supervision/safety;Sit to/from stand       Functional mobility during ADLs: Supervision/safety General ADL Comments: Pt very impulsive      Vision Baseline Vision/History: Wears glasses Wears Glasses: At all times Patient Visual Report: No change from baseline Vision Assessment?: Yes Eye Alignment: Within Functional Limits Ocular Range of Motion: Within Functional Limits Alignment/Gaze Preference: Within Defined Limits Tracking/Visual Pursuits: Able to track stimulus in all quads without difficulty Saccades: Decreased speed of saccadic movement Visual Fields: No apparent deficits Additional Comments: Pt does not have glasses with him so unable to accurately assess acuity      Perception  Perception Perception Tested?: Yes   Praxis Praxis Praxis tested?: Within functional limits    Pertinent Vitals/Pain Pain Assessment: No/denies pain     Hand  Dominance Right   Extremity/Trunk Assessment Upper Extremity Assessment Upper Extremity Assessment: Overall WFL for tasks assessed   Lower Extremity Assessment Lower Extremity Assessment: Defer to PT evaluation   Cervical / Trunk Assessment Cervical / Trunk Assessment: Normal   Communication Communication Communication: Expressive difficulties(mild deficits )   Cognition Arousal/Alertness: Awake/alert Behavior During Therapy: Impulsive;WFL for tasks assessed/performed Overall Cognitive Status: No family/caregiver present to determine baseline cognitive functioning Area of Impairment: Attention;Memory;Following commands;Safety/judgement;Awareness;Problem solving                   Current Attention Level: Sustained;Selective Memory: Decreased short-term memory Following Commands: Follows one step commands inconsistently;Follows multi-step commands consistently Safety/Judgement: Decreased awareness of safety Awareness: Emergent Problem Solving: Requires verbal cues General Comments: Pt impulsive, and demonstrates decreased carry over of info.  Short Blessed Test administered, and pt scored 14/28 which is consistent with cognitive impairment. He demonstrated difficulty with sequencing, attention, and memory.  Unsure of baseline functioning, as cognitive deficits are noted in previous admission notes, most notably impulsivity and problem solving and attention    General Comments  VSS at beginning of session with BP 139/103 and HR 89;  HR increased to 161 with simple grooming standing at sink and donning pants, BP 181/130.  RN notified who contacted MD.  Pt moved to seated position.  Mechanism of stroke and risk factors discussed with pt      Exercises     Shoulder Instructions      Home Living Family/patient expects to be discharged to:: Private residence Living Arrangements: Other relatives Available Help at Discharge: Family;Available 24 hours/day Type of Home: House Home  Access: Level entry     Home Layout: One level     Bathroom Shower/Tub: Occupational psychologist: Standard     Home Equipment: Shower seat;Hand held Tourist information centre manager - 2 wheels   Additional Comments: pt reports wearing glasses, does not have them with him      Prior Functioning/Environment Level of Independence: Independent        Comments: Pt grocery shops, drives, and is active in the community         OT Problem List: Impaired balance (sitting and/or standing);Decreased safety awareness;Cardiopulmonary status limiting activity;Decreased cognition      OT Treatment/Interventions: Self-care/ADL training;DME and/or AE instruction;Therapeutic activities;Cognitive remediation/compensation;Patient/family education;Balance training    OT Goals(Current goals can be found in the care plan section) Acute Rehab OT Goals Patient Stated Goal: to go home  OT Goal Formulation: With patient Time For Goal Achievement: 08/24/19 Potential to Achieve Goals: Good ADL Goals Pt Will Perform Grooming: with modified independence;standing Pt Will Perform Upper Body Bathing: with modified independence;standing Pt Will Perform Lower Body Bathing: with modified independence;sit to/from stand Pt Will Perform Upper Body Dressing: with modified independence;sitting;standing Pt Will Perform Lower Body Dressing: with modified independence;sit to/from stand Pt Will Transfer to Toilet: with modified independence;ambulating;regular height toilet;grab bars Pt Will Perform Toileting - Clothing Manipulation and hygiene: with modified independence;sit to/from stand Additional ADL Goal #1: Pt will verbalize understanding of need for supervision with financial and medication management Additional ADL Goal #2: Pt will perform path finding in unfamiliar environment with min cues  OT Frequency: Min 2X/week   Barriers to D/C:            Co-evaluation  AM-PAC OT "6 Clicks" Daily  Activity     Outcome Measure Help from another person eating meals?: None Help from another person taking care of personal grooming?: A Little Help from another person toileting, which includes using toliet, bedpan, or urinal?: A Little Help from another person bathing (including washing, rinsing, drying)?: A Little Help from another person to put on and taking off regular upper body clothing?: A Little Help from another person to put on and taking off regular lower body clothing?: A Little 6 Click Score: 19   End of Session Equipment Utilized During Treatment: Gait belt Nurse Communication: Mobility status  Activity Tolerance: Patient tolerated treatment well Patient left: in chair;with call bell/phone within reach;with chair alarm set  OT Visit Diagnosis: Unsteadiness on feet (R26.81);Cognitive communication deficit (R41.841) Symptoms and signs involving cognitive functions: Cerebral infarction                Time: 1140-1230 OT Time Calculation (min): 50 min Charges:  OT General Charges $OT Visit: 1 Visit OT Evaluation $OT Eval Moderate Complexity: 1 Mod OT Treatments $Self Care/Home Management : 23-37 mins  Nilsa Nutting., OTR/L Acute Rehabilitation Services Pager [phone] Office [place], [place] 08/10/2019, 2:18 PM

## 2019-08-10 NOTE — Progress Notes (Signed)
[facility] for heparin Indication: H/o PE  Assessment: 69 YOF on edoxaban for h/o PE here with new acute ischemic stroke on MRI. Pharmacy consulted to transition to IV heparin. Discussed case with neurology. H/H and Plt wnl.   Heparin level 0.57 units/ml, aPTT 99 sec  Goal of Therapy:  Heparin level 0.3-0.5 units/ml aPTT 66-85 seconds Monitor platelets by anticoagulation protocol: Yes   Plan:  -Decrease IV heparin to 1100 units/hr -F/u 8 hr HL, aPTT -Monitor daily HL, aPTT, CBC and s/s of bleeding   Thanks for allowing pharmacy to be a part of this patient's care.  [name], PharmD Clinical Pharmacist

## 2019-08-10 NOTE — Care Management (Signed)
Benefit check submitted for pradaxa and eliquis. Results will post Monday

## 2019-08-10 NOTE — Progress Notes (Signed)
STROKE TEAM PROGRESS NOTE   INTERVAL HISTORY His PT is at the bedside.  Pt sitting in chair. Stated that he feels good today, however, he still has occasional word finding difficulty. MRI showed small left frontal cortical infarct. He stated that he had "eye stroke" in March. According to the note seems that he had right CN VI palsy however, pt stated that he also had difficulty walking and imbalance.    OBJECTIVE Vitals:   08/09/19 2228 08/10/19 0028 08/10/19 0427 08/10/19 0843  BP: (!) 149/88 (!) 154/96 133/84 (!) 190/104  Pulse: 84 78 81 89  Resp: (!) 22 (!) 24 17 20   Temp:  98.5 F (36.9 C) 98.4 F (36.9 C) 98.1 F (36.7 C)  TempSrc:  Oral Oral Oral  SpO2: 97% 96% 95% 98%  Weight:      Height:        CBC:  Recent Labs  Lab 08/09/19 1250 08/10/19 0443  WBC 4.3 4.0  NEUTROABS  --  1.9  HGB 14.7 14.1  HCT 45.9 42.0  MCV 99.6 98.4  PLT 195 [id number]    Basic Metabolic Panel:  Recent Labs  Lab 08/09/19 1250 08/10/19 0443  NA 140 141  K 4.2 3.9  CL 105 106  CO2 28 27  GLUCOSE 117* 104*  BUN 9 8  CREATININE 0.99 0.85  CALCIUM 9.2 9.1    Lipid Panel:     Component Value Date/Time   CHOL 152 08/10/2019 0443   TRIG 43 08/10/2019 0443   HDL 59 08/10/2019 0443   CHOLHDL 2.6 08/10/2019 0443   VLDL 9 08/10/2019 0443   LDLCALC 84 08/10/2019 0443   HgbA1c:  Lab Results  Component Value Date   HGBA1C 6.0 (H) 08/10/2019   Urine Drug Screen:     Component Value Date/Time   LABOPIA NONE DETECTED 08/09/2019 1716   COCAINSCRNUR NONE DETECTED 08/09/2019 1716   LABBENZ NONE DETECTED 08/09/2019 1716   AMPHETMU NONE DETECTED 08/09/2019 1716   THCU NONE DETECTED 08/09/2019 1716   LABBARB NONE DETECTED 08/09/2019 1716    Alcohol Level     Component Value Date/Time   ETH <10 08/09/2019 2119    IMAGING  DG Chest 2 View 08/09/2019 IMPRESSION:  No acute chest findings.   CT HEAD WO CONTRAST 08/09/2019 IMPRESSION:  1.  No acute intracranial findings.  2.   Chronic microvascular ischemic change and cerebral volume loss.   MR Brain Wo Contrast (neuro protocol) 08/09/2019 IMPRESSION:  1. Small acute infarct in the posterior left frontal lobe.  2. Moderate chronic small vessel ischemic disease.  MRA Head 08/10/2019 Negative intracranial MR angiography. No change appreciated since the CT angiography of 07/22/2019. No stenosis, large or medium vessel occlusion or MR evidence of widespread intracranial atherosclerotic change.  CTA Head and Neck 07/22/2019 IMPRESSION: 1. No acute intracranial abnormality. Atrophy and chronic microvascular ischemic change in the white matter. 2. No significant carotid or vertebral artery stenosis in the neck. Mild atherosclerotic disease in both carotid arteries and right vertebral artery. 3. No intracranial large vessel occlusion or flow limiting stenosis.  Transthoracic Echocardiogram  1. Left ventricular ejection fraction, by estimation, is 55 to 60%. The  left ventricle has normal function. The left ventricle has no regional  wall motion abnormalities. There is moderate left ventricular hypertrophy.  Left ventricular diastolic  parameters are consistent with Grade I diastolic dysfunction (impaired  relaxation).  2. Right ventricular systolic function is normal. The right ventricular  size is normal. There is  normal pulmonary artery systolic pressure. The  estimated right ventricular systolic pressure is [id number] mmHg.  3. The mitral valve is grossly normal. Mild mitral valve regurgitation.  4. The aortic valve is tricuspid. Aortic valve regurgitation is not  visualized. Mild aortic valve sclerosis is present, with no evidence of  aortic valve stenosis.  5. The inferior vena cava is normal in size with greater than 50%  respiratory variability, suggesting right atrial pressure of 3 mmHg.    ECG - SR rate 76 BPM. (See cardiology reading for complete details)   PHYSICAL EXAM  Temp:  [98.1 F (36.7  C)-99.3 F (37.4 C)] 98.7 F (37.1 C) (05/16 1521) Pulse Rate:  [73-89] 77 (05/16 1521) Resp:  [15-24] 19 (05/16 1521) BP: (133-190)/(84-104) 144/89 (05/16 1521) SpO2:  [95 %-100 %] 97 % (05/16 1521) Weight:  [79.8 kg] 79.8 kg (05/15 2042)  General - Well nourished, well developed, in no apparent distress.  Ophthalmologic - fundi not visualized due to noncooperation.  Cardiovascular - Regular rhythm and rate.  Mental Status -  Level of arousal and orientation to time, place, and person were intact. Language including expression, naming, repetition, comprehension was assessed and found intact except occasional word finding difficulty. Fund of Knowledge was assessed and was intact.  Cranial Nerves II - XII - II - Visual field intact OU. III, IV, VI - Extraocular movements intact. V - Facial sensation intact bilaterally. VII - Facial movement intact bilaterally. VIII - Hearing & vestibular intact bilaterally. X - Palate elevates symmetrically. XI - Chin turning & shoulder shrug intact bilaterally. XII - Tongue protrusion intact.  Motor Strength - The patient's strength was normal in all extremities and pronator drift was absent.  Bulk was normal and fasciculations were absent.   Motor Tone - Muscle tone was assessed at the neck and appendages and was normal.  Reflexes - The patient's reflexes were symmetrical in all extremities and he had no pathological reflexes.  Sensory - Light touch, temperature/pinprick were assessed and were symmetrical.    Coordination - The patient had normal movements in the hands and feet with no ataxia or dysmetria.  Tremor was absent.  Gait and Station - deferred.   ASSESSMENT/PLAN Mr. [name] is a 85 y.o. 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 The pt does not believe this was a seizure has he did not experience his typical post ictal period. He did not receive IV t-PA due to late presentation (>4.5 hours from time of onset).  Stroke: Small acute infarct in the posterior left frontal lobe - embolic - source unclear.   CT head - No acute intracranial findings.   MRI head - Small acute infarct in the posterior left frontal lobe. Moderate chronic small vessel ischemic disease.  MRA head - neg  CTA H&N - 07/22/19 - Mild atherosclerotic disease in both carotid arteries and right vertebral artery. No intracranial large vessel occlusion or flow limiting stenosis.   2D Echo EF 55-60%  [employer] Virus 2 - negative  LDL - 84  HgbA1c - 6.0  UDS - negative  VTE prophylaxis - IV Heparin  aspirin 81 mg daily and Edoxaban prior to admission, now on heparin IV and ASA 81. Will switch back to  endoxaban. However, given the failure of endoxaban, will consider change to pradaxa if insurance able to cover. Otherwise, may change to eliquis bid dosing.   Patient will be counseled to be compliant with his antithrombotic medications  Ongoing aggressive stroke risk factor management  Therapy recommendations:  Home PT/OT  Disposition:  Pending  ?? 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  It could be small vessel event versus peripheral VI nerve palsy due to diabetes.  If it was a real cerebral event with stroke, then further indicate endoxaban failure  Tachycardia  History of tachycardia and possible syncope  Loop recorder placed in 2015, and out of battery 2019.  Undefined a few episode of atrial tachycardia, most time sinus tachycardia although cannot rule out brief ectopic atrial tachycardia.  Continued on endoxaban  Pt  stated compliance with endoxaban and ASA 81  History of saddle PE  Has been on endoxaban 60mg  daily since  Patient pays $4 per month  Will consider to change to pradaxa or eliquis - will find out insurance coverage  Hypertension  Home BP meds: Zestril  Current BP meds: none  . Blood pressure somewhat high at times . Long-term BP goal normotensive  Hyperlipidemia  Home Lipid lowering medication: Pravachol 80 mg daily  LDL 84, goal < 70  Current lipid lowering medication: Lipitor 80 mg daily  Continue statin at discharge  Diabetes  Home diabetic meds: none   HgbA1c 6.0, goal < 7.0  SSI  CBG monitoring  Close PCP follow-up  Other Stroke Risk Factors  Advanced age  Former cigarette smoker - quit  Other Active Problems  Code status - Full code  Seizure history - on [facility] day # 0  Rosalin Hawking, MD PhD Stroke Neurology 08/10/2019 7:13 PM   To contact Stroke Continuity provider, please refer to [email or web address] After hours, contact General Neurology

## 2019-08-10 NOTE — Progress Notes (Signed)
ANTICOAGULATION CONSULT NOTE - Initial Consult  Pharmacy Consult for heparin Indication: H/o PE/CVA  No Known Allergies  Patient Measurements: Height: 6' (182.9 cm) Weight: 79.8 kg (176 lb) IBW/kg (Calculated) : 77.6 Heparin Dosing Weight: 79 kg   Vital Signs: Temp: 98.5 F (36.9 C) (05/16 1145) Temp Source: Oral (05/16 1145) BP: 139/103 (05/16 1145) Pulse Rate: 89 (05/16 1145)  Labs: Recent Labs    08/09/19 1250 08/10/19 0443 08/10/19 1423  HGB 14.7 14.1  --   HCT 45.9 42.0  --   PLT 195 194  --   APTT  --  99* 104*  LABPROT 12.7  --   --   INR 1.0  --   --   HEPARINUNFRC  --  0.57 0.79*  CREATININE 0.99 0.85  --     Estimated Creatinine Clearance: 73.5 mL/min (by C-G formula based on SCr of 0.85 mg/dL).   Medical History: Past Medical History:  Diagnosis Date  . Anemia   . Cancer [facility])    Prostate  . Cervicalgia   . Essential hypertension, benign   . History of loop recorder 07/14/2014  . Impotence of organic origin   . Insomnia   . Pulmonary embolism ([place])   . Pure hypercholesterolemia   . Saddle pulmonary embolus ([place])   . Seizures ([place])   . Type II or unspecified type diabetes mellitus without mention of complication, not stated as uncontrolled     Medications:  Medications Prior to Admission  Medication Sig Dispense Refill Last Dose  . aspirin EC 81 MG tablet Take 1 tablet (81 mg total) by mouth daily. 90 tablet 11 08/08/2019 at Unknown time  . edoxaban (SAVAYSA) 60 MG TABS tablet Take 60 mg by mouth daily. 30 tablet 11 08/08/2019 at 1900  . ferrous sulfate 325 (65 FE) MG EC tablet Take 1 tablet (325 mg total) by mouth 2 (two) times daily. 60 tablet 5 08/08/2019 at Unknown time  . levETIRAcetam (KEPPRA) 750 MG tablet Take 2 tablets (1,500 mg total) by mouth 2 (two) times daily. (Patient taking differently: Take 1,500-2,250 mg by mouth See admin instructions. 1500 mg in am, 2250 mg in pm) 360 tablet 4 08/09/2019 at Unknown time  . lisinopril (ZESTRIL)  30 MG tablet Take 1 tablet (30 mg total) by mouth daily. 30 tablet 1 08/09/2019 at Unknown time  . pravastatin (PRAVACHOL) 80 MG tablet Take 1 tablet (80 mg total) by mouth at bedtime. 30 tablet 1 08/08/2019 at Unknown time    Assessment: 70 YOF on edoxaban for h/o PE here with new acute ischemic stroke on MRI. Pharmacy consulted to transition to IV heparin. Discussed case with neurology. H/H and Plt wnl.   Last dose of edoxaban was 5/14 @1900   PTT and heparin level came back above goal again. We will hold for 30 minutes then decrease drip. Heparin level is coordinating with PTT.    Goal of Therapy:  Heparin level 0.3-0.5 units/ml aPTT 66-85 seconds Monitor platelets by anticoagulation protocol: Yes   Plan:  Hold heparin x 30 minutes  Decrease rate to 950 units/hr F/u 8 hr HL -Monitor daily HL, CBC and s/s of bleeding   Onnie Boer, PharmD, BCIDP, AAHIVP, CPP Infectious Disease Pharmacist 08/10/2019 3:12 PM
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
## 2019-08-10 NOTE — Procedures (Signed)
Echo attempted. Patient not in room. Will attempt again later. 

## 2019-08-10 NOTE — Progress Notes (Signed)
Occupational Therapy Progress Note  Pt seen for second session to assist to BR.  Pt with poor carry over of info provided during last session as he did not recall needing to call for assistance and to not get up without assist.  He requires supervision for functional transfers.  Reinforced safety precautions and need to call  For assist to prevent falls due to multiple lines - pt seems to have poor awareness of lines when ambulating almost stepping on them multiple times and moving at a fast rate of speed despite cues to slow down and watch his lines.   Recommend OPOT    08/10/19 1400  OT Visit Information  Last OT Received On 08/10/19  Assistance Needed +1  History of Present Illness This 85 y.o. male admitted for difficulties with speech.  MRI of brain showed acute Lt posterior frontal CVA.  PMH includes:  PE, sz disorder, DM, HTN, prostate CA, h/o CVA   Precautions  Precautions Fall  Cognition  Arousal/Alertness Awake/alert  Behavior During Therapy Impulsive;WFL for tasks assessed/performed  Overall Cognitive Status No family/caregiver present to determine baseline cognitive functioning  Area of Impairment Attention;Memory;Following commands;Safety/judgement;Awareness;Problem solving  Current Attention Level Sustained;Selective  Memory Decreased short-term memory  Following Commands Follows one step commands inconsistently;Follows multi-step commands consistently  Safety/Judgement Decreased awareness of safety  Awareness Emergent  Problem Solving Requires verbal cues  General Comments Pt continues to be highly impulsive. He demonstrates decreased recall of being told to call for assist before getting up.  Verbal cues provided for problem solving and safety   Upper Extremity Assessment  Upper Extremity Assessment Overall WFL for tasks assessed  Lower Extremity Assessment  Lower Extremity Assessment Defer to PT evaluation  ADL  Overall ADL's  Needs assistance/impaired  Toilet  Transfer Supervision/safety;Ambulation;Comfort height toilet  Toileting- Clothing Manipulation and Hygiene Supervision/safety;Sit to/from stand  Functional mobility during ADLs Supervision/safety  Balance  Overall balance assessment Needs assistance  Sitting-balance support No upper extremity supported  Sitting balance-Leahy Scale Normal  Standing balance support During functional activity;No upper extremity supported  Standing balance-Leahy Scale Good  Standing balance comment Pt has an old ankle/foot injury, and is able to maintain static standing without assist.  He is able bend forward to access toes and adjust socks without LOB.  Transfers  Overall transfer level Needs assistance  Equipment used None  Transfers Sit to/from [employer] Transfers  Sit to Stand Supervision  Stand pivot transfers Supervision  General transfer comment supervision for safety as he is impulsive   General Comments  General comments (skin integrity, edema, etc.) Reinforced safety precautions with pt and need to call for assistance if he needs to get up.  Urinal placed next to pt   OT - End of Session  Equipment Utilized During Treatment Gait belt  Activity Tolerance Patient tolerated treatment well  Patient left in chair;with call bell/phone within reach;with chair alarm set  Nurse Communication Mobility status  OT Assessment/Plan  OT Plan Discharge plan remains appropriate  OT Visit Diagnosis Unsteadiness on feet (R26.81);Cognitive communication deficit (R41.841)  Symptoms and signs involving cognitive functions Cerebral infarction  OT Frequency (ACUTE ONLY) Min 2X/week  Follow Up Recommendations Outpatient OT;Supervision - Intermittent (direct supervision with money, financial management, cooking)  OT Equipment None recommended by OT  AM-PAC OT "6 Clicks" Daily Activity Outcome Measure (Version 2)  Help from another person eating meals? 4  Help from another person taking care of personal  grooming? 3  Help from another person toileting,  which includes using toliet, bedpan, or urinal? 3  Help from another person bathing (including washing, rinsing, drying)? 3  Help from another person to put on and taking off regular upper body clothing? 3  Help from another person to put on and taking off regular lower body clothing? 3  6 Click Score 19  OT Goal Progression  Progress towards OT goals Progressing toward goals  OT Time Calculation  OT Start Time (ACUTE ONLY) 1246  OT Stop Time (ACUTE ONLY) 1257  OT Time Calculation (min) 11 min  OT General Charges  $OT Visit 1 Visit  OT Treatments  $Self Care/Home Management  8-22 mins  Nilsa Nutting., OTR/L Acute Rehabilitation Services Pager [phone] Office [phone]

## 2019-08-11 LAB — CBC WITH DIFFERENTIAL/PLATELET
Abs Immature Granulocytes: 0.01 10*3/uL (ref 0.00–0.07)
Basophils Absolute: 0 10*3/uL (ref 0.0–0.1)
Basophils Relative: 1 %
Eosinophils Absolute: 0.1 10*3/uL (ref 0.0–0.5)
Eosinophils Relative: 2 %
HCT: 42.4 % (ref 39.0–52.0)
Hemoglobin: 14 g/dL (ref 13.0–17.0)
Immature Granulocytes: 0 %
Lymphocytes Relative: 31 %
Lymphs Abs: 1.4 10*3/uL (ref 0.7–4.0)
MCH: 32.3 pg (ref 26.0–34.0)
MCHC: 33 g/dL (ref 30.0–36.0)
MCV: 97.7 fL (ref 80.0–100.0)
Monocytes Absolute: 0.6 10*3/uL (ref 0.1–1.0)
Monocytes Relative: 14 %
Neutro Abs: 2.4 10*3/uL (ref 1.7–7.7)
Neutrophils Relative %: 52 %
Platelets: 179 10*3/uL (ref 150–400)
RBC: 4.34 MIL/uL (ref 4.22–5.81)
RDW: 12.3 % (ref 11.5–15.5)
WBC: 4.5 10*3/uL (ref 4.0–10.5)
nRBC: 0 % (ref 0.0–0.2)

## 2019-08-11 LAB — BASIC METABOLIC PANEL
Anion gap: 8 (ref 5–15)
BUN: 14 mg/dL (ref 8–23)
CO2: 26 mmol/L (ref 22–32)
Calcium: 9.2 mg/dL (ref 8.9–10.3)
Chloride: 105 mmol/L (ref 98–111)
Creatinine, Ser: 0.99 mg/dL (ref 0.61–1.24)
GFR calc Af Amer: >60 mL/min (ref >60)
GFR calc non Af Amer: >60 mL/min (ref >60)
Glucose, Bld: 101 mg/dL — ABNORMAL HIGH (ref 70–99)
Potassium: 3.8 mmol/L (ref 3.5–5.1)
Sodium: 139 mmol/L (ref 135–145)

## 2019-08-11 MED ORDER — AMLODIPINE BESYLATE 5 MG PO TABS: 5.0000 mg | ORAL_TABLET | Freq: Once | ORAL | Status: DC

## 2019-08-11 MED ORDER — LEVETIRACETAM 750 MG PO TABS
1500.0000 mg | ORAL_TABLET | Freq: Every day | ORAL | Status: DC
  Administered 2019-08-11: 1500 mg via ORAL
  Filled 2019-08-11: qty 2

## 2019-08-11 MED ORDER — ATORVASTATIN CALCIUM 80 MG PO TABS: 80.0000 mg | ORAL_TABLET | Freq: Every day | ORAL | 0 refills | Status: AC

## 2019-08-11 MED ORDER — AMLODIPINE BESYLATE 5 MG PO TABS
5.0000 mg | ORAL_TABLET | Freq: Once | ORAL | Status: AC
  Administered 2019-08-11: 5 mg via ORAL
  Filled 2019-08-11: qty 1

## 2019-08-11 MED ORDER — AMLODIPINE BESYLATE 5 MG PO TABS
5.0000 mg | ORAL_TABLET | Freq: Every day | ORAL | Status: DC
  Administered 2019-08-11: 5 mg via ORAL
  Filled 2019-08-11: qty 1

## 2019-08-11 MED ORDER — LEVETIRACETAM 750 MG PO TABS: 2250.0000 mg | ORAL_TABLET | Freq: Every day | ORAL | Status: DC

## 2019-08-11 MED ORDER — AMLODIPINE BESYLATE 10 MG PO TABS: 10.0000 mg | ORAL_TABLET | Freq: Every day | ORAL | 0 refills | Status: AC

## 2019-08-11 MED ORDER — AMLODIPINE BESYLATE 10 MG PO TABS: 10.0000 mg | ORAL_TABLET | Freq: Every day | ORAL | Status: DC
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
## 2019-08-11 NOTE — Plan of Care (Signed)
  Problem: Education: Goal: Knowledge of disease or condition will improve 08/11/2019 1112 by Abdul-Mutakallim, Joycelyn Schmid, RN Outcome: Progressing 08/11/2019 1111 by Weyman Pedro, RN Outcome: Progressing Goal: Knowledge of secondary prevention will improve 08/11/2019 1112 by Abdul-Mutakallim, Joycelyn Schmid, RN Outcome: Progressing 08/11/2019 1111 by Weyman Pedro, RN Outcome: Progressing   Problem: Coping: Goal: Will verbalize positive feelings about self 08/11/2019 1112 by Abdul-Mutakallim, Joycelyn Schmid, RN Outcome: Progressing 08/11/2019 1111 by Weyman Pedro, RN Outcome: Progressing   Problem: Self-Care: Goal: Verbalization of feelings and concerns over difficulty with self-care will improve 08/11/2019 1112 by Abdul-Mutakallim, Joycelyn Schmid, RN Outcome: Progressing 08/11/2019 1111 by Weyman Pedro, RN Outcome: Progressing Goal: Ability to communicate needs accurately will improve 08/11/2019 1112 by Abdul-Mutakallim, Joycelyn Schmid, RN Outcome: Progressing 08/11/2019 1111 by Weyman Pedro, RN Outcome: Progressing

## 2019-08-11 NOTE — TOC Transition Note (Addendum)
Transition of Care [facility]) - CM/SW Discharge Note   Patient Details  Name: Finnly Brietzke MRN: [id number] Date of Birth: 1935/01/26  [facility] CM/SW Contact:  Pollie Friar, RN Phone Number: 08/11/2019, 12:40 PM   Clinical Narrative:    Pt with recommendations for [facility] vs outpatient. He prefers outpatient therapy. CM has placed orders in Epic and information on the AVS. Pt has needed transportation and supervision. Pt states he has a rolling walker at the home.  Pt has transport home when medically ready.  1650: pt is without medication coverage under his medicare. CM has provided him the number for [employer] to assist him in getting him in getting medication coverage added.   Final next level of care: OP Rehab Barriers to Discharge: Continued Medical Work up   Patient Goals and CMS Choice     Choice offered to / list presented to : Patient  Discharge Placement                       Discharge Plan and Services                                     Social Determinants of Health (SDOH) Interventions     Readmission Risk Interventions No flowsheet data found.

## 2019-08-11 NOTE — Progress Notes (Signed)
PT Cancellation Note  Patient Details Name: [name] MRN: [id number] DOB: 25-Sep-1934   Cancelled Treatment:    Reason Eval/Treat Not Completed: Patient unavailable x 2: 1) working with OT, 2) with Case Manager  Will attempt to see 5/18 if pt remains inpt.   [name], PT Pager [phone]    [name] 08/11/2019, 4:49 PM

## 2019-08-11 NOTE — Plan of Care (Signed)
Adequate for discharge.

## 2019-08-11 NOTE — TOC CAGE-AID Note (Signed)
Transition of Care [facility]) - CAGE-AID Screening   Patient Details  Name: [name] MRN: [id number] Date of Birth: 1934/06/28  Transition of Care [facility]) CM/SW Contact:    Emeterio Reeve, [place] Phone Number: 08/11/2019, 3:24 PM   Clinical Narrative:  Pt denied alcohol and substance use.   CAGE-AID Screening:    Have You Ever Felt You Ought to Cut Down on Your Drinking or Drug Use?: No Have People Annoyed You By Critizing Your Drinking Or Drug Use?: No Have You Felt Bad Or Guilty About Your Drinking Or Drug Use?: No Have You Ever Had a Drink or Used Drugs First Thing In The Morning to STeady Your Nerves or to Get Rid of a Hangover?: No CAGE-AID Score: 0       Blima Ledger, [place] Social Worker [phone]

## 2019-08-11 NOTE — Discharge Instructions (Signed)
Eating Plan After Stroke A stroke causes damage to the brain cells, which can affect your ability to walk, talk, and even eat. The impact of a stroke is different for everyone, and so is recovery. A good nutrition plan is important for your recovery. It can also lower your risk of another stroke. If you have difficulty chewing and swallowing your food, a dietitian or your stroke care team can help so that you can enjoy eating healthy foods. What are tips for following this plan?  Reading food labels  Choose foods that have less than 300 milligrams (mg) of sodium per serving. Limit your sodium intake to less than 1,500 mg per day.  Avoid foods that have saturated fat and trans fat.  Choose foods that are low in cholesterol. Limit the amount of cholesterol you eat each day to less than 200 mg.  Choose foods that are high in fiber. Eat 20-30 grams (g) of fiber each day.  Avoid foods with added sugar. Check the food label for ingredients such as sugar, corn syrup, honey, fructose, molasses, and cane juice. Shopping  At the grocery store, buy most of your food from areas near the walls of the store. This includes: ? Fresh fruits and vegetables. ? Dry grains, beans, nuts, and seeds. ? Fresh seafood, poultry, lean meats, and eggs. ? Low-fat dairy products.  Buy whole ingredients instead of prepackaged foods.  Buy fresh, in-season fruits and vegetables from local farmers markets.  Buy frozen fruits and vegetables in resealable bags. Cooking  Prepare foods with very little salt. Use herbs or salt-free spices instead.  Cook with heart-healthy oils, such as olive, avocado, canola, soybean, or sunflower oil.  Avoid frying foods. Bake, grill, or broil foods instead.  Remove visible fat and skin from meat and poultry before eating.  Modify food textures as told by your health care provider. Meal planning  Eat a wide variety of colorful fruits and vegetables. Make sure one-half of your  plate is filled with fruits and vegetables at each meal.  Eat fruits and vegetables that are high in potassium, such as: ? Apples, bananas, oranges, and melon. ? Sweet potatoes, spinach, zucchini, and tomatoes.  Eat fish that contain heart-healthy fats (omega-3 fats) at least twice a week. These include salmon, tuna, mackerel, and sardines.  Eat plant foods that are high in omega-3 fats, such as flaxseeds and walnuts. Add these to cereals, yogurt, or pasta dishes.  Eat several servings of high-fiber foods each day, such as fruits, vegetables, whole grains, and beans.  Do not put salt at the table for meals.  When eating out at restaurants: ? Ask the server about low-salt or salt-free food options. ? Avoid fried foods. Look for menu items that are grilled, steamed, broiled, or roasted. ? Ask if your food can be prepared without butter. ? Ask for condiments, such as salad dressings, gravy, or sauces to be served on the side.  If you have difficulty swallowing: ? Choose foods that are softer and easier to chew and swallow. ? Cut foods into small pieces and chew well before swallowing. ? Thicken liquids as told by your health care provider or dietitian. ? Let your health care provider know if your condition does not improve over time. You may need to work with a speech therapist to re-train the muscles that are used for eating. General recommendations  Involve your family and friends in your recovery, if possible. It may be helpful to have a slower meal time   and to plan meals that include foods everyone in the family can eat.  Brush your teeth with fluoride toothpaste twice a day, and floss once a day. Keeping a clean mouth can help you swallow and can also help your appetite.  Drink enough water each day to keep your urine pale yellow. If needed, set reminders or ask your family to help you remember to drink water.  Limit alcohol intake to no more than 1 drink a day for nonpregnant  women and 2 drinks a day for men. One drink equals 12 oz of beer, 5 oz of wine, or 1 oz of hard liquor. Summary  Following this eating plan can help in your stroke recovery and can decrease your risk for another stroke.  Let your health care provider know if you have problems with swallowing. You may need to work with a speech therapist. This information is not intended to replace advice given to you by your health care provider. Make sure you discuss any questions you have with your health care provider. Document Revised: 07/04/2018 Document Reviewed: 05/21/2017 Elsevier Patient Education  [place] After a Stroke Caregivers provide essential physical and emotional support to people who have had a stroke. If you are supporting someone who has had a stroke, you play an important role in coordinating schedules, helping your loved one to communicate, and helping with the overall rehabilitation plan. What do I need to know about my loved one's recovery? Recovering from a stroke can take weeks, months, or years. Some people may be able to return to a normal lifestyle, and other people may have permanent problems with movement (mobility), thinking, behavior, or communication. Understanding your loved one's condition can help you manage the role of caregiver. Your loved one's health care team may rely on you to provide information such as medical history and current medicines. How can I support my friend or family member? Planning for discharge from the hospital Ask to meet with a social worker or a stroke care coordinator, if one is available. This person can help you to plan for discharge. Before you leave the hospital, make sure you understand:  The recovery process after a stroke.  Physical, emotional, behavioral, and other changes that may affect your loved one after a stroke.  The treatments for stroke, including the medicines that your loved one has to  take.  How to lower the risk of another stroke.  Diet and exercise changes for your loved one.  Whether you will need extra help at home.  Whether your loved one will need help using the bathroom, bathing, eating, or doing other activities.  Changes you have to make at home to make it safe for your loved one.  Whether you need to get special devices or equipment for your loved one. General help  Help your loved one establish a daily routine. This may include setting reminders or having a shared day planner or calendar.  Encourage rest. Your loved one may need frequent breaks during social situations or other activities.  Be patient. Your loved one may take longer to complete tasks and to process information.  When giving instructions, give only one instruction at a time or give step-by-step lists. Multitasking can be difficult after a stroke.  Offer assistance with household chores or other daily tasks. Make "freezer meals" that can be reheated.  Do not set expectations about your loved one's recovery. Medical visits  Gently remind your loved one of  tasks and medical visits if he or she is forgetful.  Provide transportation to and from appointments.  Attend rehabilitation appointments with your loved one. By being involved in the rehabilitation plan, you can encourage your loved one and help with exercises and therapy activities at home. Preventing falls   Follow instructions to prevent falls in your loved one's home. These may include: ? Installing grab bars in bathrooms and handrails in stairways. ? Using night-lights in the bedroom, bathroom, or hallways. ? Removing rugs and mats or making them stick to the floor. ? Keeping walkways clear by removing cords and clutter from the floor. Managing finances  Help to manage your loved one's finances. Talk with a Education officer, museum or legal professional if: ? Your loved one is unable to return to work and is in need of financial  help. ? You need help paying medical bills. ? You need to establish guardianship over finances. ? You need help with estate planning. How should I care for myself? Helping a loved one recover from a stroke can be rewarding, and it can also be challenging and stressful at times. Make sure to care for your own well-being during this time. Lifestyle  Rest. Try to get 7-9 hours of uninterrupted sleep each night.  Eat a balanced diet that includes fresh fruits and vegetables, whole grains, lean proteins, and low-fat dairy.  Exercise for 30 or more minutes on 5 or more days each week.  Find ways to manage stress. These may include: ? Deep breathing, yoga, or meditation. ? Spending time outdoors. ? Journaling. Finding support   Ask for help. Take a break if you are the primary caregiver to your loved one.  Spend time with supportive people.  Join a support group with other caregivers or family members of people who have had a stroke.  If you experience new or worsening depression or anxiety, seek counseling from a mental health professional. Where to find more information You may find more information about supporting someone who has had a stroke from:  National Stroke Association: FetchFilms.dk  American Stroke Association: www.strokeassociation.org/STROKEORG Summary  Caregivers provide essential physical and emotional support to people who have had a stroke.  Before you leave the hospital, make sure you understand how to care for someone who has had a stroke.  It is normal to have many different emotions while caring for someone who has had a stroke. Make sure to care for your own well-being during this time. This information is not intended to replace advice given to you by your health care provider. Make sure you discuss any questions you have with your health care provider. Document Revised: 07/04/2018 Document Reviewed: 06/30/2016 Elsevier  Patient Education  [place] DASH stands for "Dietary Approaches to Stop Hypertension." The DASH eating plan is a healthy eating plan that has been shown to reduce high blood pressure (hypertension). It may also reduce your risk for type 2 diabetes, heart disease, and stroke. The DASH eating plan may also help with weight loss. What are tips for following this plan?  General guidelines  Avoid eating more than 2,300 mg (milligrams) of salt (sodium) a day. If you have hypertension, you may need to reduce your sodium intake to 1,500 mg a day.  Limit alcohol intake to no more than 1 drink a day for nonpregnant women and 2 drinks a day for men. One drink equals 12 oz of beer, 5 oz of wine, or 1 oz of  hard liquor.  Work with your health care provider to maintain a healthy body weight or to lose weight. Ask what an ideal weight is for you.  Get at least 30 minutes of exercise that causes your heart to beat faster (aerobic exercise) most days of the week. Activities may include walking, swimming, or biking.  Work with your health care provider or diet and nutrition specialist (dietitian) to adjust your eating plan to your individual calorie needs. Reading food labels   Check food labels for the amount of sodium per serving. Choose foods with less than 5 percent of the Daily Value of sodium. Generally, foods with less than 300 mg of sodium per serving fit into this eating plan.  To find whole grains, look for the word "whole" as the first word in the ingredient list. Shopping  Buy products labeled as "low-sodium" or "no salt added."  Buy fresh foods. Avoid canned foods and premade or frozen meals. Cooking  Avoid adding salt when cooking. Use salt-free seasonings or herbs instead of table salt or sea salt. Check with your health care provider or pharmacist before using salt substitutes.  Do not fry foods. Cook foods using healthy methods such as baking, boiling,  grilling, and broiling instead.  Cook with heart-healthy oils, such as olive, canola, soybean, or sunflower oil. Meal planning  Eat a balanced diet that includes: ? 5 or more servings of fruits and vegetables each day. At each meal, try to fill half of your plate with fruits and vegetables. ? Up to 6-8 servings of whole grains each day. ? Less than 6 oz of lean meat, poultry, or fish each day. A 3-oz serving of meat is about the same size as a deck of cards. One egg equals 1 oz. ? 2 servings of low-fat dairy each day. ? A serving of nuts, seeds, or beans 5 times each week. ? Heart-healthy fats. Healthy fats called Omega-3 fatty acids are found in foods such as flaxseeds and coldwater fish, like sardines, salmon, and mackerel.  Limit how much you eat of the following: ? Canned or prepackaged foods. ? Food that is high in trans fat, such as fried foods. ? Food that is high in saturated fat, such as fatty meat. ? Sweets, desserts, sugary drinks, and other foods with added sugar. ? Full-fat dairy products.  Do not salt foods before eating.  Try to eat at least 2 vegetarian meals each week.  Eat more home-cooked food and less restaurant, buffet, and fast food.  When eating at a restaurant, ask that your food be prepared with less salt or no salt, if possible. What foods are recommended? The items listed may not be a complete list. Talk with your dietitian about what dietary choices are best for you. Grains Whole-grain or whole-wheat bread. Whole-grain or whole-wheat pasta. Brown rice. [place]. Bulgur. Whole-grain and low-sodium cereals. Pita bread. Low-fat, low-sodium crackers. Whole-wheat flour tortillas. Vegetables Fresh or frozen vegetables (raw, steamed, roasted, or grilled). Low-sodium or reduced-sodium tomato and vegetable juice. Low-sodium or reduced-sodium tomato sauce and tomato paste. Low-sodium or reduced-sodium canned vegetables. Fruits All fresh, dried, or frozen  fruit. Canned fruit in natural juice (without added sugar). Meat and other protein foods Skinless chicken or [place]. Ground chicken or [place]. Pork with fat trimmed off. Fish and seafood. Egg whites. Dried beans, peas, or lentils. Unsalted nuts, nut butters, and seeds. Unsalted canned beans. Lean cuts of beef with fat trimmed off. Low-sodium, lean deli meat. Dairy Low-fat (1%)  or fat-free (skim) milk. Fat-free, low-fat, or reduced-fat cheeses. Nonfat, low-sodium ricotta or cottage cheese. Low-fat or nonfat yogurt. Low-fat, low-sodium cheese. Fats and oils Soft margarine without trans fats. Vegetable oil. Low-fat, reduced-fat, or light mayonnaise and salad dressings (reduced-sodium). Canola, safflower, olive, soybean, and sunflower oils. Avocado. Seasoning and other foods Herbs. Spices. Seasoning mixes without salt. Unsalted popcorn and pretzels. Fat-free sweets. What foods are not recommended? The items listed may not be a complete list. Talk with your dietitian about what dietary choices are best for you. Grains Baked goods made with fat, such as croissants, muffins, or some breads. Dry pasta or rice meal packs. Vegetables Creamed or fried vegetables. Vegetables in a cheese sauce. Regular canned vegetables (not low-sodium or reduced-sodium). Regular canned tomato sauce and paste (not low-sodium or reduced-sodium). Regular tomato and vegetable juice (not low-sodium or reduced-sodium). Angie Fava. Olives. Fruits Canned fruit in a light or heavy syrup. Fried fruit. Fruit in cream or butter sauce. Meat and other protein foods Fatty cuts of meat. Ribs. Fried meat. Berniece Salines. Sausage. Bologna and other processed lunch meats. Salami. Fatback. Hotdogs. Bratwurst. Salted nuts and seeds. Canned beans with added salt. Canned or smoked fish. Whole eggs or egg yolks. Chicken or [place] with skin. Dairy Whole or 2% milk, cream, and half-and-half. Whole or full-fat cream cheese. Whole-fat or sweetened yogurt. Full-fat  cheese. Nondairy creamers. Whipped toppings. Processed cheese and cheese spreads. Fats and oils Butter. Stick margarine. Lard. Shortening. Ghee. Bacon fat. Tropical oils, such as coconut, palm kernel, or palm oil. Seasoning and other foods Salted popcorn and pretzels. Onion salt, garlic salt, seasoned salt, table salt, and sea salt. Worcestershire sauce. Tartar sauce. Barbecue sauce. Teriyaki sauce. Soy sauce, including reduced-sodium. Steak sauce. Canned and packaged gravies. Fish sauce. Oyster sauce. Cocktail sauce. Horseradish that you find on the shelf. Ketchup. Mustard. Meat flavorings and tenderizers. Bouillon cubes. Hot sauce and Tabasco sauce. Premade or packaged marinades. Premade or packaged taco seasonings. Relishes. Regular salad dressings. Where to find more information:  National Heart, Lung, and [place]: https://wilson-eaton.com/  American Heart Association: www.heart.org Summary  The DASH eating plan is a healthy eating plan that has been shown to reduce high blood pressure (hypertension). It may also reduce your risk for type 2 diabetes, heart disease, and stroke.  With the DASH eating plan, you should limit salt (sodium) intake to 2,300 mg a day. If you have hypertension, you may need to reduce your sodium intake to 1,500 mg a day.  When on the DASH eating plan, aim to eat more fresh fruits and vegetables, whole grains, lean proteins, low-fat dairy, and heart-healthy fats.  Work with your health care provider or diet and nutrition specialist (dietitian) to adjust your eating plan to your individual calorie needs. This information is not intended to replace advice given to you by your health care provider. Make sure you discuss any questions you have with your health care provider. Document Revised: 02/23/2017 Document Reviewed: 03/06/2016 Elsevier Patient Education  2020 [employer].

## 2019-08-11 NOTE — Discharge Summary (Signed)
Discharge Summary  [name] [id number] DOB: 12/07/1934  PCP: Anthony Bender, PA-C  Admit date: 08/09/2019 Discharge date: 08/11/2019  Time spent: 35 minutes  Recommendations for Outpatient Follow-up:  1. Follow-up with neurology in 1 to 2 weeks 2. Follow-up with your PCP within a week 3. Take your medications as prescribed 4. Continue PT OT with assistance  5. Continue fall precautions  Discharge Diagnoses:  Active Hospital Problems   Diagnosis Date Noted  . Acute CVA (cerebrovascular accident) ([address]) 08/09/2019  . Cerebrovascular accident (CVA) ([place]) 06/26/2019  . Dyslipidemia 03/24/2014  . Hx pulmonary embolism 10/25/2013  . HTN (hypertension) 10/25/2013  . Seizures ([place]) 09/10/2012    Resolved Hospital Problems  No resolved problems to display.    Discharge Condition:  Stable  Diet recommendation: Resume previous diet.  Vitals:   08/11/19 1534 08/11/19 1704  BP: (!) 155/96 (!) 159/92  Pulse: 82 77  Resp: 20 20  Temp: 98.1 F (36.7 C)   SpO2: 97% 99%    History of present illness:  [name] a 85 y.o.malewith medical history significant forsaddle PE on Edoxaban, seizure disorder, diabetes mellitus type 2, hyperlipidemia, hypertension, prostate CA, who presented to [facility] ED with complaints of slurred speech, and difficulty getting words out. LKW on 08/08/2019 night, unspecified time by his brother who lives with him. On 08/09/2019, patient attempted to talk to his daughter, was not able to get the words out.  Patient reports compliance to his medications. He was seen on 06/2019 by outpatient neurologist who ordered a CT angiogram of the head and neck which showed no large vessel occlusion.  In the ED,  CT head unremarkable for any acute intracranial abnormality, MRI brain showed small acute infarct in the posterior left frontal lobe.  EDP consulted Neuro/stroke team Dr. Leonel Ramsay.  TRH asked to admit for stroke work up.  Suspected his posterior left frontal  lobe acute infarct is embolic with unclear source.  Due to concern for anticoagulation failure on Edoxaban, insurance check was placed for Pradaxa and Eliquis.  No good insurance coverage for both, co-pay in the 500 dollars range.   Coumadin is covered but unclear if it would provide better coverage than Edoxaban, deferring to neurology.  Plan is to continue Edoxaban per neurology.  08/11/19:  Seen and examined.  Gradually normalizing BP.  Started Norvasc 5 mg daily this morning, added another dose of 5 mg of Norvasc this PM.  Dose increased to 10 mg daily.  Home Lisinopril held.    He feels better and is eager to go home.  Vital signs and labs reviewed and are stable.  Seen by PT with recommendation for outpatient OT.  Seen by speech therapy; per speech, patient does not need any further speech-language pathology services.  No follow-up recommendations.    Hospital Course:  Principal Problem:   Acute CVA (cerebrovascular accident) [facility]) Active Problems:   Seizures ([place])   Hx pulmonary embolism   HTN (hypertension)   Dyslipidemia   Cerebrovascular accident (CVA) ([place])  Acute left frontal posterior CVA with self reported prior history of CVA Suspect embolic, source unclear. Presented with slurred speech and aphasia Aphasia is improving, seen by speech therapy, no follow-up recommendations. On high intensity statin, Lipitor 80 mg daily Edoxaban restarted, was initially on hep drip. Patient was on aspirin, Edoxaban, and Pravachol prior to his stroke He reports previous hx of CVA. Suspect anticoagulation failure on Edoxaban, insurance check was placed for Pradaxa and Eliquis. No good insurance coverage for both.  Continue Edoxaban per neuro Follow-up with neurology outpatient LDL 84, goal less than 70 A1c 6.0, goal less than 7.0, at goal. 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  It could be small vessel event versus peripheral VI nerve palsy due to diabetes. If it was a real cerebral event with stroke, then further indicate endoxaban failure Follow-up with neurology  Essential hypertension Gradually normalize BP in the next 5 to 7 days Started Norvasc 5 mg daily, dose increased to 10 mg daily as stated above. Follow-up with your PCP within a week for blood pressure check. Recommend DASH diet.  Hyperlipidemia LDL 84 Goal LDL less than 70 Continue high intensity statin, Lipitor 80 mg daily  History of saddle pulmonary embolism No acute issues Currently on Edoxaban O2 saturation 99% on room air  Seizure disorder On Keppra, resume home regimen of oral Keppra Keppra 1500 mg daily and 2250 mg qhs. No reported seizures Continue seizure precautions  Iron deficiency anemia Hemoglobin is stable continue iron supplement  Ambulatory dysfunction post stroke OT recommended outpatient OT Continue fall precautions.    Code Status:Full   Consults called:Neurology/stroke team.     Discharge Exam: BP (!) 159/92   Pulse 77   Temp 98.1 F (36.7 C) (Oral)   Resp 20   Ht 6' (1.829 m)   Wt 79.8 kg   SpO2 99%   BMI 23.87 kg/m  . General: 85 y.o. year-old male well developed well nourished in no acute distress.  Alert and oriented x3. . Cardiovascular: Regular rate and rhythm with no rubs or gallops.  No thyromegaly or JVD noted.   Marland Kitchen Respiratory: Clear to auscultation with no wheezes or rales. Good inspiratory effort. . Abdomen: Soft nontender nondistended with normal bowel sounds x4 quadrants. . Musculoskeletal: No lower extremity edema. 2/4 pulses in all 4 extremities. Marland Kitchen Psychiatry: Mood is appropriate for condition and setting  Discharge  Instructions You were cared for by a hospitalist during your hospital stay. If you have any questions about your discharge medications or the care you received while you were in the hospital after you are discharged, you can call the unit and asked to speak with the hospitalist on call if the hospitalist that took care of you is not available. Once you are discharged, your primary care physician will handle any further medical issues. Please note that NO REFILLS for any discharge medications will be authorized once you are discharged, as it is imperative that you return to your primary care physician (or establish a relationship with a primary care physician if you do not have one) for your aftercare needs so that they can reassess your need for medications and monitor your lab values.  Discharge Instructions    Ambulatory referral to Occupational Therapy   Complete by: As directed    Ambulatory referral to Physical Therapy   Complete by: As directed      Allergies as of 08/11/2019   No Known Allergies     Medication List    STOP taking these medications   lisinopril 30 MG tablet Commonly known as: ZESTRIL   pravastatin 80 MG tablet Commonly known as: PRAVACHOL     TAKE these medications   amLODipine 10 MG tablet Commonly known as: NORVASC Take 1  tablet (10 mg total) by mouth daily. Start taking on: Aug 12, 2019   aspirin EC 81 MG tablet Take 1 tablet (81 mg total) by mouth daily.   atorvastatin 80 MG tablet Commonly known as: LIPITOR Take 1 tablet (80 mg total) by mouth daily. Start taking on: Aug 12, 2019   edoxaban 60 MG Tabs tablet Commonly known as: SAVAYSA Take 60 mg by mouth daily.   ferrous sulfate 325 (65 FE) MG EC tablet Take 1 tablet (325 mg total) by mouth 2 (two) times daily.   levETIRAcetam 750 MG tablet Commonly known as: KEPPRA Take 2 tablets (1,500 mg total) by mouth 2 (two) times daily. What changed:   how much to take  when to take  this  additional instructions      No Known Allergies Follow-up Information    Schedule an appointment as soon as possible for a visit  with Marcial Pacas, MD.   Specialty: Neurology Contact information: [place] [address] [phone]        [facility] Follow [facility].   Specialty: Rehabilitation Why: The Outpatient therapy will contact you for the first appointment Contact information: [place] [phone]       Medicare Coverage Follow up.   Why: Please use this number to get medication coverage started to assist with the cost of your medications. Contact information: [phone] CALL ASAP       Anthony Bender, PA-C. Call in 1 day(s).   Specialty: Physician Assistant Why: Please call for a post hospital follow up appointment. Contact information: [place] [address] [phone]            The results of significant diagnostics from this hospitalization (including imaging, microbiology, ancillary and laboratory) are listed below for reference.    Significant Diagnostic Studies: CT ANGIO HEAD W OR WO CONTRAST  Result Date: 07/22/2019 CLINICAL DATA:  Ataxia, suspect stroke.  History of prostate cancer. EXAM: CT ANGIOGRAPHY HEAD AND NECK TECHNIQUE: Multidetector CT imaging of the head and neck was performed using the standard protocol during bolus administration of intravenous contrast. Multiplanar CT image reconstructions and MIPs were obtained to evaluate the vascular anatomy. Carotid stenosis measurements (when applicable) are obtained utilizing NASCET criteria, using the distal internal carotid diameter as the denominator. CONTRAST:  117mL ISOVUE-300 IOPAMIDOL (ISOVUE-300) INJECTION 61% COMPARISON:  MRI head 06/23/2019 FINDINGS: CT HEAD FINDINGS Brain: Generalized mild to moderate atrophy. Negative for hydrocephalus.  Patchy white matter hypodensity bilaterally is chronic. No acute infarct, hemorrhage, mass. Vascular: Negative for hyperdense vessel Skull: Negative Sinuses: Negative Orbits: Right cataract extraction.  No orbital lesion. Review of the MIP images confirms the above findings CTA NECK FINDINGS Aortic arch: Standard branching. Imaged portion shows no evidence of aneurysm or dissection. No significant stenosis of the major arch vessel origins. Atherosclerotic calcification aortic arch. Right carotid system: Mild atherosclerotic calcification right common carotid artery and right carotid bifurcation without significant stenosis. Left carotid system: Mild atherosclerotic calcification left common carotid artery and left carotid bifurcation without significant stenosis. Vertebral arteries: Left vertebral artery dominant and widely patent. Small right vertebral artery with scattered atherosclerotic disease. Skeleton: Cervical spondylosis and kyphosis. No acute skeletal abnormality. Other neck: Negative Upper chest: No acute abnormality. Review of the MIP images confirms the above findings CTA HEAD FINDINGS Anterior circulation: Mild atherosclerotic calcification in the cavernous carotid bilaterally without stenosis. Anterior and middle cerebral arteries patent bilaterally without  significant stenosis. Posterior circulation: Right vertebral artery is small and ends in PICA. Left PICA is patent. Basilar artery patent. AICA, superior cerebellar, and posterior cerebral arteries patent bilaterally without stenosis. Venous sinuses: Normal venous enhancement Anatomic variants: None Review of the MIP images confirms the above findings IMPRESSION: 1. No acute intracranial abnormality. Atrophy and chronic microvascular ischemic change in the white matter. 2. No significant carotid or vertebral artery stenosis in the neck. Mild atherosclerotic disease in both carotid arteries and right vertebral artery. 3. No intracranial large vessel  occlusion or flow limiting stenosis. Electronically Signed   By: Franchot Gallo M.D.   On: 07/22/2019 14:30   DG Chest 2 View  Result Date: 08/09/2019 CLINICAL DATA:  CVA today. EXAM: CHEST - 2 VIEW COMPARISON:  Radiograph and chest CT 03/07/2019 FINDINGS: Implanted loop recorder in the left chest wall. The heart is normal in size. Unchanged mediastinal contours. No pulmonary edema, focal airspace disease, pleural effusion or pneumothorax. Eventration of right hemidiaphragm. No acute osseous abnormalities. IMPRESSION: No acute chest findings. Electronically Signed   By: Keith Rake M.D.   On: 08/09/2019 21:20   CT HEAD WO CONTRAST  Result Date: 08/09/2019 CLINICAL DATA:  Aphasia EXAM: CT HEAD WITHOUT CONTRAST TECHNIQUE: Contiguous axial images were obtained from the base of the skull through the vertex without intravenous contrast. COMPARISON:  07/22/2019 FINDINGS: Brain: No evidence of acute infarction, hemorrhage, hydrocephalus, extra-axial collection or mass lesion/mass effect. Scattered low-density changes within the periventricular and subcortical white matter compatible with chronic microvascular ischemic change. Mild diffuse cerebral volume loss. Vascular: Atherosclerotic calcifications involving the large vessels of the skull base. No unexpected hyperdense vessel. Skull: Normal. Negative for fracture or focal lesion. Sinuses/Orbits: No acute finding. Other: None. IMPRESSION: 1.  No acute intracranial findings. 2.  Chronic microvascular ischemic change and cerebral volume loss. Electronically Signed   By: Davina Poke D.O.   On: 08/09/2019 13:39   CT ANGIO NECK W OR WO CONTRAST  Result Date: 07/22/2019 CLINICAL DATA:  Ataxia, suspect stroke.  History of prostate cancer. EXAM: CT ANGIOGRAPHY HEAD AND NECK TECHNIQUE: Multidetector CT imaging of the head and neck was performed using the standard protocol during bolus administration of intravenous contrast. Multiplanar CT image  reconstructions and MIPs were obtained to evaluate the vascular anatomy. Carotid stenosis measurements (when applicable) are obtained utilizing NASCET criteria, using the distal internal carotid diameter as the denominator. CONTRAST:  136mL ISOVUE-300 IOPAMIDOL (ISOVUE-300) INJECTION 61% COMPARISON:  MRI head 06/23/2019 FINDINGS: CT HEAD FINDINGS Brain: Generalized mild to moderate atrophy. Negative for hydrocephalus. Patchy white matter hypodensity bilaterally is chronic. No acute infarct, hemorrhage, mass. Vascular: Negative for hyperdense vessel Skull: Negative Sinuses: Negative Orbits: Right cataract extraction.  No orbital lesion. Review of the MIP images confirms the above findings CTA NECK FINDINGS Aortic arch: Standard branching. Imaged portion shows no evidence of aneurysm or dissection. No significant stenosis of the major arch vessel origins. Atherosclerotic calcification aortic arch. Right carotid system: Mild atherosclerotic calcification right common carotid artery and right carotid bifurcation without significant stenosis. Left carotid system: Mild atherosclerotic calcification left common carotid artery and left carotid bifurcation without significant stenosis. Vertebral arteries: Left vertebral artery dominant and widely patent. Small right vertebral artery with scattered atherosclerotic disease. Skeleton: Cervical spondylosis and kyphosis. No acute skeletal abnormality. Other neck: Negative Upper chest: No acute abnormality. Review of the MIP images confirms the above findings CTA HEAD FINDINGS Anterior circulation: Mild atherosclerotic calcification in the cavernous carotid bilaterally without stenosis. Anterior and  middle cerebral arteries patent bilaterally without significant stenosis. Posterior circulation: Right vertebral artery is small and ends in PICA. Left PICA is patent. Basilar artery patent. AICA, superior cerebellar, and posterior cerebral arteries patent bilaterally without stenosis.  Venous sinuses: Normal venous enhancement Anatomic variants: None Review of the MIP images confirms the above findings IMPRESSION: 1. No acute intracranial abnormality. Atrophy and chronic microvascular ischemic change in the white matter. 2. No significant carotid or vertebral artery stenosis in the neck. Mild atherosclerotic disease in both carotid arteries and right vertebral artery. 3. No intracranial large vessel occlusion or flow limiting stenosis. Electronically Signed   By: Franchot Gallo M.D.   On: 07/22/2019 14:30   MR ANGIO HEAD WO CONTRAST  Result Date: 08/10/2019 CLINICAL DATA:  Speech disturbance. Follow-up acute infarction posterior frontal lobe. EXAM: MRA HEAD WITHOUT CONTRAST TECHNIQUE: Angiographic images of the Circle of Willis were obtained using MRA technique without intravenous contrast. COMPARISON:  MRI yesterday.  CT angiography 07/22/2019 FINDINGS: Both internal carotid arteries widely patent through the skull base and siphon regions. Upper cervical internal carotid on the right is tortuous but widely patent. Artifactual irregular appearance of the upper cervical ICA on the left, shown not to be present on the recent CT angiography. The anterior and middle cerebral vessels are normal without proximal stenosis, aneurysm or vascular malformation. More distal branch vessels do not show any evidence of large or medium vessel occlusion, correctable proximal stenosis or atherosclerotic irregularity. Both vertebral arteries are patent to the basilar with the left being dominant. No basilar stenosis. Superior cerebellar and posterior cerebral arteries appear patent and unremarkable. IMPRESSION: Negative intracranial MR angiography. No change appreciated since the CT angiography of 07/22/2019. No stenosis, large or medium vessel occlusion or MR evidence of widespread intracranial atherosclerotic change. Electronically Signed   By: Nelson Chimes M.D.   On: 08/10/2019 10:04   MR Brain Wo Contrast  (neuro protocol)  Result Date: 08/09/2019 CLINICAL DATA:  Speech difficulty. EXAM: MRI HEAD WITHOUT CONTRAST TECHNIQUE: Multiplanar, multiecho pulse sequences of the brain and surrounding structures were obtained without intravenous contrast. COMPARISON:  Head CT 08/09/2019 and MRI 06/23/2019 FINDINGS: Brain: There is an approximately 2 cm acute cortical infarct in the left precentral gyrus lateral to the hand motor area. No intracranial hemorrhage, mass, midline shift, or extra-axial fluid collection is identified. Patchy T2 hyperintensities in the cerebral white matter bilaterally are similar to the prior MRI and are nonspecific but compatible with moderate chronic small vessel ischemic disease. Generalized cerebral atrophy is mild for age. Vascular: Major intracranial vascular flow voids are preserved. Skull and upper cervical spine: No suspicious marrow lesion. Sinuses/Orbits: Right cataract extraction. Paranasal sinuses and mastoid air cells are clear. Other: None. IMPRESSION: 1. Small acute infarct in the posterior left frontal lobe. 2. Moderate chronic small vessel ischemic disease. Electronically Signed   By: Logan Bores M.D.   On: 08/09/2019 16:56   ECHOCARDIOGRAM COMPLETE  Result Date: 08/10/2019    ECHOCARDIOGRAM REPORT   Patient Name:   JEMARIO SCHWALBACH   Date of Exam: 08/10/2019 Medical Rec #:  [id number]  Height:       72.0 in Accession #:    [id number] Weight:       176.0 lb Date of Birth:  11-12-1934   BSA:          2.018 m Patient Age:    61 years   BP:           190/104 mmHg Patient Gender: M  HR:           97 bpm. Exam Location:  Inpatient Procedure: 2D Echo, Cardiac Doppler and Color Doppler Indications:    Stroke 434.91/I163.9  History:        Patient has prior history of Echocardiogram examinations, most                 recent 03/08/2019. Risk Factors:Diabetes, Former Smoker,                 Hypertension and Dyslipidemia. H/o pulmonary embolism.  Sonographer:    Clayton Lefort RDCS (AE)  Referring Phys: [id number] [place]  Sonographer Comments: Suboptimal parasternal window. Patient movement. IMPRESSIONS  1. Left ventricular ejection fraction, by estimation, is 55 to 60%. The left ventricle has normal function. The left ventricle has no regional wall motion abnormalities. There is moderate left ventricular hypertrophy. Left ventricular diastolic parameters are consistent with Grade I diastolic dysfunction (impaired relaxation).  2. Right ventricular systolic function is normal. The right ventricular size is normal. There is normal pulmonary artery systolic pressure. The estimated right ventricular systolic pressure is [id number] mmHg.  3. The mitral valve is grossly normal. Mild mitral valve regurgitation.  4. The aortic valve is tricuspid. Aortic valve regurgitation is not visualized. Mild aortic valve sclerosis is present, with no evidence of aortic valve stenosis.  5. The inferior vena cava is normal in size with greater than 50% respiratory variability, suggesting right atrial pressure of 3 mmHg. FINDINGS  Left Ventricle: Left ventricular ejection fraction, by estimation, is 55 to 60%. The left ventricle has normal function. The left ventricle has no regional wall motion abnormalities. The left ventricular internal cavity size was normal in size. There is  moderate left ventricular hypertrophy. Left ventricular diastolic parameters are consistent with Grade I diastolic dysfunction (impaired relaxation). Right Ventricle: The right ventricular size is normal. No increase in right ventricular wall thickness. Right ventricular systolic function is normal. There is normal pulmonary artery systolic pressure. The tricuspid regurgitant velocity is 2.59 m/s, and  with an assumed right atrial pressure of 3 mmHg, the estimated right ventricular systolic pressure is [id number] mmHg. Left Atrium: Left atrial size was normal in size. Right Atrium: Right atrial size was normal in size. Pericardium: There is no  evidence of pericardial effusion. Mitral Valve: The mitral valve is grossly normal. Mild mitral annular calcification. Mild mitral valve regurgitation. MV peak gradient, 4.6 mmHg. The mean mitral valve gradient is 1.0 mmHg. Tricuspid Valve: The tricuspid valve is grossly normal. Tricuspid valve regurgitation is trivial. Aortic Valve: The aortic valve is tricuspid. Aortic valve regurgitation is not visualized. Mild aortic valve sclerosis is present, with no evidence of aortic valve stenosis. Mild aortic valve annular calcification. Aortic valve mean gradient measures 2.5  mmHg. Aortic valve peak gradient measures 6.4 mmHg. Aortic valve area, by VTI measures 2.76 cm. Pulmonic Valve: The pulmonic valve was not well visualized. Pulmonic valve regurgitation is trivial. Aorta: The aortic root is normal in size and structure. Venous: The inferior vena cava is normal in size with greater than 50% respiratory variability, suggesting right atrial pressure of 3 mmHg. IAS/Shunts: No atrial level shunt detected by color flow Doppler.  LEFT VENTRICLE PLAX 2D LVIDd:         4.30 cm  Diastology LVIDs:         3.30 cm  LV e' lateral:   8.59 cm/s LV PW:         1.50 cm  LV E/e' lateral:  2.0 LV IVS:        1.50 cm  LV e' medial:    6.31 cm/s LVOT diam:     2.10 cm  LV E/e' medial:  2.8 LV SV:         51 LV SV Index:   25 LVOT Area:     3.46 cm  RIGHT VENTRICLE          IVC RV Basal diam:  2.50 cm  IVC diam: 1.00 cm TAPSE (M-mode): 2.8 cm LEFT ATRIUM             Index       RIGHT ATRIUM           Index LA Vol (A2C):   45.0 ml 22.30 ml/m RA Area:     12.10 cm LA Vol (A4C):   30.2 ml 14.97 ml/m RA Volume:   22.00 ml  10.90 ml/m LA Biplane Vol: 39.9 ml 19.77 ml/m  AORTIC VALVE AV Area (Vmax):    2.61 cm AV Area (Vmean):   2.71 cm AV Area (VTI):     2.76 cm AV Vmax:           126.50 cm/s AV Vmean:          77.000 cm/s AV VTI:            0.184 m AV Peak Grad:      6.4 mmHg AV Mean Grad:      2.5 mmHg LVOT Vmax:         95.30 cm/s  LVOT Vmean:        60.200 cm/s LVOT VTI:          0.146 m LVOT/AV VTI ratio: 0.80  AORTA Ao Root diam: 3.50 cm MITRAL VALVE               TRICUSPID VALVE MV Area (PHT): 13.55 cm   TR Peak grad:   26.8 mmHg MV Peak grad:  4.6 mmHg    TR Vmax:        259.00 cm/s MV Mean grad:  1.0 mmHg MV Vmax:       1.07 m/s    SHUNTS MV Vmean:      50.7 cm/s   Systemic VTI:  0.15 m MV Decel Time: 56 msec     Systemic Diam: 2.10 cm MV E velocity: 17.60 cm/s MV A velocity: 70.30 cm/s MV E/A ratio:  0.25 Rozann Lesches MD Electronically signed by Rozann Lesches MD Signature Date/Time: 08/10/2019/11:07:11 AM    Final     Microbiology: Recent Results (from the past 240 hour(s))  SARS Coronavirus 2 by RT PCR (hospital order, performed in [place] hospital lab) Nasopharyngeal Nasopharyngeal Swab     Status: None   Collection Time: 08/09/19  5:15 PM   Specimen: Nasopharyngeal Swab  Result Value Ref Range Status   SARS Coronavirus 2 NEGATIVE NEGATIVE Final    Comment: (NOTE) SARS-CoV-2 target nucleic acids are NOT DETECTED. The SARS-CoV-2 RNA is generally detectable in upper and lower respiratory specimens during the acute phase of infection. The lowest concentration of SARS-CoV-2 viral copies this assay can detect is 250 copies / mL. A negative result does not preclude SARS-CoV-2 infection and should not be used as the sole basis for treatment or other patient management decisions.  A negative result may occur with improper specimen collection / handling, submission of specimen other than nasopharyngeal swab, presence of viral mutation(s) within the areas targeted by this assay, and inadequate number  of viral copies (<250 copies / mL). A negative result must be combined with clinical observations, patient history, and epidemiological information. Fact Sheet for Patients:   StrictlyIdeas.no Fact Sheet for Healthcare Providers: BankingDealers.co.za This test is not  yet approved or cleared  by the [place] FDA and has been authorized for detection and/or diagnosis of SARS-CoV-2 by FDA under an Emergency Use Authorization (EUA).  This EUA will remain in effect (meaning this test can be used) for the duration of the COVID-19 declaration under Section 564(b)(1) of the Act, 21 U.S.C. section 360bbb-3(b)(1), unless the authorization is terminated or revoked sooner. Performed at [facility], [place] [address] [place], [place] [address]      Labs: Basic Metabolic Panel: Recent Labs  Lab 08/09/19 1250 08/10/19 0443 08/11/19 0328  NA 140 141 139  K 4.2 3.9 3.8  CL 105 106 105  CO2 28 27 26   GLUCOSE 117* 104* 101*  BUN 9 8 14   CREATININE 0.99 0.85 0.99  CALCIUM 9.2 9.1 9.2   Liver Function Tests: Recent Labs  Lab 08/09/19 1250  AST 18  ALT 13  ALKPHOS 80  BILITOT 0.5  PROT 7.1  ALBUMIN 4.0   No results for input(s): LIPASE, AMYLASE in the last 168 hours. Recent Labs  Lab 08/09/19 2119  AMMONIA 25   CBC: Recent Labs  Lab 08/09/19 1250 08/10/19 0443 08/11/19 0328  WBC 4.3 4.0 4.5  NEUTROABS  --  1.9 2.4  HGB 14.7 14.1 14.0  HCT 45.9 42.0 42.4  MCV 99.6 98.4 97.7  PLT 195 194 179   Cardiac Enzymes: No results for input(s): CKTOTAL, CKMB, CKMBINDEX, TROPONINI in the last 168 hours. BNP: BNP (last 3 results) No results for input(s): BNP in the last 8760 hours.  ProBNP (last 3 results) No results for input(s): PROBNP in the last 8760 hours.  CBG: Recent Labs  Lab 08/09/19 1248  GLUCAP 95       Signed:  Kayleen Memos, MD [facility] 08/11/2019, 5:42 PM

## 2019-08-11 NOTE — Progress Notes (Signed)
Occupational Therapy Treatment Patient Details Name: [name] MRN: [id number] DOB: [birth year] Today's Date: 08/11/2019    History of present illness This 85 y.o. male admitted for difficulties with speech.  MRI of brain showed acute Lt posterior frontal CVA.  PMH includes:  PE, sz disorder, DM, HTN, prostate CA, h/o CVA    OT comments  Pt making steady progress towards OT goals this session. Session focus on functional mobility with RW and higher level cognitive tasks. Pt with improved balance and overall safety awareness with RW. Pt required increased time to complete multitasking during mobility often stopping to complete task before progressing. Pt able to recall 2/3 words stated at start of session. Education provided for compensatory methods for recalling pertinent information d/t STM deficits. DC plan remains appropriate, will follow acutely per POC.    Follow Up Recommendations  Outpatient OT;Supervision - Intermittent(direct supervision with money, financial management, cooking)    Equipment Recommendations  None recommended by OT    Recommendations for Other Services      Precautions / Restrictions Precautions Precautions: Fall Precaution Comments: watch HR and BP Restrictions Weight Bearing Restrictions: No       Mobility Bed Mobility               General bed mobility comments: OOB in recliner  Transfers Overall transfer level: Needs assistance Equipment used: Rolling walker (2 wheeled) Transfers: Sit to/from Stand Sit to Stand: Supervision         General transfer comment: supervision for safety    Balance Overall balance assessment: Needs assistance Sitting-balance support: No upper extremity supported Sitting balance-Leahy Scale: Normal     Standing balance support: During functional activity;No upper extremity supported Standing balance-Leahy Scale: Good Standing balance comment: able to reach out of BOS to retrieve mask on window with no LOB                            ADL either performed or assessed with clinical judgement   ADL Overall ADL's : Needs assistance/impaired                         Toilet Transfer: Supervision/safety;Ambulation;RW Toilet Transfer Details (indicate cue type and reason): simulated via functional mobility with RW and gross supervision         Functional mobility during ADLs: Supervision/safety;Rolling walker General ADL Comments: pt agreeable to use RW this session noting improved balance and safety awareness. Pt complete community distance functional mobility with RW and gross supervision. Pt continues to present with cognitive impairments noted by difficulty multitasking and recalling words stated at start of session.     Vision       Perception     Praxis      Cognition Arousal/Alertness: Awake/alert Behavior During Therapy: WFL for tasks assessed/performed Overall Cognitive Status: No family/caregiver present to determine baseline cognitive functioning Area of Impairment: Attention;Memory;Following commands;Problem solving                   Current Attention Level: Sustained;Selective Memory: Decreased short-term memory Following Commands: Follows one step commands inconsistently;Follows multi-step commands consistently     Problem Solving: Requires verbal cues;Slow processing General Comments: pt with improved insight into deficits this session agreeable to use RW. pt able to recall 2/3 words stated at start of session and but requires increased time with multitasking. Instructed pt to state animals in relation to letters of the alphabet with  pt skipping 2 letters and needing to stand still while coming up with animals        Exercises     Shoulder Instructions       General Comments took BP post ambulation with BP 174/102 with RN approving mobility. BP post ambulation 191/105- RN aware and coming to recheck    Pertinent Vitals/ Pain       Pain  Assessment: No/denies pain  Home Living                                          Prior Functioning/Environment              Frequency  Min 2X/week        Progress Toward Goals  OT Goals(current goals can now be found in the care plan section)  Progress towards OT goals: Progressing toward goals  Acute Rehab OT Goals Patient Stated Goal: to go home  OT Goal Formulation: With patient Time For Goal Achievement: 08/24/19 Potential to Achieve Goals: Good  Plan Discharge plan remains appropriate    Co-evaluation                 AM-PAC OT "6 Clicks" Daily Activity     Outcome Measure   Help from another person eating meals?: None Help from another person taking care of personal grooming?: A Little Help from another person toileting, which includes using toliet, bedpan, or urinal?: A Little Help from another person bathing (including washing, rinsing, drying)?: A Little Help from another person to put on and taking off regular upper body clothing?: None Help from another person to put on and taking off regular lower body clothing?: None 6 Click Score: 21    End of Session Equipment Utilized During Treatment: Gait belt;Rolling walker  OT Visit Diagnosis: Unsteadiness on feet (R26.81);Cognitive communication deficit (R41.841) Symptoms and signs involving cognitive functions: Cerebral infarction   Activity Tolerance Patient tolerated treatment well   Patient Left in chair;with call bell/phone within reach;with chair alarm set   Nurse Communication Mobility status;Other (comment)(hypertension)        Time: [id number] OT Time Calculation (min): 19 min  Charges: OT General Charges $OT Visit: 1 Visit OT Treatments $Therapeutic Activity: 8-22 mins  Lanier Clam., COTA/L Acute Rehabilitation Services [phone] [place] 08/11/2019, 4:16 PM

## 2019-08-11 NOTE — TOC Benefit Eligibility Note (Signed)
Transition of Care [facility]) Benefit Eligibility Note    Patient Details  Name: [name] MRN: [id number] Date of Birth: 02-20-1935   Medication/Dose: 1. Pradaxa 75mg  bid (brand name non-formulary)              2. eliquis 5mg  bid (brand name non-formulary)                3. warfarin     Spoke with Person/Company/Phone Number:: [employer] [phone]  Co-Pay: 1. $491.32 for 30 day retail/                 2. $513.60 for 30 day retail                        3.$14.50 for 30 day retail   Additional Notes: most brand name medications on his plan are non formulary and have an extremely high co-pay. Generics are covered at a lower cost for the patient.    [place] Phone Number: 08/11/2019, 10:10 AM

## 2019-08-11 NOTE — Progress Notes (Signed)
STROKE TEAM PROGRESS NOTE   INTERVAL HISTORY Patient sitting in chair, no complaints, speech improved some from yesterday, but still not back to baseline.  Checked his insurance, not able to afford changing medication from edoxaban to Pradaxa or Eliquis.  Not feel ready for Coumadin, will continue in endoxaban.  Medication compliance education provided.  OBJECTIVE Vitals:   08/10/19 2332 08/11/19 0336 08/11/19 0804 08/11/19 1104  BP: (!) 148/94 (!) 141/96 (!) 167/102 (!) 151/94  Pulse: 70 76 100 77  Resp: 16 19 20 20   Temp: 98 F (36.7 C) 98.2 F (36.8 C) 98.8 F (37.1 C) 98.4 F (36.9 C)  TempSrc: Oral Oral Oral Oral  SpO2: 96%  99% 97%  Weight:      Height:       CBC:  Recent Labs  Lab 08/10/19 0443 08/11/19 0328  WBC 4.0 4.5  NEUTROABS 1.9 2.4  HGB 14.1 14.0  HCT 42.0 42.4  MCV 98.4 97.7  PLT 194 0000000   Basic Metabolic Panel:  Recent Labs  Lab 08/10/19 0443 08/11/19 0328  NA 141 139  K 3.9 3.8  CL 106 105  CO2 27 26  GLUCOSE 104* 101*  BUN 8 14  CREATININE 0.85 0.99  CALCIUM 9.1 9.2   Lipid Panel:     Component Value Date/Time   CHOL 152 08/10/2019 0443   TRIG 43 08/10/2019 0443   HDL 59 08/10/2019 0443   CHOLHDL 2.6 08/10/2019 0443   VLDL 9 08/10/2019 0443   LDLCALC 84 08/10/2019 0443   HgbA1c:  Lab Results  Component Value Date   HGBA1C 6.0 (H) 08/10/2019   Urine Drug Screen:     Component Value Date/Time   LABOPIA NONE DETECTED 08/09/2019 1716   COCAINSCRNUR NONE DETECTED 08/09/2019 1716   LABBENZ NONE DETECTED 08/09/2019 1716   AMPHETMU NONE DETECTED 08/09/2019 1716   THCU NONE DETECTED 08/09/2019 1716   LABBARB NONE DETECTED 08/09/2019 1716    Alcohol Level     Component Value Date/Time   ETH <10 08/09/2019 2119    IMAGING  DG Chest 2 View 08/09/2019 IMPRESSION:  No acute chest findings.   CT HEAD WO CONTRAST 08/09/2019 IMPRESSION:  1.  No acute intracranial findings.  2.  Chronic microvascular ischemic change and cerebral  volume loss.   MR Brain Wo Contrast (neuro protocol) 08/09/2019 IMPRESSION:  1. Small acute infarct in the posterior left frontal lobe.  2. Moderate chronic small vessel ischemic disease.  MRA Head 08/10/2019 Negative intracranial MR angiography. No change appreciated since the CT angiography of 07/22/2019. No stenosis, large or medium vessel occlusion or MR evidence of widespread intracranial atherosclerotic change.  CTA Head and Neck 07/22/2019 IMPRESSION: 1. No acute intracranial abnormality. Atrophy and chronic microvascular ischemic change in the white matter. 2. No significant carotid or vertebral artery stenosis in the neck. Mild atherosclerotic disease in both carotid arteries and right vertebral artery. 3. No intracranial large vessel occlusion or flow limiting stenosis.  Transthoracic Echocardiogram  1. Left ventricular ejection fraction, by estimation, is 55 to 60%. The left ventricle has normal function. The left ventricle has no regional wall motion abnormalities. There is moderate left ventricular hypertrophy. Left ventricular diastolic parameters are consistent with Grade I diastolic dysfunction (impaired relaxation).  2. Right ventricular systolic function is normal. The right ventricular size is normal. There is normal pulmonary artery systolic pressure. The estimated right ventricular systolic pressure is [id number] mmHg.  3. The mitral valve is grossly normal. Mild mitral valve regurgitation.  4. The aortic valve is tricuspid. Aortic valve regurgitation is not visualized. Mild aortic valve sclerosis is present, with no evidence of aortic valve stenosis.  5. The inferior vena cava is normal in size with greater than 50% respiratory variability, suggesting right atrial pressure of 3 mmHg.   ECG - SR rate 76 BPM. (See cardiology reading for complete details)   PHYSICAL EXAM  Temp:  [98 F (36.7 C)-98.8 F (37.1 C)] 98.4 F (36.9 C) (05/17 1104) Pulse Rate:  [70-100]  77 (05/17 1104) Resp:  [16-20] 20 (05/17 1104) BP: (139-167)/(89-102) 151/94 (05/17 1104) SpO2:  [96 %-99 %] 97 % (05/17 1104)  General - Well nourished, well developed, in no apparent distress.  Ophthalmologic - fundi not visualized due to noncooperation.  Cardiovascular - Regular rhythm and rate.  Mental Status -  Level of arousal and orientation to time, place, and person were intact. Language including expression, naming, repetition, comprehension was assessed and found intact except occasional word finding difficulty. Fund of Knowledge was assessed and was intact.  Cranial Nerves II - XII - II - Visual field intact OU. III, IV, VI - Extraocular movements intact. V - Facial sensation intact bilaterally. VII - Facial movement intact bilaterally. VIII - Hearing & vestibular intact bilaterally. X - Palate elevates symmetrically. XI - Chin turning & shoulder shrug intact bilaterally. XII - Tongue protrusion intact.  Motor Strength - The patient's strength was normal in all extremities and pronator drift was absent.  Bulk was normal and fasciculations were absent.   Motor Tone - Muscle tone was assessed at the neck and appendages and was normal.  Reflexes - The patient's reflexes were symmetrical in all extremities and he had no pathological reflexes.  Sensory - Light touch, temperature/pinprick were assessed and were symmetrical.    Coordination - The patient had normal movements in the hands and feet with no ataxia or dysmetria.  Tremor was absent.  Gait and Station - deferred.   ASSESSMENT/PLAN Mr. Brent Ptak is a 85 y.o. 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 The pt does not believe this was a seizure has he did not experience his typical post ictal period. He did not receive IV t-PA due to late presentation (>4.5 hours from time of onset).  Stroke: Small acute infarct in the posterior left frontal lobe - embolic - source unclear.   CT head - No acute intracranial findings.   MRI head - Small acute infarct in the posterior left frontal lobe. Moderate chronic small vessel ischemic disease.  MRA head - neg  CTA H&N - 07/22/19 - Mild atherosclerotic disease in both carotid arteries and right vertebral artery. No intracranial large vessel occlusion or flow limiting stenosis.   2D Echo EF 55-60%  [employer] Virus 2 - negative  LDL - 84  HgbA1c - 6.0  UDS - negative  VTE prophylaxis - IV Heparin  aspirin 81 mg daily and Edoxaban prior to admission, now on endoxaban 60mg  daily.  Currently not able to  change to any other DOACs due to insurance coverage  Patient will be counseled to be compliant with his antithrombotic medications  Ongoing aggressive stroke risk factor management  Therapy recommendations:  OP PT/OT  Disposition:  Pending  ??  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  It could be small vessel event versus peripheral VI nerve palsy due to diabetes.  Tachycardia  History of tachycardia and possible syncope  Loop recorder placed in 2015, and out of battery 2019.  Undefined a few episode of atrial tachycardia, most time sinus tachycardia although cannot rule out brief ectopic atrial tachycardia.  Continued on endoxaban  Pt stated compliance with endoxaban and ASA 81  improved  History of saddle PE  Has been on endoxaban 60mg  daily since  Patient pays $4 per month  Continue endoxaban on discharge  Hypertension  Home BP meds: Zestril  Current BP meds: norvasc 5 . Blood pressure somewhat high at times . Long-term BP  goal normotensive  Hyperlipidemia  Home Lipid lowering medication: Pravachol 80 mg daily  LDL 84, goal < 70  Current lipid lowering medication: Lipitor 80 mg daily  Continue statin at discharge  Diabetes  Home diabetic meds: none   HgbA1c 6.0, goal < 7.0  SSI  CBG monitoring  Close PCP follow-up  Other Stroke Risk Factors  Advanced age  Former cigarette smoker - quit  Other Active Problems  Code status - Full code  Seizure history - on [facility] day # 1  Neurology will sign off. Please call with questions. Pt will follow up with Dr. Krista Blue at [facility] in about 4 weeks. Thanks for the consult.   Rosalin Hawking, MD PhD Stroke Neurology 08/11/2019 2:23 PM   To contact Stroke Continuity provider, please refer to [email or web address] After hours, contact General Neurology

## 2019-08-13 LAB — LEVETIRACETAM LEVEL: Levetiracetam Lvl: 61.7 ug/mL — ABNORMAL HIGH (ref 10.0–40.0)

## 2019-08-19 DIAGNOSIS — I10 Essential (primary) hypertension: Secondary | ICD-10-CM | POA: Diagnosis not present

## 2019-08-19 DIAGNOSIS — E7800 Pure hypercholesterolemia, unspecified: Secondary | ICD-10-CM | POA: Diagnosis not present

## 2019-08-19 DIAGNOSIS — Z8673 Personal history of transient ischemic attack (TIA), and cerebral infarction without residual deficits: Secondary | ICD-10-CM | POA: Diagnosis not present

## 2019-08-19 DIAGNOSIS — D509 Iron deficiency anemia, unspecified: Secondary | ICD-10-CM | POA: Diagnosis not present

## 2019-08-19 DIAGNOSIS — I2692 Saddle embolus of pulmonary artery without acute cor pulmonale: Secondary | ICD-10-CM | POA: Diagnosis not present

## 2019-08-19 DIAGNOSIS — Z79899 Other long term (current) drug therapy: Secondary | ICD-10-CM | POA: Diagnosis not present

## 2019-10-02 ENCOUNTER — Inpatient Hospital Stay: Payer: Medicare Other | Admitting: Neurology

## 2019-10-02 ENCOUNTER — Telehealth: Payer: Self-pay | Admitting: *Deleted

## 2019-10-02 ENCOUNTER — Encounter: Payer: Self-pay | Admitting: Neurology

## 2019-10-02 NOTE — Telephone Encounter (Signed)
No showed consult appointment.

## 2019-11-06 DIAGNOSIS — Z9181 History of falling: Secondary | ICD-10-CM | POA: Diagnosis not present

## 2019-11-06 DIAGNOSIS — E785 Hyperlipidemia, unspecified: Secondary | ICD-10-CM | POA: Diagnosis not present

## 2019-11-06 DIAGNOSIS — Z0000 Encounter for general adult medical examination without abnormal findings: Secondary | ICD-10-CM | POA: Diagnosis not present

## 2019-11-06 DIAGNOSIS — Z1331 Encounter for screening for depression: Secondary | ICD-10-CM | POA: Diagnosis not present

## 2019-11-19 DIAGNOSIS — Z8673 Personal history of transient ischemic attack (TIA), and cerebral infarction without residual deficits: Secondary | ICD-10-CM | POA: Diagnosis not present

## 2019-11-19 DIAGNOSIS — G40909 Epilepsy, unspecified, not intractable, without status epilepticus: Secondary | ICD-10-CM | POA: Diagnosis not present

## 2019-11-19 DIAGNOSIS — I2692 Saddle embolus of pulmonary artery without acute cor pulmonale: Secondary | ICD-10-CM | POA: Diagnosis not present

## 2019-11-19 DIAGNOSIS — Z6825 Body mass index (BMI) 25.0-25.9, adult: Secondary | ICD-10-CM | POA: Diagnosis not present

## 2019-11-19 DIAGNOSIS — I10 Essential (primary) hypertension: Secondary | ICD-10-CM | POA: Diagnosis not present

## 2019-11-19 DIAGNOSIS — E7800 Pure hypercholesterolemia, unspecified: Secondary | ICD-10-CM | POA: Diagnosis not present

## 2019-11-19 DIAGNOSIS — D509 Iron deficiency anemia, unspecified: Secondary | ICD-10-CM | POA: Diagnosis not present

## 2019-11-19 DIAGNOSIS — E119 Type 2 diabetes mellitus without complications: Secondary | ICD-10-CM | POA: Diagnosis not present

## 2019-12-04 ENCOUNTER — Ambulatory Visit: Payer: Medicare Other | Admitting: Neurology

## 2020-02-09 ENCOUNTER — Encounter: Payer: Self-pay | Admitting: Neurology

## 2020-02-09 ENCOUNTER — Ambulatory Visit: Payer: Medicare Other | Admitting: Neurology

## 2020-02-16 DIAGNOSIS — D509 Iron deficiency anemia, unspecified: Secondary | ICD-10-CM | POA: Diagnosis not present

## 2020-02-16 DIAGNOSIS — I10 Essential (primary) hypertension: Secondary | ICD-10-CM | POA: Diagnosis not present

## 2020-02-16 DIAGNOSIS — Z6827 Body mass index (BMI) 27.0-27.9, adult: Secondary | ICD-10-CM | POA: Diagnosis not present

## 2020-02-16 DIAGNOSIS — G40909 Epilepsy, unspecified, not intractable, without status epilepticus: Secondary | ICD-10-CM | POA: Diagnosis not present

## 2020-02-16 DIAGNOSIS — Z8673 Personal history of transient ischemic attack (TIA), and cerebral infarction without residual deficits: Secondary | ICD-10-CM | POA: Diagnosis not present

## 2020-02-16 DIAGNOSIS — E119 Type 2 diabetes mellitus without complications: Secondary | ICD-10-CM | POA: Diagnosis not present

## 2020-02-16 DIAGNOSIS — E7800 Pure hypercholesterolemia, unspecified: Secondary | ICD-10-CM | POA: Diagnosis not present

## 2020-02-16 DIAGNOSIS — I2692 Saddle embolus of pulmonary artery without acute cor pulmonale: Secondary | ICD-10-CM | POA: Diagnosis not present

## 2020-04-12 ENCOUNTER — Other Ambulatory Visit: Payer: Self-pay | Admitting: Neurology

## 2020-05-18 DIAGNOSIS — E119 Type 2 diabetes mellitus without complications: Secondary | ICD-10-CM | POA: Diagnosis not present

## 2020-05-18 DIAGNOSIS — G40909 Epilepsy, unspecified, not intractable, without status epilepticus: Secondary | ICD-10-CM | POA: Diagnosis not present

## 2020-05-18 DIAGNOSIS — E7800 Pure hypercholesterolemia, unspecified: Secondary | ICD-10-CM | POA: Diagnosis not present

## 2020-05-18 DIAGNOSIS — I10 Essential (primary) hypertension: Secondary | ICD-10-CM | POA: Diagnosis not present

## 2020-05-18 DIAGNOSIS — Z8673 Personal history of transient ischemic attack (TIA), and cerebral infarction without residual deficits: Secondary | ICD-10-CM | POA: Diagnosis not present

## 2020-05-18 DIAGNOSIS — I2692 Saddle embolus of pulmonary artery without acute cor pulmonale: Secondary | ICD-10-CM | POA: Diagnosis not present

## 2020-05-18 DIAGNOSIS — D509 Iron deficiency anemia, unspecified: Secondary | ICD-10-CM | POA: Diagnosis not present

## 2020-05-18 DIAGNOSIS — Z6827 Body mass index (BMI) 27.0-27.9, adult: Secondary | ICD-10-CM | POA: Diagnosis not present

## 2020-08-13 IMAGING — CT CT ANGIO HEAD
2 of 11 series · 4 of 33 positions shown · IV contrast (iopamidol)
Comparison: MRI head 06/23/2019

CLINICAL DATA: Ataxia, suspect stroke.  History of prostate cancer.

EXAM:
CT ANGIOGRAPHY HEAD AND NECK
TECHNIQUE: Multidetector CT imaging of the head and neck was performed using
the standard protocol during bolus administration of intravenous
contrast. Multiplanar CT image reconstructions and MIPs were
obtained to evaluate the vascular anatomy. Carotid stenosis
measurements (when applicable) are obtained utilizing NASCET
criteria, using the distal internal carotid diameter as the
denominator.
CONTRAST:  100mL A2FER0-JQQ IOPAMIDOL (A2FER0-JQQ) INJECTION 61%

[Series 12: brain 3.00 hr40 s3 sag without ibhc · sagittal · non-contrast · 0.33mm/px · 1 of 57 slices shown]
[im 29/57  soft-tissue]
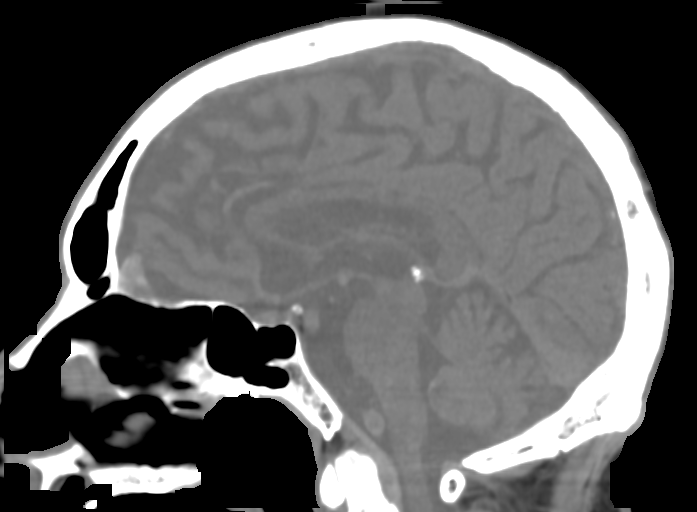

[Series 15: cta head & neck 1.00 hv48 s3 ax thin mips · axial · 0.49mm/px · z∈[-920,-551]mm · 3 of 370 slices shown]
[im 1/370  soft-tissue]
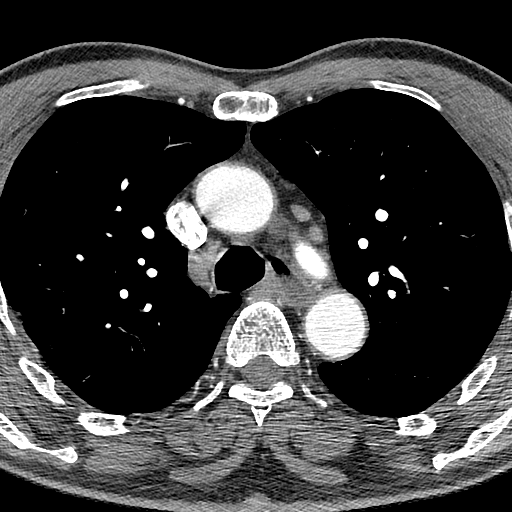
[im 185/370  bone]
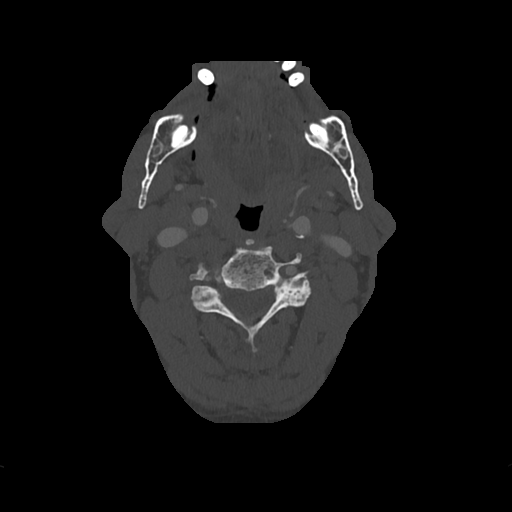
[im 370/370  soft-tissue]
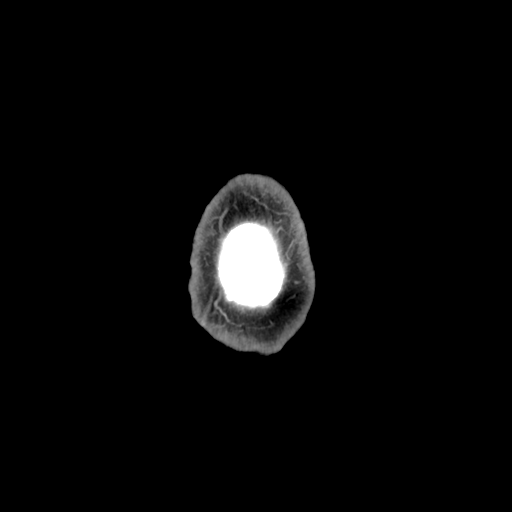

[4 of 33 positions shown; findings below may reference images not displayed]

FINDINGS: CT HEAD FINDINGS

Brain: Generalized mild to moderate atrophy. Negative for
hydrocephalus. Patchy white matter hypodensity bilaterally is
chronic. No acute infarct, hemorrhage, mass.

Vascular: Negative for hyperdense vessel

Skull: Negative

Sinuses: Negative

Orbits: Right cataract extraction.  No orbital lesion.

Review of the MIP images confirms the above findings

CTA NECK FINDINGS

Aortic arch: Standard branching. Imaged portion shows no evidence of
aneurysm or dissection. No significant stenosis of the major arch
vessel origins. Atherosclerotic calcification aortic arch.

Right carotid system: Mild atherosclerotic calcification right
common carotid artery and right carotid bifurcation without
significant stenosis.

Left carotid system: Mild atherosclerotic calcification left common
carotid artery and left carotid bifurcation without significant
stenosis.

Vertebral arteries: Left vertebral artery dominant and widely
patent. Small right vertebral artery with scattered atherosclerotic
disease.

Skeleton: Cervical spondylosis and kyphosis. No acute skeletal
abnormality.

Other neck: Negative

Upper chest: No acute abnormality.

Review of the MIP images confirms the above findings

CTA HEAD FINDINGS

Anterior circulation: Mild atherosclerotic calcification in the
cavernous carotid bilaterally without stenosis. Anterior and middle
cerebral arteries patent bilaterally without significant stenosis.

Posterior circulation: Right vertebral artery is small and ends in
PICA. Left PICA is patent. Basilar artery patent. AICA, superior
cerebellar, and posterior cerebral arteries patent bilaterally
without stenosis.

Venous sinuses: Normal venous enhancement

Anatomic variants: None

Review of the MIP images confirms the above findings
IMPRESSION: 1. No acute intracranial abnormality. Atrophy and chronic
microvascular ischemic change in the white matter.
2. No significant carotid or vertebral artery stenosis in the neck.
Mild atherosclerotic disease in both carotid arteries and right
vertebral artery.
3. No intracranial large vessel occlusion or flow limiting stenosis.

## 2020-08-17 DIAGNOSIS — Z6827 Body mass index (BMI) 27.0-27.9, adult: Secondary | ICD-10-CM | POA: Diagnosis not present

## 2020-08-17 DIAGNOSIS — E119 Type 2 diabetes mellitus without complications: Secondary | ICD-10-CM | POA: Diagnosis not present

## 2020-08-17 DIAGNOSIS — D509 Iron deficiency anemia, unspecified: Secondary | ICD-10-CM | POA: Diagnosis not present

## 2020-08-17 DIAGNOSIS — Z8673 Personal history of transient ischemic attack (TIA), and cerebral infarction without residual deficits: Secondary | ICD-10-CM | POA: Diagnosis not present

## 2020-08-17 DIAGNOSIS — I2692 Saddle embolus of pulmonary artery without acute cor pulmonale: Secondary | ICD-10-CM | POA: Diagnosis not present

## 2020-08-17 DIAGNOSIS — G40909 Epilepsy, unspecified, not intractable, without status epilepticus: Secondary | ICD-10-CM | POA: Diagnosis not present

## 2020-08-17 DIAGNOSIS — I10 Essential (primary) hypertension: Secondary | ICD-10-CM | POA: Diagnosis not present

## 2020-08-17 DIAGNOSIS — E7800 Pure hypercholesterolemia, unspecified: Secondary | ICD-10-CM | POA: Diagnosis not present

## 2020-11-08 DIAGNOSIS — Z1331 Encounter for screening for depression: Secondary | ICD-10-CM | POA: Diagnosis not present

## 2020-11-08 DIAGNOSIS — Z0000 Encounter for general adult medical examination without abnormal findings: Secondary | ICD-10-CM | POA: Diagnosis not present

## 2020-11-08 DIAGNOSIS — E785 Hyperlipidemia, unspecified: Secondary | ICD-10-CM | POA: Diagnosis not present

## 2020-11-08 DIAGNOSIS — Z9181 History of falling: Secondary | ICD-10-CM | POA: Diagnosis not present

## 2020-11-08 DIAGNOSIS — Z139 Encounter for screening, unspecified: Secondary | ICD-10-CM | POA: Diagnosis not present

## 2020-11-17 DIAGNOSIS — I10 Essential (primary) hypertension: Secondary | ICD-10-CM | POA: Diagnosis not present

## 2020-11-17 DIAGNOSIS — Z8673 Personal history of transient ischemic attack (TIA), and cerebral infarction without residual deficits: Secondary | ICD-10-CM | POA: Diagnosis not present

## 2020-11-17 DIAGNOSIS — I700 Atherosclerosis of aorta: Secondary | ICD-10-CM | POA: Diagnosis not present

## 2020-11-17 DIAGNOSIS — G40909 Epilepsy, unspecified, not intractable, without status epilepticus: Secondary | ICD-10-CM | POA: Diagnosis not present

## 2020-11-17 DIAGNOSIS — I2692 Saddle embolus of pulmonary artery without acute cor pulmonale: Secondary | ICD-10-CM | POA: Diagnosis not present

## 2020-11-17 DIAGNOSIS — E119 Type 2 diabetes mellitus without complications: Secondary | ICD-10-CM | POA: Diagnosis not present

## 2020-11-17 DIAGNOSIS — D509 Iron deficiency anemia, unspecified: Secondary | ICD-10-CM | POA: Diagnosis not present

## 2020-11-17 DIAGNOSIS — E7800 Pure hypercholesterolemia, unspecified: Secondary | ICD-10-CM | POA: Diagnosis not present

## 2020-11-17 DIAGNOSIS — Z6827 Body mass index (BMI) 27.0-27.9, adult: Secondary | ICD-10-CM | POA: Diagnosis not present

## 2021-02-22 DIAGNOSIS — E7800 Pure hypercholesterolemia, unspecified: Secondary | ICD-10-CM | POA: Diagnosis not present

## 2021-02-22 DIAGNOSIS — I10 Essential (primary) hypertension: Secondary | ICD-10-CM | POA: Diagnosis not present

## 2021-02-22 DIAGNOSIS — Z86711 Personal history of pulmonary embolism: Secondary | ICD-10-CM | POA: Diagnosis not present

## 2021-02-22 DIAGNOSIS — Z6828 Body mass index (BMI) 28.0-28.9, adult: Secondary | ICD-10-CM | POA: Diagnosis not present

## 2021-02-22 DIAGNOSIS — E119 Type 2 diabetes mellitus without complications: Secondary | ICD-10-CM | POA: Diagnosis not present

## 2021-02-22 DIAGNOSIS — G40909 Epilepsy, unspecified, not intractable, without status epilepticus: Secondary | ICD-10-CM | POA: Diagnosis not present

## 2021-02-22 DIAGNOSIS — Z8673 Personal history of transient ischemic attack (TIA), and cerebral infarction without residual deficits: Secondary | ICD-10-CM | POA: Diagnosis not present

## 2021-03-04 DIAGNOSIS — R791 Abnormal coagulation profile: Secondary | ICD-10-CM | POA: Diagnosis not present

## 2021-03-11 DIAGNOSIS — Z7901 Long term (current) use of anticoagulants: Secondary | ICD-10-CM | POA: Diagnosis not present

## 2021-04-25 ENCOUNTER — Other Ambulatory Visit: Payer: Self-pay | Admitting: Neurology

## 2021-04-27 ENCOUNTER — Telehealth: Payer: Self-pay | Admitting: Neurology

## 2021-04-27 NOTE — Telephone Encounter (Signed)
Pt is needing a refill request on his levETIRAcetam (KEPPRA) 750 MG tablet sent to the [facility] on [address]

## 2021-04-28 ENCOUNTER — Other Ambulatory Visit: Payer: Self-pay

## 2021-04-28 NOTE — Telephone Encounter (Signed)
Rx refilled. Note to pharmacy: Patient must have pending appointment for further refills.

## 2021-04-28 NOTE — Telephone Encounter (Signed)
Rx refilled. Note to pharmacy: Patient must have pending appointment for further refills!

## 2021-06-06 DIAGNOSIS — R791 Abnormal coagulation profile: Secondary | ICD-10-CM | POA: Diagnosis not present

## 2021-06-16 ENCOUNTER — Encounter: Payer: Self-pay | Admitting: Neurology

## 2021-06-16 ENCOUNTER — Ambulatory Visit: Payer: Medicare Other | Admitting: Neurology

## 2021-06-16 VITALS — BP 191/91 | HR 120 | Ht 72.0 in | Wt 185.0 lb

## 2021-06-16 DIAGNOSIS — G40301 Generalized idiopathic epilepsy and epileptic syndromes, not intractable, with status epilepticus: Secondary | ICD-10-CM

## 2021-06-16 DIAGNOSIS — I639 Cerebral infarction, unspecified: Secondary | ICD-10-CM | POA: Diagnosis not present

## 2021-06-16 DIAGNOSIS — R269 Unspecified abnormalities of gait and mobility: Secondary | ICD-10-CM | POA: Diagnosis not present

## 2021-06-16 MED ORDER — LEVETIRACETAM 750 MG PO TABS: ORAL_TABLET | ORAL | 3 refills | Status: DC

## 2021-06-16 NOTE — Progress Notes (Signed)
? ? ?PATIENT: [name] ?DOB: 1935-02-05 ? ?Chief Complaint  ?Patient presents with  ? Follow-up  ?  Room 15, alone ?Pt is here for refills   ?  ? ?HISTORICAL ? ?[name] is a 87 years old man, seen in request by his primary care PA Cyndi Bender for evaluation of seizure, he was discharged from hospital on March 08, 2019 for recurrent seizure ? ?I saw him initially in June 2014, but has lost follow-up since then, ? ?He had a past medical history of hypertension, hyperlipidemia, diabetes, GERD multiple recurrent seizures, ?  ?He woke up one day in May 2014, chewed his tongue, achy all over, was told he had a seizure, he lives with his brother, who also suffered complex partial [name], [name] ( Feb 9th 1939) is a patient of ours ?  ?This is not his first episode, initial one was in 2009, he woke up on his way to hospital on the ambulance, he was found by his brother having a seizure, ?  ?He also had one minor episode in April 2014, he woke up with tongue biting, suspicious he might have a seizure as well, ?  ?He never had daytime passing out, No history of head trauma, or stroke, ?  ?MRI of the brain in December 2012 without contrast for evaluation of seizure-like activity, reported atrophy, small vessel disease, no contrast was given, ? ?EEG in 2012 was normal ? ?He had recurrent seizure on March 07, 2019 during his sleep, woke up at the hospital, home regimen was Dilantin 160 in the morning, 200 at night, Keppra 500 mg twice daily, ? ?Laboratory in December 2020, normal CBC, hemoglobin of 13.6, BMP, glucose of 112, Dilantin level was 12.7, UDS was negative ? ?I personally reviewed CT head without contrast December 2020, generalized atrophy, supratentorium small vessel disease. ? ?He is now taking Dilantin 30 mg +100 mg, Keppra 750 mg twice a day ? ?Echocardiogram in December 2020 showed ejection fraction 55 to 60%, bilateral atrial size were normal, possible density attached to the anterior mitral valve  leaflets, ? ?UPDATE June 26 2019: ?He is alone at today's clinical visit ?Since last visit on March 24, 2019, his Keppra dose was increased to 750 mg 2 tablets twice a day, he was weaned off Dilantin ? ?Repeat EEG on April 07, 2019 showed no significant abnormality ?He presented to emergency room on June 23, 2019 after having seizure at home, his brother noticed that he was behaving unusually, complains of a headache, followed by a generalized seizure, ? ?He was given 1 g of IV Keppra load, personally reviewed CT head without contrast  no acute abnormality, moderate atrophy, supratentorium small vessel disease ? ?Laboratory evaluation showed normal CBC, CMP, magnesium level, negative alcohol, UDS, Dilantin level was less than 2.5, Keppra level was 54.6, ? ?He was noted to have rapid irregular heart rate on today's examination, reviewed extensive cardiology record, he had loop recorder REveal Lin1 [id number] implant on March 26, 2014, reviewed remote device check from Jan 2016 to April 2019, tachycardia episode, fastest 176 bpm, he is supposed to follow-up with his cardiologist Dr.Croitorum ? ?He also complains of sudden onset double vision on June 04, 2019, was seen by ophthalmologist Dr. Melissa Noon, suspicious for right 6th nerve palsy, ? ?I personally reviewed MRI of the brain on June 23, 2019, no acute abnormality, moderate atrophy, supratentorium small vessel disease ? 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 ?He has no recurrent seizure tolerating Keppra 750 mg 2 in the morning 3 at night, but complains of worsening balance difficulties ? ?He has left eye abductor difficulty, but patient denies double vision, it was documented in May 2021 hospital admission as well. ? ?Personally reviewed CT angiogram  in April 2021, no large vessel disease ? ? ? ?REVIEW OF SYSTEMS: Full 14 system review of systems performed and notable only for as above ?All other review of systems were negative. ? ?ALLERGIES: ?No Known Allergies ? ?HOME MEDICATIONS: ?Current Outpatient Medications  ?Medication Sig Dispense Refill  ? aspirin EC 81 MG tablet Take 1 tablet (81 mg total) by mouth daily. 90 tablet 11  ? edoxaban (SAVAYSA) 60 MG TABS tablet Take 60 mg by mouth daily. 30 tablet 11  ? ferrous sulfate 325 (65 FE) MG EC tablet Take 1 tablet (325 mg total) by mouth 2 (two) times daily. 60 tablet 5  ? levETIRAcetam (KEPPRA) 750 MG tablet TAKE 2 TABLETS BY MOUTH EVERY DAY IN THE MORNING AND 3 TABS AT BEDTIME 150 tablet 3  ? amLODipine (NORVASC) 10 MG tablet Take 1 tablet (10 mg total) by mouth daily. 90 tablet 0  ? atorvastatin (LIPITOR) 80 MG tablet Take 1 tablet (80 mg total) by mouth daily. 90 tablet 0  ? ?No current facility-administered medications for this visit.  ? ? ?PAST MEDICAL HISTORY: ?Past Medical History:  ?Diagnosis Date  ? Anemia   ? Cancer [facility])   ? Prostate  ? Cervicalgia   ? Essential hypertension, benign   ? History of loop recorder 07/14/2014  ? Impotence of organic origin   ? Insomnia   ? Pulmonary embolism ([place])   ? Pure hypercholesterolemia   ? [place] pulmonary embolus ([facility])   ? Seizures ([place])   ? Type II or unspecified type diabetes mellitus without mention of complication, not stated as uncontrolled   ? ? ?PAST SURGICAL HISTORY: ?Past Surgical History:  ?Procedure Laterality Date  ? LOOP RECORDER IMPLANT N/A 03/26/2014  ? Procedure: LOOP RECORDER IMPLANT;  Surgeon: Sanda Klein, MD;  Location: [facility];  Service: Cardiovascular;  Laterality: N/A;  ? ? ?FAMILY HISTORY: ?Family History  ?Problem Relation Age of Onset  ? Cancer Mother   ? Cancer Father   ? ? ?SOCIAL HISTORY: ?Social History  ? ?Socioeconomic History  ? Marital status: Single  ?  Spouse name: Not on file  ? Number of children: Not on file  ? Years  of education: 3  ? Highest education level: Not on file  ?Occupational History  ? Not on file  ?Tobacco Use  ? Smoking status: Former  ? Smokeless tobacco: Never  ?Vaping Use  ? Vaping Use: Never used  ?Substance and Sexual Activity  ? Alcohol use: No  ? Drug use: No  ? Sexual activity: Not on file  ?Other Topics Concern  ? Not on file  ?Social History Narrative  ? He lives with his brother, retired, driving, no smoking, no drinking.  ? ?Social Determinants of Health  ? ?Financial Resource Strain: Not on file  ?Food Insecurity: Not on file  ?Transportation Needs: Not on file  ?Physical Activity: Not on file  ?Stress: Not on file  ?Social Connections: Not on file  ?Intimate Partner Violence: Not on file  ? ? ? ?PHYSICAL EXAM ?  ?Vitals:  ?  06/16/21 0733  ?BP: (!) 191/91  ?Pulse: (!) 120  ?Weight: 185 lb (83.9 kg)  ?Height: 6' (1.829 m)  ? ?Not recorded ?  ? ? ?Body mass index is 25.09 kg/m?. ? ?PHYSICAL EXAMNIATION: ? ?Gen: NAD, conversant, well nourised, well groomed                     ?Cardiovascular: Regular rate rhythm, no peripheral edema, warm, nontender. ?Eyes: Conjunctivae clear without exudates or hemorrhage ?Neck: Supple, no carotid bruits. ?Pulmonary: Clear to auscultation bilaterally  ? ?NEUROLOGICAL EXAM: ? ?MENTAL STATUS: ?Speech/cognition: ?Awake, alert, oriented to history taking care of conversation ?  ?CRANIAL NERVES: ?CN II: Visual fields are full to confrontation. Pupils are round equal and briskly reactive to light. ?CN III, IV, VI: Cover and uncover showed right superior/exophoria, left inferior/exophoria, left eye incomplete abduct ?CN V: Facial sensation is intact to light touch ?CN VII: Face is symmetric with normal eye closure  ?CN VIII: Hearing is normal to causal conversation. ?CN IX, X: Phonation is normal. ?CN XI: Head turning and shoulder shrug are intact ? ?MOTOR: Fixation of left arm upon rapid rotating movement, mild right ankle dorsiflexion weakness, mild to moderate bilateral  lower extremity spasticity ? ?REFLEXES: ?Reflexes are 1 and symmetric at the biceps, triceps, absent at knees, and ankles.  ? ?SENSORY: Length-dependent decreased light touch, pinprick, vibratory sensation

## 2021-06-20 DIAGNOSIS — Z7901 Long term (current) use of anticoagulants: Secondary | ICD-10-CM | POA: Diagnosis not present

## 2021-07-11 ENCOUNTER — Other Ambulatory Visit: Payer: Self-pay

## 2021-07-11 ENCOUNTER — Emergency Department (HOSPITAL_COMMUNITY)
Admission: EM | Admit: 2021-07-11 | Discharge: 2021-07-11 | Disposition: A | Discharge status: Home / Self Care | Payer: Medicare Other | Attending: Emergency Medicine | Admitting: Emergency Medicine

## 2021-07-11 ENCOUNTER — Emergency Department (HOSPITAL_COMMUNITY): Payer: Medicare Other

## 2021-07-11 ENCOUNTER — Encounter (HOSPITAL_COMMUNITY): Payer: Self-pay

## 2021-07-11 DIAGNOSIS — R6889 Other general symptoms and signs: Secondary | ICD-10-CM | POA: Diagnosis not present

## 2021-07-11 DIAGNOSIS — I10 Essential (primary) hypertension: Secondary | ICD-10-CM | POA: Diagnosis not present

## 2021-07-11 DIAGNOSIS — I639 Cerebral infarction, unspecified: Secondary | ICD-10-CM | POA: Diagnosis not present

## 2021-07-11 DIAGNOSIS — Z7901 Long term (current) use of anticoagulants: Secondary | ICD-10-CM | POA: Insufficient documentation

## 2021-07-11 DIAGNOSIS — Z79899 Other long term (current) drug therapy: Secondary | ICD-10-CM | POA: Insufficient documentation

## 2021-07-11 DIAGNOSIS — H5000 Unspecified esotropia: Secondary | ICD-10-CM | POA: Diagnosis not present

## 2021-07-11 DIAGNOSIS — R29810 Facial weakness: Secondary | ICD-10-CM

## 2021-07-11 DIAGNOSIS — G510 Bell's palsy: Secondary | ICD-10-CM | POA: Insufficient documentation

## 2021-07-11 DIAGNOSIS — Z7982 Long term (current) use of aspirin: Secondary | ICD-10-CM | POA: Diagnosis not present

## 2021-07-11 DIAGNOSIS — I6782 Cerebral ischemia: Secondary | ICD-10-CM | POA: Diagnosis not present

## 2021-07-11 DIAGNOSIS — R29818 Other symptoms and signs involving the nervous system: Secondary | ICD-10-CM | POA: Diagnosis not present

## 2021-07-11 DIAGNOSIS — Z743 Need for continuous supervision: Secondary | ICD-10-CM | POA: Diagnosis not present

## 2021-07-11 DIAGNOSIS — R4781 Slurred speech: Secondary | ICD-10-CM | POA: Diagnosis not present

## 2021-07-11 DIAGNOSIS — I499 Cardiac arrhythmia, unspecified: Secondary | ICD-10-CM | POA: Diagnosis not present

## 2021-07-11 LAB — CBC
HCT: 43.1 % (ref 39.0–52.0)
Hemoglobin: 14 g/dL (ref 13.0–17.0)
MCH: 32.2 pg (ref 26.0–34.0)
MCHC: 32.5 g/dL (ref 30.0–36.0)
MCV: 99.1 fL (ref 80.0–100.0)
Platelets: 189 10*3/uL (ref 150–400)
RBC: 4.35 MIL/uL (ref 4.22–5.81)
RDW: 12.6 % (ref 11.5–15.5)
WBC: 5.2 10*3/uL (ref 4.0–10.5)
nRBC: 0 % (ref 0.0–0.2)

## 2021-07-11 LAB — DIFFERENTIAL
Abs Immature Granulocytes: 0.01 10*3/uL (ref 0.00–0.07)
Basophils Absolute: 0 10*3/uL (ref 0.0–0.1)
Basophils Relative: 1 %
Eosinophils Absolute: 0.1 10*3/uL (ref 0.0–0.5)
Eosinophils Relative: 1 %
Immature Granulocytes: 0 %
Lymphocytes Relative: 32 %
Lymphs Abs: 1.7 10*3/uL (ref 0.7–4.0)
Monocytes Absolute: 0.5 10*3/uL (ref 0.1–1.0)
Monocytes Relative: 10 %
Neutro Abs: 2.9 10*3/uL (ref 1.7–7.7)
Neutrophils Relative %: 56 %

## 2021-07-11 LAB — COMPREHENSIVE METABOLIC PANEL
ALT: 12 U/L (ref 0–44)
AST: 19 U/L (ref 15–41)
Albumin: 4 g/dL (ref 3.5–5.0)
Alkaline Phosphatase: 77 U/L (ref 38–126)
Anion gap: 7 (ref 5–15)
BUN: 8 mg/dL (ref 8–23)
CO2: 26 mmol/L (ref 22–32)
Calcium: 8.8 mg/dL — ABNORMAL LOW (ref 8.9–10.3)
Chloride: 107 mmol/L (ref 98–111)
Creatinine, Ser: 0.91 mg/dL (ref 0.61–1.24)
GFR, Estimated: >60 mL/min (ref >60)
Glucose, Bld: 110 mg/dL — ABNORMAL HIGH (ref 70–99)
Potassium: 3.5 mmol/L (ref 3.5–5.1)
Sodium: 140 mmol/L (ref 135–145)
Total Bilirubin: 0.7 mg/dL (ref 0.3–1.2)
Total Protein: 7.2 g/dL (ref 6.5–8.1)

## 2021-07-11 LAB — CBG MONITORING, ED: Glucose-Capillary: 103 mg/dL — ABNORMAL HIGH (ref 70–99)

## 2021-07-11 LAB — I-STAT CHEM 8, ED
BUN: 10 mg/dL (ref 8–23)
Calcium, Ion: 1.13 mmol/L — ABNORMAL LOW (ref 1.15–1.40)
Chloride: 103 mmol/L (ref 98–111)
Creatinine, Ser: 0.8 mg/dL (ref 0.61–1.24)
Glucose, Bld: 111 mg/dL — ABNORMAL HIGH (ref 70–99)
HCT: 44 % (ref 39.0–52.0)
Hemoglobin: 15 g/dL (ref 13.0–17.0)
Potassium: 3.5 mmol/L (ref 3.5–5.1)
Sodium: 141 mmol/L (ref 135–145)
TCO2: 25 mmol/L (ref 22–32)

## 2021-07-11 LAB — PROTIME-INR
INR: 1.7 — ABNORMAL HIGH (ref 0.8–1.2)
Prothrombin Time: 20.2 seconds — ABNORMAL HIGH (ref 11.4–15.2)

## 2021-07-11 LAB — APTT: aPTT: 31 seconds (ref 24–36)

## 2021-07-11 MED ORDER — SODIUM CHLORIDE 0.9% FLUSH
3.0000 mL | Freq: Once | INTRAVENOUS | Status: AC
  Administered 2021-07-11: 3 mL via INTRAVENOUS

## 2021-07-11 NOTE — ED Notes (Signed)
Patient transported to MRI 

## 2021-07-11 NOTE — ED Provider Notes (Signed)
Received patient as signout from Dr Doren Custard, briefly 87 yo here with right sided facial droop, pending MRI brain scan.  If negative anticipate discharge home with likely Bell's palsy diagnosis.  Sx onset yesterday morning. ? ?630 pm -MRI of the brain personally reviewed and agree with radiologist, no acute stroke findings.  I reassessed the patient and he has no other new symptoms.  He continues to have a right-sided facial droop involving both the forehead and the lower face.  I suspect this is a Bell's palsy.  He is not able to completely close his right eye.  We discussed artificial tears in the daytime and gently taping his eye shut at night.  He is a borderline diabetic, and I would avoid steroids at this time.  No other infectious symptoms at this time.  Okay for discharge.  He will call family. ?  ?Wyvonnia Dusky, MD ?07/11/21 1837 ? ?

## 2021-07-11 NOTE — Discharge Instructions (Signed)
I recommend that you buy artificial tears to use in the daytime to keep your right eye lubricated and moist.  At night I recommend that you gently tape your eye shut so that it does not dry out or get scratched when you sleep.  Most people do recover from Bell's palsy within a month, but not everyone has a full recovery.  Please follow-up with your doctor's office next week to see how you are doing. ? ?The MRI of your brain thankfully did not show signs of any new strokes. ?

## 2021-07-11 NOTE — ED Notes (Signed)
RN assisted pt to restroom. Pt had a wide steady gait. Pt ambulated to bed without incident.  ?

## 2021-07-11 NOTE — ED Notes (Signed)
Notified EDP pt passed swallow screen. ?

## 2021-07-11 NOTE — ED Provider Notes (Signed)
?[facility] ?Provider Note ? ? ?CSN: [id number] ?Arrival date & time: 07/11/21  1255 ? ?  ? ?History ? ?Chief Complaint  ?Patient presents with  ? Stroke Symptoms  ? ? ?[name] is a 87 y.o. male. ? ?HPI ?Patient presents for right-sided facial droop.  He states that the onset was yesterday morning when he woke up.  He has had slurred speech.  He denies any numbness or weakness.  He denies any other physical complaints.  His medical history is notable for seizures, PE, HTN, HLD, CVA.  He currently lives at home with his brother.  Per chart review, he was seen in neurology office 3 weeks ago for follow-up of seizure disorder.  He reported no recurrence of seizures at that time.  He was tolerating Keppra (1500 in the morning and 2250 at night). ?  ? ?Home Medications ?Prior to Admission medications   ?Medication Sig Start Date End Date Taking? Authorizing Provider  ?amLODipine (NORVASC) 10 MG tablet Take 1 tablet (10 mg total) by mouth daily. 08/12/19 07/11/21 Yes Kayleen Memos, DO  ?aspirin EC 81 MG tablet Take 1 tablet (81 mg total) by mouth daily. 06/26/19  Yes Marcial Pacas, MD  ?atorvastatin (LIPITOR) 80 MG tablet Take 1 tablet (80 mg total) by mouth daily. 08/12/19 07/11/21 Yes Hall, Lorenda Cahill, DO  ?levETIRAcetam (KEPPRA) 750 MG tablet TAKE 2 TABLETS BY MOUTH EVERY DAY IN THE MORNING AND 3 TABS AT BEDTIME ?Patient taking differently: Take 1,500-2,250 mg by mouth See admin instructions. Take 1,500 mg by mouth every day in the morning and 2,250 mg at bedtime. 06/16/21  Yes Marcial Pacas, MD  ?warfarin (COUMADIN) 5 MG tablet Take 2.5-5 mg by mouth See admin instructions. Take 5 mg Monday, Wednesday, Friday, and Saturday. Take 2.5 mg Tuesday, Thursday, and Sunday.   Yes [provider]  ?edoxaban (SAVAYSA) 60 MG TABS tablet Take 60 mg by mouth daily. ?Patient not taking: Reported on 07/11/2021 06/24/19   Hosie Poisson, MD  ?ferrous sulfate 325 (65 FE) MG EC tablet Take 1 tablet (325  mg total) by mouth 2 (two) times daily. ?Patient not taking: Reported on 07/11/2021 06/24/19   Hosie Poisson, MD  ?   ? ?Allergies    ?Patient has no known allergies.   ? ?Review of Systems   ?Review of Systems  ?Neurological:  Positive for facial asymmetry and speech difficulty.  ?All other systems reviewed and are negative. ? ?Physical Exam ?Updated Vital Signs ?BP (!) 160/105   Pulse 95   Temp 98.1 ?F (36.7 ?C) (Oral)   Resp (!) 30   Ht 6' (1.829 m)   Wt 83.9 kg   SpO2 98%   BMI 25.09 kg/m?  ?Physical Exam ?Vitals and nursing note reviewed.  ?Constitutional:   ?   General: He is not in acute distress. ?   Appearance: He is well-developed and normal weight. He is not ill-appearing, toxic-appearing or diaphoretic.  ?HENT:  ?   Head: Normocephalic and atraumatic.  ?   Right Ear: External ear normal.  ?   Left Ear: External ear normal.  ?   Nose: Nose normal.  ?   Mouth/Throat:  ?   Mouth: Mucous membranes are moist.  ?Eyes:  ?   Extraocular Movements: Extraocular movements intact.  ?   Conjunctiva/sclera: Conjunctivae normal.  ?   Comments: Left eye esotropia at rest with EOM intact, denies history of the left eye deviation but left eye abductor difficulty  is documented in previous notes, he denies diplopia  ?Cardiovascular:  ?   Rate and Rhythm: Normal rate and regular rhythm.  ?   Heart sounds: No murmur heard. ?Pulmonary:  ?   Effort: Pulmonary effort is normal. No respiratory distress.  ?   Breath sounds: Normal breath sounds.  ?Abdominal:  ?   Palpations: Abdomen is soft.  ?   Tenderness: There is no abdominal tenderness.  ?Musculoskeletal:     ?   General: No swelling. Normal range of motion.  ?   Cervical back: Normal range of motion and neck supple.  ?   Right lower leg: No edema.  ?   Left lower leg: No edema.  ?Skin: ?   General: Skin is warm and dry.  ?   Coloration: Skin is not jaundiced or pale.  ?Neurological:  ?   Mental Status: He is alert.  ?   Cranial Nerves: Facial asymmetry present.  ?    Sensory: Sensation is intact. No sensory deficit.  ?   Motor: Motor function is intact. No weakness or abnormal muscle tone.  ?   Coordination: Coordination is intact.  ?   Comments: Right-sided facial droop that includes the forehead, consistent with Bell's palsy  ?Psychiatric:     ?   Mood and Affect: Mood normal.  ? ? ?ED Results / Procedures / Treatments   ?Labs ?(all labs ordered are listed, but only abnormal results are displayed) ?Labs Reviewed  ?PROTIME-INR - Abnormal; Notable for the following components:  ?    Result Value  ? Prothrombin Time 20.2 (*)   ? INR 1.7 (*)   ? All other components within normal limits  ?COMPREHENSIVE METABOLIC PANEL - Abnormal; Notable for the following components:  ? Glucose, Bld 110 (*)   ? Calcium 8.8 (*)   ? All other components within normal limits  ?CBG MONITORING, ED - Abnormal; Notable for the following components:  ? Glucose-Capillary 103 (*)   ? All other components within normal limits  ?I-STAT CHEM 8, ED - Abnormal; Notable for the following components:  ? Glucose, Bld 111 (*)   ? Calcium, Ion 1.13 (*)   ? All other components within normal limits  ?APTT  ?CBC  ?DIFFERENTIAL  ?CBG MONITORING, ED  ? ? ?EKG ?EKG Interpretation ? ?Date/Time:  Monday July 11 2021 13:02:22 EDT ?Ventricular Rate:  96 ?PR Interval:  244 ?QRS Duration: 83 ?QT Interval:  343 ?QTC Calculation: 434 ?R Axis:   30 ?Text Interpretation: Sinus rhythm Ventricular premature complex Prolonged PR interval Anteroseptal infarct, old Confirmed by Octaviano Glow [phone]) on 07/11/2021 4:27:26 PM ? ?Radiology ?CT HEAD WO CONTRAST ? ?Result Date: 07/11/2021 ?CLINICAL DATA:  Neuro deficit, acute, stroke suspected EXAM: CT HEAD WITHOUT CONTRAST TECHNIQUE: Contiguous axial images were obtained from the base of the skull through the vertex without intravenous contrast. RADIATION DOSE REDUCTION: This exam was performed according to the departmental dose-optimization program which includes automated exposure  control, adjustment of the mA and/or kV according to patient size and/or use of iterative reconstruction technique. COMPARISON:  08/09/2019 FINDINGS: Brain: No evidence of acute infarction, hemorrhage, hydrocephalus, extra-axial collection or mass lesion/mass effect. Patchy low-density changes within the periventricular and subcortical white matter compatible with chronic microvascular ischemic change. Mild diffuse cerebral volume loss. Vascular: Atherosclerotic calcifications involving the large vessels of the skull base. No unexpected hyperdense vessel. Skull: Normal. Negative for fracture or focal lesion. Sinuses/Orbits: No acute finding. Other: None. IMPRESSION: 1. No acute intracranial abnormality.  2. Chronic microvascular ischemic change and cerebral volume loss. Electronically Signed   By: Davina Poke D.O.   On: 07/11/2021 13:50  ? ?MR BRAIN WO CONTRAST ? ?Result Date: 07/11/2021 ?CLINICAL DATA:  Neuro deficit, acute, stroke suspected EXAM: MRI HEAD WITHOUT CONTRAST TECHNIQUE: Multiplanar, multiecho pulse sequences of the brain and surrounding structures were obtained without intravenous contrast. COMPARISON:  CT head from the same day.  MRI head Aug 09, 2019. FINDINGS: Brain: No acute infarction, hemorrhage, hydrocephalus, extra-axial collection or mass lesion. Moderate patchy T2/FLAIR hyperintensities in the white matter, nonspecific but compatible with chronic microvascular ischemic disease. Small remote left frontal cortical infarct. Vascular: Major arterial flow voids are maintained at the skull base. Skull and upper cervical spine: Normal marrow signal. Partially imaged degenerative change of the cervical spine with at least mild canal stenosis at multiple levels. Sinuses/Orbits: Clear sinuses.  No acute orbital findings. Other: No mastoid effusions. IMPRESSION: 1. No evidence of acute intracranial abnormality. 2. Moderate chronic microvascular ischemic disease and remote left frontal infarct.  Electronically Signed   By: Margaretha Sheffield M.D.   On: 07/11/2021 18:16   ? ?Procedures ?Procedures  ? ? ?Medications Ordered in ED ?Medications  ?sodium chloride flush (NS) 0.9 % injection 3 mL (3 mLs Intravenous Given

## 2021-07-11 NOTE — ED Triage Notes (Signed)
Pt arrived via [facility] from home for c/o right sided facial droop and numbness and slurred speech. Pt's LKW is unknown, but some time yesterday. Pt is A&Ox4. Pt a little hypertensive. ?

## 2021-07-25 DIAGNOSIS — Z7901 Long term (current) use of anticoagulants: Secondary | ICD-10-CM | POA: Diagnosis not present

## 2021-08-02 DIAGNOSIS — Z8673 Personal history of transient ischemic attack (TIA), and cerebral infarction without residual deficits: Secondary | ICD-10-CM | POA: Diagnosis not present

## 2021-08-02 DIAGNOSIS — Z79899 Other long term (current) drug therapy: Secondary | ICD-10-CM | POA: Diagnosis not present

## 2021-08-02 DIAGNOSIS — G510 Bell's palsy: Secondary | ICD-10-CM | POA: Diagnosis not present

## 2021-08-02 DIAGNOSIS — E7800 Pure hypercholesterolemia, unspecified: Secondary | ICD-10-CM | POA: Diagnosis not present

## 2021-08-02 DIAGNOSIS — E119 Type 2 diabetes mellitus without complications: Secondary | ICD-10-CM | POA: Diagnosis not present

## 2021-08-02 DIAGNOSIS — Z86711 Personal history of pulmonary embolism: Secondary | ICD-10-CM | POA: Diagnosis not present

## 2021-08-02 DIAGNOSIS — I10 Essential (primary) hypertension: Secondary | ICD-10-CM | POA: Diagnosis not present

## 2021-08-26 DIAGNOSIS — Z7901 Long term (current) use of anticoagulants: Secondary | ICD-10-CM | POA: Diagnosis not present

## 2021-09-12 DIAGNOSIS — H2512 Age-related nuclear cataract, left eye: Secondary | ICD-10-CM | POA: Diagnosis not present

## 2021-09-12 DIAGNOSIS — H52222 Regular astigmatism, left eye: Secondary | ICD-10-CM | POA: Diagnosis not present

## 2021-09-28 DIAGNOSIS — Z7901 Long term (current) use of anticoagulants: Secondary | ICD-10-CM | POA: Diagnosis not present

## 2021-10-05 DIAGNOSIS — Z7901 Long term (current) use of anticoagulants: Secondary | ICD-10-CM | POA: Diagnosis not present

## 2021-10-05 DIAGNOSIS — E119 Type 2 diabetes mellitus without complications: Secondary | ICD-10-CM | POA: Diagnosis not present

## 2021-10-05 DIAGNOSIS — M5432 Sciatica, left side: Secondary | ICD-10-CM | POA: Diagnosis not present

## 2021-10-12 DIAGNOSIS — Z7901 Long term (current) use of anticoagulants: Secondary | ICD-10-CM | POA: Diagnosis not present

## 2021-11-10 DIAGNOSIS — E785 Hyperlipidemia, unspecified: Secondary | ICD-10-CM | POA: Diagnosis not present

## 2021-11-10 DIAGNOSIS — Z139 Encounter for screening, unspecified: Secondary | ICD-10-CM | POA: Diagnosis not present

## 2021-11-10 DIAGNOSIS — Z9181 History of falling: Secondary | ICD-10-CM | POA: Diagnosis not present

## 2021-11-10 DIAGNOSIS — Z0000 Encounter for general adult medical examination without abnormal findings: Secondary | ICD-10-CM | POA: Diagnosis not present

## 2021-11-15 DIAGNOSIS — G510 Bell's palsy: Secondary | ICD-10-CM | POA: Diagnosis not present

## 2021-11-15 DIAGNOSIS — Z8673 Personal history of transient ischemic attack (TIA), and cerebral infarction without residual deficits: Secondary | ICD-10-CM | POA: Diagnosis not present

## 2021-11-15 DIAGNOSIS — E7800 Pure hypercholesterolemia, unspecified: Secondary | ICD-10-CM | POA: Diagnosis not present

## 2021-11-15 DIAGNOSIS — Z7901 Long term (current) use of anticoagulants: Secondary | ICD-10-CM | POA: Diagnosis not present

## 2021-11-15 DIAGNOSIS — E119 Type 2 diabetes mellitus without complications: Secondary | ICD-10-CM | POA: Diagnosis not present

## 2021-11-15 DIAGNOSIS — Z86711 Personal history of pulmonary embolism: Secondary | ICD-10-CM | POA: Diagnosis not present

## 2021-11-15 DIAGNOSIS — I10 Essential (primary) hypertension: Secondary | ICD-10-CM | POA: Diagnosis not present

## 2022-02-06 DIAGNOSIS — E119 Type 2 diabetes mellitus without complications: Secondary | ICD-10-CM | POA: Diagnosis not present

## 2022-02-06 DIAGNOSIS — Z86711 Personal history of pulmonary embolism: Secondary | ICD-10-CM | POA: Diagnosis not present

## 2022-02-06 DIAGNOSIS — Z8673 Personal history of transient ischemic attack (TIA), and cerebral infarction without residual deficits: Secondary | ICD-10-CM | POA: Diagnosis not present

## 2022-02-06 DIAGNOSIS — I10 Essential (primary) hypertension: Secondary | ICD-10-CM | POA: Diagnosis not present

## 2022-02-06 DIAGNOSIS — E7800 Pure hypercholesterolemia, unspecified: Secondary | ICD-10-CM | POA: Diagnosis not present

## 2022-02-06 DIAGNOSIS — Z7901 Long term (current) use of anticoagulants: Secondary | ICD-10-CM | POA: Diagnosis not present

## 2022-02-06 DIAGNOSIS — Z79899 Other long term (current) drug therapy: Secondary | ICD-10-CM | POA: Diagnosis not present

## 2022-02-06 DIAGNOSIS — G510 Bell's palsy: Secondary | ICD-10-CM | POA: Diagnosis not present

## 2022-03-13 DIAGNOSIS — R791 Abnormal coagulation profile: Secondary | ICD-10-CM | POA: Diagnosis not present

## 2022-03-22 DIAGNOSIS — Z7901 Long term (current) use of anticoagulants: Secondary | ICD-10-CM | POA: Diagnosis not present

## 2022-04-05 DIAGNOSIS — Z7901 Long term (current) use of anticoagulants: Secondary | ICD-10-CM | POA: Diagnosis not present

## 2022-04-19 DIAGNOSIS — Z7901 Long term (current) use of anticoagulants: Secondary | ICD-10-CM | POA: Diagnosis not present

## 2022-05-09 DIAGNOSIS — E119 Type 2 diabetes mellitus without complications: Secondary | ICD-10-CM | POA: Diagnosis not present

## 2022-05-09 DIAGNOSIS — Z8673 Personal history of transient ischemic attack (TIA), and cerebral infarction without residual deficits: Secondary | ICD-10-CM | POA: Diagnosis not present

## 2022-05-09 DIAGNOSIS — E7800 Pure hypercholesterolemia, unspecified: Secondary | ICD-10-CM | POA: Diagnosis not present

## 2022-05-09 DIAGNOSIS — G510 Bell's palsy: Secondary | ICD-10-CM | POA: Diagnosis not present

## 2022-05-09 DIAGNOSIS — Z86711 Personal history of pulmonary embolism: Secondary | ICD-10-CM | POA: Diagnosis not present

## 2022-05-09 DIAGNOSIS — I10 Essential (primary) hypertension: Secondary | ICD-10-CM | POA: Diagnosis not present

## 2022-05-22 DIAGNOSIS — Z7901 Long term (current) use of anticoagulants: Secondary | ICD-10-CM | POA: Diagnosis not present

## 2022-06-15 ENCOUNTER — Other Ambulatory Visit: Payer: Self-pay | Admitting: Neurology

## 2022-06-21 DIAGNOSIS — Z7901 Long term (current) use of anticoagulants: Secondary | ICD-10-CM | POA: Diagnosis not present

## 2022-07-09 ENCOUNTER — Other Ambulatory Visit: Payer: Self-pay | Admitting: Neurology

## 2022-07-14 ENCOUNTER — Other Ambulatory Visit: Payer: Self-pay | Admitting: Neurology

## 2022-07-25 DIAGNOSIS — Z7901 Long term (current) use of anticoagulants: Secondary | ICD-10-CM | POA: Diagnosis not present

## 2022-07-25 DIAGNOSIS — Z86711 Personal history of pulmonary embolism: Secondary | ICD-10-CM | POA: Diagnosis not present

## 2022-08-08 DIAGNOSIS — Z79899 Other long term (current) drug therapy: Secondary | ICD-10-CM | POA: Diagnosis not present

## 2022-08-08 DIAGNOSIS — G510 Bell's palsy: Secondary | ICD-10-CM | POA: Diagnosis not present

## 2022-08-08 DIAGNOSIS — Z8673 Personal history of transient ischemic attack (TIA), and cerebral infarction without residual deficits: Secondary | ICD-10-CM | POA: Diagnosis not present

## 2022-08-08 DIAGNOSIS — E119 Type 2 diabetes mellitus without complications: Secondary | ICD-10-CM | POA: Diagnosis not present

## 2022-08-08 DIAGNOSIS — I10 Essential (primary) hypertension: Secondary | ICD-10-CM | POA: Diagnosis not present

## 2022-08-08 DIAGNOSIS — E7800 Pure hypercholesterolemia, unspecified: Secondary | ICD-10-CM | POA: Diagnosis not present

## 2022-08-08 DIAGNOSIS — Z86711 Personal history of pulmonary embolism: Secondary | ICD-10-CM | POA: Diagnosis not present

## 2022-08-09 ENCOUNTER — Other Ambulatory Visit: Payer: Self-pay | Admitting: Neurology

## 2022-08-28 DIAGNOSIS — R791 Abnormal coagulation profile: Secondary | ICD-10-CM | POA: Diagnosis not present

## 2022-08-30 DIAGNOSIS — Z7901 Long term (current) use of anticoagulants: Secondary | ICD-10-CM | POA: Diagnosis not present

## 2022-09-05 DIAGNOSIS — W010XXA Fall on same level from slipping, tripping and stumbling without subsequent striking against object, initial encounter: Secondary | ICD-10-CM | POA: Diagnosis not present

## 2022-09-05 DIAGNOSIS — M79602 Pain in left arm: Secondary | ICD-10-CM | POA: Diagnosis not present

## 2022-09-05 DIAGNOSIS — S63502A Unspecified sprain of left wrist, initial encounter: Secondary | ICD-10-CM | POA: Diagnosis not present

## 2022-09-06 DIAGNOSIS — R791 Abnormal coagulation profile: Secondary | ICD-10-CM | POA: Diagnosis not present

## 2022-09-06 DIAGNOSIS — S63502A Unspecified sprain of left wrist, initial encounter: Secondary | ICD-10-CM | POA: Diagnosis not present

## 2022-09-06 DIAGNOSIS — W010XXA Fall on same level from slipping, tripping and stumbling without subsequent striking against object, initial encounter: Secondary | ICD-10-CM | POA: Diagnosis not present

## 2022-09-13 DIAGNOSIS — R791 Abnormal coagulation profile: Secondary | ICD-10-CM | POA: Diagnosis not present

## 2022-09-13 DIAGNOSIS — Z7901 Long term (current) use of anticoagulants: Secondary | ICD-10-CM | POA: Diagnosis not present

## 2022-09-27 DIAGNOSIS — Z7901 Long term (current) use of anticoagulants: Secondary | ICD-10-CM | POA: Diagnosis not present

## 2022-10-04 DIAGNOSIS — Z7901 Long term (current) use of anticoagulants: Secondary | ICD-10-CM | POA: Diagnosis not present

## 2022-11-06 DIAGNOSIS — Z7901 Long term (current) use of anticoagulants: Secondary | ICD-10-CM | POA: Diagnosis not present

## 2022-11-14 DIAGNOSIS — E7800 Pure hypercholesterolemia, unspecified: Secondary | ICD-10-CM | POA: Diagnosis not present

## 2022-11-14 DIAGNOSIS — I10 Essential (primary) hypertension: Secondary | ICD-10-CM | POA: Diagnosis not present

## 2022-11-14 DIAGNOSIS — Z8673 Personal history of transient ischemic attack (TIA), and cerebral infarction without residual deficits: Secondary | ICD-10-CM | POA: Diagnosis not present

## 2022-11-14 DIAGNOSIS — Z9181 History of falling: Secondary | ICD-10-CM | POA: Diagnosis not present

## 2022-11-14 DIAGNOSIS — Z86711 Personal history of pulmonary embolism: Secondary | ICD-10-CM | POA: Diagnosis not present

## 2022-11-14 DIAGNOSIS — E119 Type 2 diabetes mellitus without complications: Secondary | ICD-10-CM | POA: Diagnosis not present

## 2022-11-14 DIAGNOSIS — Z139 Encounter for screening, unspecified: Secondary | ICD-10-CM | POA: Diagnosis not present

## 2022-11-15 DIAGNOSIS — Z0000 Encounter for general adult medical examination without abnormal findings: Secondary | ICD-10-CM | POA: Diagnosis not present

## 2022-11-15 DIAGNOSIS — Z9181 History of falling: Secondary | ICD-10-CM | POA: Diagnosis not present

## 2022-11-15 DIAGNOSIS — Z139 Encounter for screening, unspecified: Secondary | ICD-10-CM | POA: Diagnosis not present

## 2022-12-07 DIAGNOSIS — Z7901 Long term (current) use of anticoagulants: Secondary | ICD-10-CM | POA: Diagnosis not present

## 2022-12-21 DIAGNOSIS — R791 Abnormal coagulation profile: Secondary | ICD-10-CM | POA: Diagnosis not present

## 2023-01-05 DIAGNOSIS — Z86711 Personal history of pulmonary embolism: Secondary | ICD-10-CM | POA: Diagnosis not present

## 2023-01-05 DIAGNOSIS — Z7901 Long term (current) use of anticoagulants: Secondary | ICD-10-CM | POA: Diagnosis not present

## 2023-01-19 DIAGNOSIS — Z7901 Long term (current) use of anticoagulants: Secondary | ICD-10-CM | POA: Diagnosis not present

## 2023-01-20 ENCOUNTER — Other Ambulatory Visit: Payer: Self-pay | Admitting: Neurology

## 2023-02-19 DIAGNOSIS — E7800 Pure hypercholesterolemia, unspecified: Secondary | ICD-10-CM | POA: Diagnosis not present

## 2023-02-19 DIAGNOSIS — Z8673 Personal history of transient ischemic attack (TIA), and cerebral infarction without residual deficits: Secondary | ICD-10-CM | POA: Diagnosis not present

## 2023-02-19 DIAGNOSIS — Z86711 Personal history of pulmonary embolism: Secondary | ICD-10-CM | POA: Diagnosis not present

## 2023-02-19 DIAGNOSIS — I10 Essential (primary) hypertension: Secondary | ICD-10-CM | POA: Diagnosis not present

## 2023-02-19 DIAGNOSIS — E119 Type 2 diabetes mellitus without complications: Secondary | ICD-10-CM | POA: Diagnosis not present

## 2023-02-19 DIAGNOSIS — Z7901 Long term (current) use of anticoagulants: Secondary | ICD-10-CM | POA: Diagnosis not present

## 2023-02-20 ENCOUNTER — Other Ambulatory Visit: Payer: Self-pay | Admitting: Neurology

## 2023-03-17 ENCOUNTER — Other Ambulatory Visit: Payer: Self-pay | Admitting: Neurology

## 2023-03-26 DIAGNOSIS — Z7901 Long term (current) use of anticoagulants: Secondary | ICD-10-CM | POA: Diagnosis not present

## 2023-04-27 DIAGNOSIS — Z86711 Personal history of pulmonary embolism: Secondary | ICD-10-CM | POA: Diagnosis not present

## 2023-04-27 DIAGNOSIS — Z7901 Long term (current) use of anticoagulants: Secondary | ICD-10-CM | POA: Diagnosis not present

## 2023-05-21 DIAGNOSIS — I10 Essential (primary) hypertension: Secondary | ICD-10-CM | POA: Diagnosis not present

## 2023-05-21 DIAGNOSIS — Z8673 Personal history of transient ischemic attack (TIA), and cerebral infarction without residual deficits: Secondary | ICD-10-CM | POA: Diagnosis not present

## 2023-05-21 DIAGNOSIS — E7800 Pure hypercholesterolemia, unspecified: Secondary | ICD-10-CM | POA: Diagnosis not present

## 2023-05-21 DIAGNOSIS — Z86711 Personal history of pulmonary embolism: Secondary | ICD-10-CM | POA: Diagnosis not present

## 2023-05-21 DIAGNOSIS — E119 Type 2 diabetes mellitus without complications: Secondary | ICD-10-CM | POA: Diagnosis not present

## 2023-08-07 DIAGNOSIS — Z7901 Long term (current) use of anticoagulants: Secondary | ICD-10-CM | POA: Diagnosis not present

## 2023-08-21 DIAGNOSIS — Z8673 Personal history of transient ischemic attack (TIA), and cerebral infarction without residual deficits: Secondary | ICD-10-CM | POA: Diagnosis not present

## 2023-08-21 DIAGNOSIS — I10 Essential (primary) hypertension: Secondary | ICD-10-CM | POA: Diagnosis not present

## 2023-08-21 DIAGNOSIS — E119 Type 2 diabetes mellitus without complications: Secondary | ICD-10-CM | POA: Diagnosis not present

## 2023-08-21 DIAGNOSIS — E7800 Pure hypercholesterolemia, unspecified: Secondary | ICD-10-CM | POA: Diagnosis not present

## 2023-08-21 DIAGNOSIS — Z86711 Personal history of pulmonary embolism: Secondary | ICD-10-CM | POA: Diagnosis not present

## 2023-09-10 DIAGNOSIS — Z86711 Personal history of pulmonary embolism: Secondary | ICD-10-CM | POA: Diagnosis not present

## 2023-09-10 DIAGNOSIS — Z7901 Long term (current) use of anticoagulants: Secondary | ICD-10-CM | POA: Diagnosis not present

## 2023-10-12 DIAGNOSIS — Z7901 Long term (current) use of anticoagulants: Secondary | ICD-10-CM | POA: Diagnosis not present

## 2023-10-12 DIAGNOSIS — Z86711 Personal history of pulmonary embolism: Secondary | ICD-10-CM | POA: Diagnosis not present

## 2023-10-13 ENCOUNTER — Other Ambulatory Visit: Payer: Self-pay | Admitting: Neurology

## 2023-10-15 ENCOUNTER — Telehealth: Payer: Self-pay | Admitting: Neurology

## 2023-10-15 NOTE — Telephone Encounter (Signed)
 Patient's daugther, Trinda Birmingham called to schedule an appointment for patient will be able to get refill for Keppra .   Patient has been schedule with Lauraine Born, NP for follow up to be able to get refill for Keppra .  Patient out of medication, pharmacy has given 3 day supply until get refill.

## 2023-10-15 NOTE — Progress Notes (Signed)
 Patient: [name] Date of Birth: [birth year]  Reason for Visit: Follow up History from: Patient Primary Neurologist: Onita  ASSESSMENT AND PLAN 89 y.o. year old male   1.  Epilepsy - Doing well, continue Keppra  750 mg, 2 tablets AM/3 tablets PM - Last seizure June 23, 2019 - Check routine labs today - Follow-up in 1 year  2.  Cerebral small vessel disease 3.  Gait abnormality - On Coumadin  - MRI of the brain in May 2021 showed no large vessel disease - Echocardiogram in May 2021 EF 55 to 60% - CTA of the head and neck showed no LVO April 2021 - Continue follow-up with primary care for management of vascular risk factors  HISTORY  [name] is a 89 years old man, seen in request by his primary care PA Montey Lot for evaluation of seizure, he was discharged from hospital on March 08, 2019 for recurrent seizure   I saw him initially in June 2014, but has lost follow-up since then,   He had a past medical history of hypertension, hyperlipidemia, diabetes, GERD multiple recurrent seizures,   He woke up one day in May 2014, chewed his tongue, achy all over, was told he had a seizure, he lives with his brother, who also suffered complex partial [name], [name] ( Feb 9th 1939) is a patient of ours   This is not his first episode, initial one was in 2009, he woke up on his way to hospital on the ambulance, he was found by his brother having a seizure,   He also had one minor episode in April 2014, he woke up with tongue biting, suspicious he might have a seizure as well,   He never had daytime passing out, No history of head trauma, or stroke,   MRI of the brain in December 2012 without contrast for evaluation of seizure-like activity, reported atrophy, small vessel disease, no contrast was given,   EEG in 2012 was normal   He had recurrent seizure on March 07, 2019 during his sleep, woke up at the hospital, home regimen was Dilantin  160 in the morning, 200 at night,  Keppra  500 mg twice daily,   Laboratory in December 2020, normal CBC, hemoglobin of 13.6, BMP, glucose of 112, Dilantin  level was 12.7, UDS was negative   I personally reviewed CT head without contrast December 2020, generalized atrophy, supratentorium small vessel disease.   He is now taking Dilantin  30 mg +100 mg, Keppra  750 mg twice a day   Echocardiogram in December 2020 showed ejection fraction 55 to 60%, bilateral atrial size were normal, possible density attached to the anterior mitral valve leaflets,   UPDATE June 26 2019: He is alone at today's clinical visit Since last visit on March 24, 2019, his Keppra  dose was increased to 750 mg 2 tablets twice a day, he was weaned off Dilantin    Repeat EEG on April 07, 2019 showed no significant abnormality He presented to emergency room on June 23, 2019 after having seizure at home, his brother noticed that he was behaving unusually, complains of a headache, followed by a generalized seizure,   He was given 1 g of IV Keppra  load, personally reviewed CT head without contrast  no acute abnormality, moderate atrophy, supratentorium small vessel disease   Laboratory evaluation showed normal CBC, CMP, magnesium level, negative alcohol, UDS, Dilantin  level was less than 2.5, Keppra  level was 54.6,   He was noted to have rapid irregular heart rate on today's examination,  reviewed extensive cardiology record, he had loop recorder REveal Lin1 [id number] implant on March 26, 2014, reviewed remote device check from Jan 2016 to April 2019, tachycardia episode, fastest 176 bpm, he is supposed to follow-up with his cardiologist Dr.Croitorum   He also complains of sudden onset double vision on June 04, 2019, was seen by ophthalmologist Dr. 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   Personally reviewed CT angiogram in April 2021, no large vessel disease   Update October 16, 2023 SS: Last seen March 2023. Doing well, no seizures, remains on Keppra  750 mg 2/3. Right Bell's palsy in April 2023, his speech is still not as clear at times. Lives with his brother, some driving, his daughter brought him today. Wants a refill on Keppra .  REVIEW OF SYSTEMS: Out of a complete 14 system review of symptoms, the patient complains only of the following symptoms, and all other reviewed systems are negative.  See HPI  ALLERGIES: No Known Allergies  HOME MEDICATIONS: Outpatient Medications Prior to Visit  Medication Sig Dispense Refill   amLODipine  (NORVASC ) 10 MG tablet Take 1 tablet (10 mg total) by mouth daily. 90 tablet 0   aspirin  EC 81 MG tablet Take 1 tablet (81 mg total) by mouth daily. 90 tablet 11   atorvastatin  (LIPITOR ) 80 MG tablet Take 1 tablet (80 mg total) by mouth daily. 90 tablet 0   warfarin (COUMADIN ) 5 MG tablet Take 2.5-5 mg by mouth See admin instructions. Take 5 mg Monday, Wednesday, Friday, and Saturday. Take 2.5 mg Tuesday, Thursday, and Sunday.     levETIRAcetam  (KEPPRA ) 750 MG tablet 2 TABS BY MOUTH EVERY DAY IN THE MORNING AND 3 TABS AT BEDTIME.APPOINTMENT NEEDED FOR FURTHER REFILL 450 tablet 1   edoxaban  (SAVAYSA ) 60 MG TABS tablet Take 60 mg by mouth daily. (Patient not taking: Reported on 10/16/2023) 30 tablet 11   ferrous sulfate  325 (65 FE) MG EC tablet Take 1 tablet (325 mg  total) by mouth 2 (two) times daily. (Patient not taking: Reported on 10/16/2023) 60 tablet 5   No facility-administered medications prior to visit.    PAST MEDICAL HISTORY: Past Medical History:  Diagnosis Date   Anemia    Cancer (HCC)    Prostate   Cervicalgia    Essential hypertension, benign    History of loop recorder 07/14/2014   Impotence of organic origin    Insomnia    Pulmonary embolism (HCC)    Pure hypercholesterolemia    Saddle pulmonary embolus (HCC)    Seizures (HCC)    Type II or unspecified type diabetes mellitus without mention of complication, not stated as uncontrolled     PAST SURGICAL HISTORY: Past Surgical History:  Procedure Laterality Date   LOOP RECORDER IMPLANT N/A 03/26/2014   Procedure: LOOP RECORDER IMPLANT;  Surgeon: Jerel Balding, MD;  Location: [facility] CATH [facility];  Service: Cardiovascular;  Laterality: N/A;    FAMILY HISTORY: Family History  Problem Relation Age of Onset   Cancer Mother    Cancer Father     SOCIAL HISTORY: Social History   Socioeconomic History   Marital status: Single    Spouse name: Not on file   Number of children: Not on file   Years of education: 12   Highest education level: Not on file  Occupational History   Not on file  Tobacco Use   Smoking status: Former    Types: Cigarettes   Smokeless tobacco: Never  Vaping Use   Vaping status: Never Used  Substance and Sexual Activity   Alcohol use: No   Drug use: No   Sexual activity: Not on file  Other Topics Concern   Not on file  Social History Narrative   He lives with his brother, retired, driving, no smoking, no drinking.   Social Drivers of Corporate investment banker Strain: Not on file  Food Insecurity: Not on file  Transportation Needs: Not on file  Physical Activity: Not on file  Stress: Not on file  Social Connections: Not on file  Intimate Partner Violence: Not on file    PHYSICAL EXAM  Vitals:   10/16/23 1334  BP: (!) 151/89  Pulse:  (!) 103  SpO2: 98%  Weight: 182 lb (82.6 kg)  Height: 6' (1.829 m)   Body mass index is 24.68 kg/m.  Generalized: Well developed, in no acute distress  Neurological examination  Mentation: Alert oriented to time, place, history taking. Follows all commands speech and language fluent Cranial nerve II-XII: Pupils were equal round reactive to light.  Mild facial droop to the right mouth.  Decreased right eyebrow raise.  Head turning and shoulder shrug  were normal and symmetric. Motor: No significant muscle weakness was noted. Sensory: Sensory testing is intact to soft touch on all 4 extremities. No evidence of extinction is noted.  Coordination: Cerebellar testing reveals good finger-nose-finger and heel-to-shin bilaterally.  Gait and station: Gait is wide-based, unsteady, tends to brace on the wall, drags right leg Reflexes: Deep tendon reflexes are decreased  DIAGNOSTIC DATA (LABS, IMAGING, TESTING) - I reviewed patient records, labs, notes, testing and imaging myself where available.  Lab Results  Component Value Date   WBC 5.2 07/11/2021   HGB 15.0 07/11/2021   HCT 44.0 07/11/2021   MCV 99.1 07/11/2021   PLT 189 07/11/2021      Component Value Date/Time   NA 141 07/11/2021 1315   K 3.5 07/11/2021 1315   CL 103 07/11/2021 1315   CO2 26 07/11/2021 1304   GLUCOSE 111 (H) 07/11/2021 1315   BUN 10 07/11/2021 1315   CREATININE 0.80 07/11/2021 1315   CALCIUM  8.8 (L) 07/11/2021 1304   PROT 7.2 07/11/2021 1304   ALBUMIN 4.0 07/11/2021 1304   AST 19 07/11/2021 1304   ALT 12 07/11/2021 1304   ALKPHOS 77 07/11/2021 1304   BILITOT 0.7 07/11/2021 1304   GFRNONAA >60 07/11/2021 1304   GFRAA >60 08/11/2019 0328   Lab Results  Component Value Date   CHOL 152 08/10/2019   HDL 59 08/10/2019   LDLCALC 84 08/10/2019   TRIG 43 08/10/2019   CHOLHDL 2.6 08/10/2019   Lab Results  Component Value Date   HGBA1C 6.0 (H) 08/10/2019   No results found for: [id number] Lab Results   Component Value Date   TSH 0.460 05/16/2015    Lauraine Born, AGNP-C, DNP 10/16/2023, 2:06  PM [facility] [address], [address] [place], [place] [address] [phone]

## 2023-10-15 NOTE — Telephone Encounter (Signed)
 Phone room: Please call and make sure pt has enough meds to get to tomorrows appt and then refill can occur during appt tomorrow

## 2023-10-15 NOTE — Telephone Encounter (Signed)
 Called daughters phone number and it stated that it was out of service. Called pt's number and it stated that the call can not be completed.

## 2023-10-16 ENCOUNTER — Encounter: Payer: Self-pay | Admitting: Neurology

## 2023-10-16 ENCOUNTER — Ambulatory Visit: Admitting: Neurology

## 2023-10-16 VITALS — BP 151/89 | HR 103 | Ht 72.0 in | Wt 182.0 lb

## 2023-10-16 DIAGNOSIS — R269 Unspecified abnormalities of gait and mobility: Secondary | ICD-10-CM

## 2023-10-16 DIAGNOSIS — G40301 Generalized idiopathic epilepsy and epileptic syndromes, not intractable, with status epilepticus: Secondary | ICD-10-CM

## 2023-10-16 DIAGNOSIS — R569 Unspecified convulsions: Secondary | ICD-10-CM

## 2023-10-16 MED ORDER — LEVETIRACETAM 750 MG PO TABS: ORAL_TABLET | ORAL | 4 refills | Status: AC

## 2023-10-16 NOTE — Patient Instructions (Addendum)
 Great to see you today! Continue Keppra  for seizure prevention Check routine labs today Call for seizure activity Continue follow-up with primary care for management of vascular risk factors Follow-up in 1 year.  Thanks!!

## 2023-10-17 LAB — COMPREHENSIVE METABOLIC PANEL WITH GFR
ALT: 11 IU/L (ref 0–44)
AST: 17 IU/L (ref 0–40)
Albumin: 4.5 g/dL (ref 3.7–4.7)
Alkaline Phosphatase: 106 IU/L (ref 44–121)
BUN/Creatinine Ratio: 14 (ref 10–24)
BUN: 12 mg/dL (ref 8–27)
Bilirubin Total: 0.4 mg/dL (ref 0.0–1.2)
CO2: 22 mmol/L (ref 20–29)
Calcium: 9.3 mg/dL (ref 8.6–10.2)
Chloride: 103 mmol/L (ref 96–106)
Creatinine, Ser: 0.86 mg/dL (ref 0.76–1.27)
Globulin, Total: 2.9 g/dL (ref 1.5–4.5)
Glucose: 125 mg/dL — ABNORMAL HIGH (ref 70–99)
Potassium: 4.4 mmol/L (ref 3.5–5.2)
Sodium: 142 mmol/L (ref 134–144)
Total Protein: 7.4 g/dL (ref 6.0–8.5)
eGFR: 84 mL/min/1.73 (ref >59)

## 2023-10-17 LAB — LEVETIRACETAM LEVEL: Levetiracetam Lvl: 56.5 ug/mL — ABNORMAL HIGH (ref 10.0–40.0)

## 2023-10-18 ENCOUNTER — Ambulatory Visit: Payer: Self-pay | Admitting: Neurology

## 2023-10-22 NOTE — Telephone Encounter (Signed)
 Called pt twice and both times call can not be completed.

## 2023-11-14 DIAGNOSIS — Z86711 Personal history of pulmonary embolism: Secondary | ICD-10-CM | POA: Diagnosis not present

## 2023-11-14 DIAGNOSIS — Z7901 Long term (current) use of anticoagulants: Secondary | ICD-10-CM | POA: Diagnosis not present

## 2023-11-21 DIAGNOSIS — E118 Type 2 diabetes mellitus with unspecified complications: Secondary | ICD-10-CM | POA: Diagnosis not present

## 2023-11-21 DIAGNOSIS — Z139 Encounter for screening, unspecified: Secondary | ICD-10-CM | POA: Diagnosis not present

## 2023-11-21 DIAGNOSIS — Z86711 Personal history of pulmonary embolism: Secondary | ICD-10-CM | POA: Diagnosis not present

## 2023-11-21 DIAGNOSIS — Z9181 History of falling: Secondary | ICD-10-CM | POA: Diagnosis not present

## 2023-11-21 DIAGNOSIS — B029 Zoster without complications: Secondary | ICD-10-CM | POA: Diagnosis not present

## 2023-11-21 DIAGNOSIS — E7800 Pure hypercholesterolemia, unspecified: Secondary | ICD-10-CM | POA: Diagnosis not present

## 2023-11-21 DIAGNOSIS — I10 Essential (primary) hypertension: Secondary | ICD-10-CM | POA: Diagnosis not present

## 2023-11-21 DIAGNOSIS — Z8673 Personal history of transient ischemic attack (TIA), and cerebral infarction without residual deficits: Secondary | ICD-10-CM | POA: Diagnosis not present

## 2024-10-15 ENCOUNTER — Ambulatory Visit: Admitting: Neurology
